# Patient Record
Sex: Female | Born: 1980 | Race: Black or African American | Hispanic: No | Marital: Single | Smoking: Current every day smoker
Health system: Southern US, Community
[De-identification: ages and names within clinical notes are randomized; demographics above are authoritative.]

## PROBLEM LIST (undated history)

## (undated) DIAGNOSIS — D649 Anemia, unspecified: Secondary | ICD-10-CM

## (undated) DIAGNOSIS — N390 Urinary tract infection, site not specified: Secondary | ICD-10-CM

## (undated) DIAGNOSIS — R87619 Unspecified abnormal cytological findings in specimens from cervix uteri: Secondary | ICD-10-CM

## (undated) DIAGNOSIS — B019 Varicella without complication: Secondary | ICD-10-CM

## (undated) HISTORY — DX: Varicella without complication: B01.9

## (undated) HISTORY — DX: Urinary tract infection, site not specified: N39.0

## (undated) HISTORY — PX: WISDOM TOOTH EXTRACTION: SHX21

## (undated) HISTORY — DX: Unspecified abnormal cytological findings in specimens from cervix uteri: R87.619

---
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
## 1999-07-29 ENCOUNTER — Inpatient Hospital Stay (HOSPITAL_COMMUNITY): Admission: AD | Admit: 1999-07-29 | Discharge: 1999-07-29 | Payer: Self-pay | Admitting: Obstetrics

## 2000-03-30 ENCOUNTER — Emergency Department (HOSPITAL_COMMUNITY): Admission: EM | Admit: 2000-03-30 | Discharge: 2000-03-30 | Payer: Self-pay | Admitting: *Deleted

## 2000-03-30 ENCOUNTER — Emergency Department (HOSPITAL_COMMUNITY): Admission: EM | Admit: 2000-03-30 | Discharge: 2000-03-30 | Payer: Self-pay | Admitting: Emergency Medicine

## 2000-05-24 ENCOUNTER — Emergency Department (HOSPITAL_COMMUNITY): Admission: EM | Admit: 2000-05-24 | Discharge: 2000-05-24 | Payer: Self-pay | Admitting: Emergency Medicine

## 2000-05-26 ENCOUNTER — Inpatient Hospital Stay (HOSPITAL_COMMUNITY): Admission: AD | Admit: 2000-05-26 | Discharge: 2000-05-26 | Payer: Self-pay | Admitting: *Deleted

## 2000-06-30 ENCOUNTER — Inpatient Hospital Stay (HOSPITAL_COMMUNITY): Admission: AD | Admit: 2000-06-30 | Discharge: 2000-06-30 | Payer: Self-pay | Admitting: Obstetrics and Gynecology

## 2000-07-02 ENCOUNTER — Encounter: Payer: Self-pay | Admitting: Obstetrics and Gynecology

## 2000-07-02 ENCOUNTER — Ambulatory Visit (HOSPITAL_COMMUNITY): Admission: RE | Admit: 2000-07-02 | Discharge: 2000-07-02 | Payer: Self-pay | Admitting: Obstetrics and Gynecology

## 2000-08-01 ENCOUNTER — Inpatient Hospital Stay (HOSPITAL_COMMUNITY): Admission: AD | Admit: 2000-08-01 | Discharge: 2000-08-01 | Payer: Self-pay | Admitting: Obstetrics and Gynecology

## 2000-08-21 ENCOUNTER — Encounter: Payer: Self-pay | Admitting: Obstetrics and Gynecology

## 2000-08-21 ENCOUNTER — Ambulatory Visit (HOSPITAL_COMMUNITY): Admission: RE | Admit: 2000-08-21 | Discharge: 2000-08-21 | Payer: Self-pay | Admitting: Obstetrics and Gynecology

## 2000-09-15 HISTORY — PX: DILATION AND CURETTAGE OF UTERUS: SHX78

## 2001-05-01 ENCOUNTER — Inpatient Hospital Stay (HOSPITAL_COMMUNITY): Admission: AD | Admit: 2001-05-01 | Discharge: 2001-05-01 | Payer: Self-pay | Admitting: Obstetrics and Gynecology

## 2001-05-10 ENCOUNTER — Inpatient Hospital Stay (HOSPITAL_COMMUNITY): Admission: AD | Admit: 2001-05-10 | Discharge: 2001-05-10 | Payer: Self-pay | Admitting: Obstetrics and Gynecology

## 2001-12-31 ENCOUNTER — Inpatient Hospital Stay (HOSPITAL_COMMUNITY): Admission: AD | Admit: 2001-12-31 | Discharge: 2002-01-02 | Payer: Self-pay | Admitting: Obstetrics and Gynecology

## 2005-08-15 ENCOUNTER — Emergency Department: Payer: Self-pay | Admitting: Unknown Physician Specialty

## 2005-08-16 ENCOUNTER — Emergency Department: Payer: Self-pay | Admitting: Emergency Medicine

## 2006-03-15 ENCOUNTER — Emergency Department: Payer: Self-pay | Admitting: Internal Medicine

## 2006-09-17 ENCOUNTER — Emergency Department (HOSPITAL_COMMUNITY): Admission: EM | Admit: 2006-09-17 | Discharge: 2006-09-17 | Payer: Self-pay | Admitting: Emergency Medicine

## 2006-10-15 ENCOUNTER — Emergency Department: Payer: Self-pay | Admitting: Emergency Medicine

## 2008-02-25 ENCOUNTER — Emergency Department (HOSPITAL_COMMUNITY): Admission: EM | Admit: 2008-02-25 | Discharge: 2008-02-25 | Payer: Self-pay | Admitting: Emergency Medicine

## 2008-05-30 ENCOUNTER — Emergency Department (HOSPITAL_COMMUNITY): Admission: EM | Admit: 2008-05-30 | Discharge: 2008-05-30 | Payer: Self-pay | Admitting: Emergency Medicine

## 2009-01-17 ENCOUNTER — Emergency Department (HOSPITAL_COMMUNITY): Admission: EM | Admit: 2009-01-17 | Discharge: 2009-01-17 | Payer: Self-pay | Admitting: Emergency Medicine

## 2009-04-11 ENCOUNTER — Emergency Department (HOSPITAL_COMMUNITY): Admission: EM | Admit: 2009-04-11 | Discharge: 2009-04-12 | Payer: Self-pay | Admitting: Emergency Medicine

## 2010-05-18 ENCOUNTER — Emergency Department (HOSPITAL_COMMUNITY): Admission: EM | Admit: 2010-05-18 | Discharge: 2010-05-18 | Payer: Self-pay | Admitting: Emergency Medicine

## 2010-11-28 LAB — CBC
MCH: 23.4 pg — ABNORMAL LOW (ref 26.0–34.0)
Platelets: 365 10*3/uL (ref 150–400)
RBC: 4.7 MIL/uL (ref 3.87–5.11)
RDW: 15.4 % (ref 11.5–15.5)

## 2010-11-28 LAB — GC/CHLAMYDIA PROBE AMP, GENITAL
Chlamydia, DNA Probe: NEGATIVE
GC Probe Amp, Genital: NEGATIVE

## 2010-11-28 LAB — URINALYSIS, ROUTINE W REFLEX MICROSCOPIC
Bilirubin Urine: NEGATIVE
Glucose, UA: NEGATIVE mg/dL
Ketones, ur: NEGATIVE mg/dL
Nitrite: NEGATIVE
Protein, ur: NEGATIVE mg/dL
Specific Gravity, Urine: 1.024 (ref 1.005–1.030)
Urobilinogen, UA: 0.2 mg/dL (ref 0.0–1.0)
pH: 5.5 (ref 5.0–8.0)

## 2010-11-28 LAB — URINE MICROSCOPIC-ADD ON

## 2010-11-28 LAB — DIFFERENTIAL
Basophils Absolute: 0.1 10*3/uL (ref 0.0–0.1)
Basophils Relative: 1 % (ref 0–1)
Eosinophils Absolute: 0.6 10*3/uL (ref 0.0–0.7)
Lymphs Abs: 2.8 10*3/uL (ref 0.7–4.0)
Neutrophils Relative %: 62 % (ref 43–77)

## 2010-11-28 LAB — WET PREP, GENITAL
Trich, Wet Prep: NONE SEEN
Yeast Wet Prep HPF POC: NONE SEEN

## 2010-11-28 LAB — URINE CULTURE: Culture  Setup Time: 201109041144

## 2010-12-22 LAB — URINE MICROSCOPIC-ADD ON

## 2010-12-22 LAB — URINALYSIS, ROUTINE W REFLEX MICROSCOPIC
Glucose, UA: NEGATIVE mg/dL
Ketones, ur: NEGATIVE mg/dL
Protein, ur: 30 mg/dL — AB
pH: 5.5 (ref 5.0–8.0)

## 2010-12-22 LAB — DIFFERENTIAL
Basophils Relative: 0 % (ref 0–1)
Eosinophils Absolute: 0.4 10*3/uL (ref 0.0–0.7)
Lymphs Abs: 1.7 10*3/uL (ref 0.7–4.0)
Monocytes Absolute: 0.4 10*3/uL (ref 0.1–1.0)
Monocytes Relative: 6 % (ref 3–12)

## 2010-12-22 LAB — CBC
Hemoglobin: 13.1 g/dL (ref 12.0–15.0)
MCHC: 33.1 g/dL (ref 30.0–36.0)
MCV: 83.6 fL (ref 78.0–100.0)
RBC: 4.74 MIL/uL (ref 3.87–5.11)
WBC: 6.1 10*3/uL (ref 4.0–10.5)

## 2010-12-22 LAB — BASIC METABOLIC PANEL
CO2: 26 mEq/L (ref 19–32)
Chloride: 107 mEq/L (ref 96–112)
GFR calc Af Amer: >60 mL/min (ref >60)
Potassium: 3.6 mEq/L (ref 3.5–5.1)
Sodium: 137 mEq/L (ref 135–145)

## 2010-12-24 LAB — URINALYSIS, ROUTINE W REFLEX MICROSCOPIC
Bilirubin Urine: NEGATIVE
Ketones, ur: 15 mg/dL — AB
Leukocytes, UA: NEGATIVE
Nitrite: NEGATIVE
Protein, ur: 30 mg/dL — AB
Urobilinogen, UA: 0.2 mg/dL (ref 0.0–1.0)

## 2010-12-24 LAB — GC/CHLAMYDIA PROBE AMP, GENITAL: GC Probe Amp, Genital: NEGATIVE

## 2010-12-24 LAB — WET PREP, GENITAL
Trich, Wet Prep: NONE SEEN
Yeast Wet Prep HPF POC: NONE SEEN

## 2010-12-24 LAB — POCT I-STAT, CHEM 8
Chloride: 108 mEq/L (ref 96–112)
Creatinine, Ser: 0.8 mg/dL (ref 0.4–1.2)
HCT: 38 % (ref 36.0–46.0)
Hemoglobin: 12.9 g/dL (ref 12.0–15.0)
Potassium: 3.5 mEq/L (ref 3.5–5.1)
Sodium: 143 mEq/L (ref 135–145)

## 2010-12-24 LAB — PROTIME-INR
INR: 1.1 (ref 0.00–1.49)
Prothrombin Time: 14.2 seconds (ref 11.6–15.2)

## 2010-12-24 LAB — CBC
HCT: 39.4 % (ref 36.0–46.0)
Platelets: 273 10*3/uL (ref 150–400)
RDW: 15.6 % — ABNORMAL HIGH (ref 11.5–15.5)
WBC: 8.7 10*3/uL (ref 4.0–10.5)

## 2010-12-24 LAB — APTT: aPTT: 27 seconds (ref 24–37)

## 2010-12-24 LAB — HCG, QUANTITATIVE, PREGNANCY: hCG, Beta Chain, Quant, S: <2 m[IU]/mL (ref <5)

## 2010-12-24 LAB — POCT PREGNANCY, URINE: Preg Test, Ur: NEGATIVE

## 2010-12-24 LAB — TYPE AND SCREEN: Antibody Screen: NEGATIVE

## 2010-12-24 LAB — DIFFERENTIAL
Basophils Absolute: 0.1 10*3/uL (ref 0.0–0.1)
Lymphocytes Relative: 18 % (ref 12–46)
Lymphs Abs: 1.6 10*3/uL (ref 0.7–4.0)
Neutro Abs: 6.2 10*3/uL (ref 1.7–7.7)

## 2010-12-24 LAB — URINE MICROSCOPIC-ADD ON

## 2011-01-31 NOTE — H&P (Signed)
[facility]  Patient:    Virginia Bird, Virginia Bird Visit Number: [id number] MRN: [id number]          Service Type: OBS Location: [id number] Attending Physician:  Michael Litter Dictated by:   Wynelle Bourgeois, CNM Admit Date:  12/31/2001                           History and Physical  HISTORY OF PRESENT ILLNESS:   This is a 30 year old gravida 2, para 0-0-1-0, who presents at [redacted] weeks gestation with uterine contractions reportedly every six minutes for about two hours.  She denies leaking or bleeding and reports positive fetal movement.  Pregnancy has been followed by the MD service and remarkable for history of a 17 week loss and first trimester bleeding.  OB HISTORY:                   Remarkable for a pregnancy in 2001 at 17 weeks which was a twin gestation where the first twin died in utero in the first trimester and the second twin died at 78 weeks and delivered spontaneously later.  She has no other pregnancy history.  PRENATAL LABORATORY DATA:     Blood type B-positive.  Antibody screen negative.  Hemoglobin 10.7.  Sickle cell negative.  RPR nonreactive.  Rubella immune.  HBsAg negative.  Pap test normal.  Gonorrhea negative.  Chlamydia negative.  Glucose challenge within normal limits.  Group B strep positive.  PAST MEDICAL HISTORY:         Remarkable for history of Chlamydia in 2000, which was treated.  History of childhood Varicella.  FAMILY HISTORY:               Remarkable for maternal grandmother with hypertension.  Father with diabetes.  Mother with thyroid dysfunction and father with leukemia.  GENETIC HISTORY:              Remarkable for loss of a 17 week twin pregnancy.  SOCIAL HISTORY:               The patient is single.  The patient is accompanied by a female tonight.  Father of the baby is not present at this time.  She denies any alcohol, tobacco, or drug use.  PHYSICAL EXAMINATION:  VITAL SIGNS:                  Stable  and afebrile.  HEENT:                        Within normal limits.  NECK:                         Thyroid normal, not enlarged.  BREASTS:                      Soft and nontender.  CHEST:                        Clear to auscultation.  HEART:                        Regular rate and rhythm.  ABDOMEN:                      Gravid and 40 cm, vertex to [place].  EFM showed  an initial fetal heart rate of 80 upon admission x1 minute which later resolved to the 130s with small accelerations to 140s and uterine contractions every two to three minutes.  Some mild and some stronger.  CERVICAL EXAMINATION:         Three to four centimeters, 80% effaced, -1 station, with a vertex presentation.  EXTREMITIES:                  Within normal limits.  ASSESSMENT:                   1. Intrauterine pregnancy at term.                               2. Early active labor.                               3. Group B streptococcus positive.                               4. Variable deceleration.  PLAN:                         1. Admit to birthing suite, Dr. Normand Sloop                                  notified.                               2. Routine MD orders.                               3. Group B strep prophylaxis. Dictated by:   Wynelle Bourgeois, CNM Attending Physician:  Michael Litter DD:  12/31/01 TD:  12/31/01 Job: [id number]

## 2011-01-31 NOTE — H&P (Signed)
[facility]  Patient:    Virginia Bird, Virginia Bird                   MRN: [id number] Adm. Date:  [id number] Disc. Date: [id number] Attending:  Maxie Better                         History and Physical  HISTORY OF PRESENT ILLNESS:   The patient is a 30 year old female, gravida 1, para 0, who presents at 19 weeks and 5 days gestation (EDC is Jan 26, 2001) based on her last menstrual period.  The patient is known to have twins.  She has had two ultrasounds which confirm intrauterine fetal demise of both twins.  PAST MEDICAL HISTORY:         The patient denies hypertension and diabetes.  DRUG ALLERGIES:               None.  SOCIAL HISTORY:               The patient is single, and she works for [employer].  She denies cigarette use, alcohol use, and recreational drug use.  FAMILY HISTORY:               The patients father has diabetes.  The patients mother had thyroid dysfunction.  The patients father had leukemia.  REVIEW OF SYSTEMS:            Noncontributory.  PHYSICAL EXAMINATION:  HEENT:                        Within normal limits.  CHEST:                        Clear.  HEART:                        Regular rate and rhythm.  BREASTS:                      Without masses.  ABDOMEN:                      Nontender.  EXTREMITIES:                  Within normal limits.  NEUROLOGIC:                   Normal.  PELVIC:                       External genitalia is normal.  The vagina is normal.  Cervix is nontender.  Uterus is 16 to 18-week size and nontender. Adnexa: No masses appreciated.  ASSESSMENT:                   Intrauterine pregnancy fetal demise of a twin gestation measuring approximately 15-1/2 weeks with each twin.  PLAN:                         The patient will come in for prostaglandin induction of delivery.  She understands the indications for her procedure, and she accepts the risks. DD:  08/28/00 TD:  08/28/00 Job:  [id number]

## 2011-06-16 LAB — URINALYSIS, ROUTINE W REFLEX MICROSCOPIC
Bilirubin Urine: NEGATIVE
Glucose, UA: NEGATIVE
Ketones, ur: NEGATIVE
pH: 5.5

## 2011-06-16 LAB — URINE MICROSCOPIC-ADD ON

## 2011-06-16 LAB — POCT PREGNANCY, URINE: Preg Test, Ur: NEGATIVE

## 2012-01-27 ENCOUNTER — Encounter: Payer: Self-pay | Admitting: Family Medicine

## 2012-01-27 ENCOUNTER — Other Ambulatory Visit (HOSPITAL_COMMUNITY)
Admission: RE | Admit: 2012-01-27 | Discharge: 2012-01-27 | Disposition: A | Discharge status: Home / Self Care | Payer: Medicaid Other | Source: Ambulatory Visit | Attending: Family Medicine | Admitting: Family Medicine

## 2012-01-27 ENCOUNTER — Ambulatory Visit (INDEPENDENT_AMBULATORY_CARE_PROVIDER_SITE_OTHER): Payer: Medicaid Other | Admitting: Family Medicine

## 2012-01-27 VITALS — BP 114/83 | HR 110 | Ht 61.0 in | Wt 155.0 lb

## 2012-01-27 DIAGNOSIS — R87810 Cervical high risk human papillomavirus (HPV) DNA test positive: Secondary | ICD-10-CM | POA: Insufficient documentation

## 2012-01-27 DIAGNOSIS — Z01419 Encounter for gynecological examination (general) (routine) without abnormal findings: Secondary | ICD-10-CM | POA: Insufficient documentation

## 2012-01-27 DIAGNOSIS — N83209 Unspecified ovarian cyst, unspecified side: Secondary | ICD-10-CM | POA: Insufficient documentation

## 2012-01-27 DIAGNOSIS — N852 Hypertrophy of uterus: Secondary | ICD-10-CM

## 2012-01-27 MED ORDER — NORETHINDRONE-ETH ESTRADIOL 0.4-35 MG-MCG PO TABS: 1.0000 | ORAL_TABLET | Freq: Every day | ORAL | Status: DC

## 2012-01-27 NOTE — Progress Notes (Signed)
  Subjective:     Virginia Bird is a 31 y.o. female and is here for a comprehensive physical exam. The patient reports problems - reports ? ovarian cysts with abd. pain and swelling.  Also in need of contraception.  Reports normal monthly cycles..  History   Social History  . Marital Status: Single    Spouse Name: N/A    Number of Children: N/A  . Years of Education: N/A   Occupational History  . Not on file.   Social History Main Topics  . Smoking status: Current Everyday Smoker -- 0.5 packs/day for 10 years  . Smokeless tobacco: Never Used   Comment: Has not tried any quit aids/tn  . Alcohol Use: Yes     weekends  . Drug Use: No  . Sexually Active: Yes -- Female partner(s)    Birth Control/ Protection: Condom   Other Topics Concern  . Not on file   Social History Narrative  . No narrative on file   No health maintenance topics applied.  The following portions of the patient's history were reviewed and updated as appropriate: allergies, current medications, past family history, past medical history, past social history, past surgical history and problem list.  Review of Systems A comprehensive review of systems was negative.   Objective:    BP 114/83  Pulse 110  Ht 5\' 1"  (1.549 m)  Wt 155 lb (70.308 kg)  BMI 29.29 kg/m2  LMP 01/19/2012 General appearance: alert and cooperative Head: Normocephalic, without obvious abnormality, atraumatic Neck: no adenopathy, supple, symmetrical, trachea midline and thyroid not enlarged, symmetric, no tenderness/mass/nodules Lungs: clear to auscultation bilaterally Breasts: normal appearance, no masses or tenderness Heart: regular rate and rhythm, S1, S2 normal, no murmur, click, rub or gallop Abdomen: soft, non-tender; bowel sounds normal; no masses,  no organomegaly Pelvic: cervix normal in appearance, external genitalia normal, no adnexal masses or tenderness, no cervical motion tenderness, vagina normal without discharge and  uterus enlarged and firm approx. 10-12 wk size Extremities: extremities normal, atraumatic, no cyanosis or edema Pulses: 2+ and symmetric Skin: Skin color, texture, turgor normal. No rashes or lesions Lymph nodes: Cervical, supraclavicular, and axillary nodes normal. Neurologic: Grossly normal    Assessment:    Healthy female exam. Enlarged uterus, needs contraception, h/o ovarian cysts     Plan:  Schedule u/s to look at uterus, given size. Ovcon for contraception and control of cysts. Pap smear today  See After Visit Summary for Counseling Recommendations

## 2012-01-27 NOTE — Progress Notes (Signed)
Patient is here to for yearly exam and would like to talk about ovarian cysts.

## 2012-01-27 NOTE — Patient Instructions (Signed)
Preventive Care for Adults, Female A healthy lifestyle and preventive care can promote health and wellness. Preventive health guidelines for women include the following key practices.  A routine yearly physical is a good way to check with your caregiver about your health and preventive screening. It is a chance to share any concerns and updates on your health, and to receive a thorough exam.   Visit your dentist for a routine exam and preventive care every 6 months. Brush your teeth twice a day and floss once a day. Good oral hygiene prevents tooth decay and gum disease.   The frequency of eye exams is based on your age, health, family medical history, use of contact lenses, and other factors. Follow your caregiver's recommendations for frequency of eye exams.   Eat a healthy diet. Foods like vegetables, fruits, whole grains, low-fat dairy products, and lean protein foods contain the nutrients you need without too many calories. Decrease your intake of foods high in solid fats, added sugars, and salt. Eat the right amount of calories for you.Get information about a proper diet from your caregiver, if necessary.   Regular physical exercise is one of the most important things you can do for your health. Most adults should get at least 150 minutes of moderate-intensity exercise (any activity that increases your heart rate and causes you to sweat) each week. In addition, most adults need muscle-strengthening exercises on 2 or more days a week.   Maintain a healthy weight. The body mass index (BMI) is a screening tool to identify possible weight problems. It provides an estimate of body fat based on height and weight. Your caregiver can help determine your BMI, and can help you achieve or maintain a healthy weight.For adults 20 years and older:   A BMI below 18.5 is considered underweight.   A BMI of 18.5 to 24.9 is normal.   A BMI of 25 to 29.9 is considered overweight.   A BMI of 30 and above is  considered obese.   Maintain normal blood lipids and cholesterol levels by exercising and minimizing your intake of saturated fat. Eat a balanced diet with plenty of fruit and vegetables. Blood tests for lipids and cholesterol should begin at age 20 and be repeated every 5 years. If your lipid or cholesterol levels are high, you are over 50, or you are at high risk for heart disease, you may need your cholesterol levels checked more frequently.Ongoing high lipid and cholesterol levels should be treated with medicines if diet and exercise are not effective.   If you smoke, find out from your caregiver how to quit. If you do not use tobacco, do not start.   If you are pregnant, do not drink alcohol. If you are breastfeeding, be very cautious about drinking alcohol. If you are not pregnant and choose to drink alcohol, do not exceed 1 drink per day. One drink is considered to be 12 ounces (355 mL) of beer, 5 ounces (148 mL) of wine, or 1.5 ounces (44 mL) of liquor.   Avoid use of street drugs. Do not share needles with anyone. Ask for help if you need support or instructions about stopping the use of drugs.   High blood pressure causes heart disease and increases the risk of stroke. Your blood pressure should be checked at least every 1 to 2 years. Ongoing high blood pressure should be treated with medicines if weight loss and exercise are not effective.   If you are 55 to 33   years old, ask your caregiver if you should take aspirin to prevent strokes.   Diabetes screening involves taking a blood sample to check your fasting blood sugar level. This should be done once every 3 years, after age 45, if you are within normal weight and without risk factors for diabetes. Testing should be considered at a younger age or be carried out more frequently if you are overweight and have at least 1 risk factor for diabetes.   Breast cancer screening is essential preventive care for women. You should practice "breast  self-awareness." This means understanding the normal appearance and feel of your breasts and may include breast self-examination. Any changes detected, no matter how small, should be reported to a caregiver. Women in their 20s and 30s should have a clinical breast exam (CBE) by a caregiver as part of a regular health exam every 1 to 3 years. After age 40, women should have a CBE every year. Starting at age 40, women should consider having a mammography (breast X-ray test) every year. Women who have a family history of breast cancer should talk to their caregiver about genetic screening. Women at a high risk of breast cancer should talk to their caregivers about having magnetic resonance imaging (MRI) and a mammography every year.   The Pap test is a screening test for cervical cancer. A Pap test can show cell changes on the cervix that might become cervical cancer if left untreated. A Pap test is a procedure in which cells are obtained and examined from the lower end of the uterus (cervix).   Women should have a Pap test starting at age 21.   Between ages 21 and 29, Pap tests should be repeated every 2 years.   Beginning at age 30, you should have a Pap test every 3 years as long as the past 3 Pap tests have been normal.   Some women have medical problems that increase the chance of getting cervical cancer. Talk to your caregiver about these problems. It is especially important to talk to your caregiver if a new problem develops soon after your last Pap test. In these cases, your caregiver may recommend more frequent screening and Pap tests.   The above recommendations are the same for women who have or have not gotten the vaccine for human papillomavirus (HPV).   If you had a hysterectomy for a problem that was not cancer or a condition that could lead to cancer, then you no longer need Pap tests. Even if you no longer need a Pap test, a regular exam is a good idea to make sure no other problems are  starting.   If you are between ages 65 and 70, and you have had normal Pap tests going back 10 years, you no longer need Pap tests. Even if you no longer need a Pap test, a regular exam is a good idea to make sure no other problems are starting.   If you have had past treatment for cervical cancer or a condition that could lead to cancer, you need Pap tests and screening for cancer for at least 20 years after your treatment.   If Pap tests have been discontinued, risk factors (such as a new sexual partner) need to be reassessed to determine if screening should be resumed.   The HPV test is an additional test that may be used for cervical cancer screening. The HPV test looks for the virus that can cause the cell changes on the cervix.   The cells collected during the Pap test can be tested for HPV. The HPV test could be used to screen women aged 30 years and older, and should be used in women of any age who have unclear Pap test results. After the age of 30, women should have HPV testing at the same frequency as a Pap test.   Colorectal cancer can be detected and often prevented. Most routine colorectal cancer screening begins at the age of 50 and continues through age 75. However, your caregiver may recommend screening at an earlier age if you have risk factors for colon cancer. On a yearly basis, your caregiver may provide home test kits to check for hidden blood in the stool. Use of a small camera at the end of a tube, to directly examine the colon (sigmoidoscopy or colonoscopy), can detect the earliest forms of colorectal cancer. Talk to your caregiver about this at age 50, when routine screening begins. Direct examination of the colon should be repeated every 5 to 10 years through age 75, unless early forms of pre-cancerous polyps or small growths are found.   Hepatitis C blood testing is recommended for all people born from 1945 through 1965 and any individual with known risks for hepatitis C.    Practice safe sex. Use condoms and avoid high-risk sexual practices to reduce the spread of sexually transmitted infections (STIs). STIs include gonorrhea, chlamydia, syphilis, trichomonas, herpes, HPV, and human immunodeficiency virus (HIV). Herpes, HIV, and HPV are viral illnesses that have no cure. They can result in disability, cancer, and death. Sexually active women aged 25 and younger should be checked for chlamydia. Older women with new or multiple partners should also be tested for chlamydia. Testing for other STIs is recommended if you are sexually active and at increased risk.   Osteoporosis is a disease in which the bones lose minerals and strength with aging. This can result in serious bone fractures. The risk of osteoporosis can be identified using a bone density scan. Women ages 65 and over and women at risk for fractures or osteoporosis should discuss screening with their caregivers. Ask your caregiver whether you should take a calcium supplement or vitamin D to reduce the rate of osteoporosis.   Menopause can be associated with physical symptoms and risks. Hormone replacement therapy is available to decrease symptoms and risks. You should talk to your caregiver about whether hormone replacement therapy is right for you.   Use sunscreen with sun protection factor (SPF) of 30 or more. Apply sunscreen liberally and repeatedly throughout the day. You should seek shade when your shadow is shorter than you. Protect yourself by wearing long sleeves, pants, a wide-brimmed hat, and sunglasses year round, whenever you are outdoors.   Once a month, do a whole body skin exam, using a mirror to look at the skin on your back. Notify your caregiver of new moles, moles that have irregular borders, moles that are larger than a pencil eraser, or moles that have changed in shape or color.   Stay current with required immunizations.   Influenza. You need a dose every fall (or winter). The composition of  the flu vaccine changes each year, so being vaccinated once is not enough.   Pneumococcal polysaccharide. You need 1 to 2 doses if you smoke cigarettes or if you have certain chronic medical conditions. You need 1 dose at age 65 (or older) if you have never been vaccinated.   Tetanus, diphtheria, pertussis (Tdap, Td). Get 1 dose of   Tdap vaccine if you are younger than age 65, are over 65 and have contact with an infant, are a healthcare worker, are pregnant, or simply want to be protected from whooping cough. After that, you need a Td booster dose every 10 years. Consult your caregiver if you have not had at least 3 tetanus and diphtheria-containing shots sometime in your life or have a deep or dirty wound.   HPV. You need this vaccine if you are a woman age 26 or younger. The vaccine is given in 3 doses over 6 months.   Measles, mumps, rubella (MMR). You need at least 1 dose of MMR if you were born in 1957 or later. You may also need a second dose.   Meningococcal. If you are age 19 to 21 and a first-year college student living in a residence hall, or have one of several medical conditions, you need to get vaccinated against meningococcal disease. You may also need additional booster doses.   Zoster (shingles). If you are age 60 or older, you should get this vaccine.   Varicella (chickenpox). If you have never had chickenpox or you were vaccinated but received only 1 dose, talk to your caregiver to find out if you need this vaccine.   Hepatitis A. You need this vaccine if you have a specific risk factor for hepatitis A virus infection or you simply wish to be protected from this disease. The vaccine is usually given as 2 doses, 6 to 18 months apart.   Hepatitis B. You need this vaccine if you have a specific risk factor for hepatitis B virus infection or you simply wish to be protected from this disease. The vaccine is given in 3 doses, usually over 6 months.  Preventive Services /  Frequency Ages 19 to 39  Blood pressure check.** / Every 1 to 2 years.   Lipid and cholesterol check.** / Every 5 years beginning at age 20.   Clinical breast exam.** / Every 3 years for women in their 20s and 30s.   Pap test.** / Every 2 years from ages 21 through 29. Every 3 years starting at age 30 through age 65 or 70 with a history of 3 consecutive normal Pap tests.   HPV screening.** / Every 3 years from ages 30 through ages 65 to 70 with a history of 3 consecutive normal Pap tests.   Hepatitis C blood test.** / For any individual with known risks for hepatitis C.   Skin self-exam. / Monthly.   Influenza immunization.** / Every year.   Pneumococcal polysaccharide immunization.** / 1 to 2 doses if you smoke cigarettes or if you have certain chronic medical conditions.   Tetanus, diphtheria, pertussis (Tdap, Td) immunization. / A one-time dose of Tdap vaccine. After that, you need a Td booster dose every 10 years.   HPV immunization. / 3 doses over 6 months, if you are 26 and younger.   Measles, mumps, rubella (MMR) immunization. / You need at least 1 dose of MMR if you were born in 1957 or later. You may also need a second dose.   Meningococcal immunization. / 1 dose if you are age 19 to 21 and a first-year college student living in a residence hall, or have one of several medical conditions, you need to get vaccinated against meningococcal disease. You may also need additional booster doses.   Varicella immunization.** / Consult your caregiver.   Hepatitis A immunization.** / Consult your caregiver. 2 doses, 6 to 18 months   apart.   Hepatitis B immunization.** / Consult your caregiver. 3 doses usually over 6 months.  Ages 40 to 64  Blood pressure check.** / Every 1 to 2 years.   Lipid and cholesterol check.** / Every 5 years beginning at age 20.   Clinical breast exam.** / Every year after age 40.   Mammogram.** / Every year beginning at age 40 and continuing for as  long as you are in good health. Consult with your caregiver.   Pap test.** / Every 3 years starting at age 30 through age 65 or 70 with a history of 3 consecutive normal Pap tests.   HPV screening.** / Every 3 years from ages 30 through ages 65 to 70 with a history of 3 consecutive normal Pap tests.   Fecal occult blood test (FOBT) of stool. / Every year beginning at age 50 and continuing until age 75. You may not need to do this test if you get a colonoscopy every 10 years.   Flexible sigmoidoscopy or colonoscopy.** / Every 5 years for a flexible sigmoidoscopy or every 10 years for a colonoscopy beginning at age 50 and continuing until age 75.   Hepatitis C blood test.** / For all people born from 1945 through 1965 and any individual with known risks for hepatitis C.   Skin self-exam. / Monthly.   Influenza immunization.** / Every year.   Pneumococcal polysaccharide immunization.** / 1 to 2 doses if you smoke cigarettes or if you have certain chronic medical conditions.   Tetanus, diphtheria, pertussis (Tdap, Td) immunization.** / A one-time dose of Tdap vaccine. After that, you need a Td booster dose every 10 years.   Measles, mumps, rubella (MMR) immunization. / You need at least 1 dose of MMR if you were born in 1957 or later. You may also need a second dose.   Varicella immunization.** / Consult your caregiver.   Meningococcal immunization.** / Consult your caregiver.   Hepatitis A immunization.** / Consult your caregiver. 2 doses, 6 to 18 months apart.   Hepatitis B immunization.** / Consult your caregiver. 3 doses, usually over 6 months.  Ages 65 and over  Blood pressure check.** / Every 1 to 2 years.   Lipid and cholesterol check.** / Every 5 years beginning at age 20.   Clinical breast exam.** / Every year after age 40.   Mammogram.** / Every year beginning at age 40 and continuing for as long as you are in good health. Consult with your caregiver.   Pap test.** /  Every 3 years starting at age 30 through age 65 or 70 with a 3 consecutive normal Pap tests. Testing can be stopped between 65 and 70 with 3 consecutive normal Pap tests and no abnormal Pap or HPV tests in the past 10 years.   HPV screening.** / Every 3 years from ages 30 through ages 65 or 70 with a history of 3 consecutive normal Pap tests. Testing can be stopped between 65 and 70 with 3 consecutive normal Pap tests and no abnormal Pap or HPV tests in the past 10 years.   Fecal occult blood test (FOBT) of stool. / Every year beginning at age 50 and continuing until age 75. You may not need to do this test if you get a colonoscopy every 10 years.   Flexible sigmoidoscopy or colonoscopy.** / Every 5 years for a flexible sigmoidoscopy or every 10 years for a colonoscopy beginning at age 50 and continuing until age 75.   Hepatitis   C blood test.** / For all people born from 1945 through 1965 and any individual with known risks for hepatitis C.   Osteoporosis screening.** / A one-time screening for women ages 65 and over and women at risk for fractures or osteoporosis.   Skin self-exam. / Monthly.   Influenza immunization.** / Every year.   Pneumococcal polysaccharide immunization.** / 1 dose at age 65 (or older) if you have never been vaccinated.   Tetanus, diphtheria, pertussis (Tdap, Td) immunization. / A one-time dose of Tdap vaccine if you are over 65 and have contact with an infant, are a healthcare worker, or simply want to be protected from whooping cough. After that, you need a Td booster dose every 10 years.   Varicella immunization.** / Consult your caregiver.   Meningococcal immunization.** / Consult your caregiver.   Hepatitis A immunization.** / Consult your caregiver. 2 doses, 6 to 18 months apart.   Hepatitis B immunization.** / Check with your caregiver. 3 doses, usually over 6 months.  ** Family history and personal history of risk and conditions may change your caregiver's  recommendations. Document Released: 10/28/2001 Document Revised: 08/21/2011 Document Reviewed: 01/27/2011 Exit[employer] Patient Information 2012 [employer]. 

## 2012-01-29 ENCOUNTER — Other Ambulatory Visit: Payer: Self-pay | Admitting: Family Medicine

## 2012-01-29 DIAGNOSIS — N852 Hypertrophy of uterus: Secondary | ICD-10-CM

## 2012-02-05 ENCOUNTER — Ambulatory Visit (HOSPITAL_COMMUNITY)
Admission: RE | Admit: 2012-02-05 | Discharge: 2012-02-05 | Disposition: A | Discharge status: Home / Self Care | Payer: Medicaid Other | Source: Ambulatory Visit | Attending: Family Medicine | Admitting: Family Medicine

## 2012-02-05 DIAGNOSIS — D251 Intramural leiomyoma of uterus: Secondary | ICD-10-CM | POA: Insufficient documentation

## 2012-02-05 DIAGNOSIS — N852 Hypertrophy of uterus: Secondary | ICD-10-CM | POA: Insufficient documentation

## 2012-02-25 ENCOUNTER — Ambulatory Visit (INDEPENDENT_AMBULATORY_CARE_PROVIDER_SITE_OTHER): Payer: Medicaid Other | Admitting: Obstetrics and Gynecology

## 2012-02-25 ENCOUNTER — Encounter: Payer: Self-pay | Admitting: Obstetrics and Gynecology

## 2012-02-25 VITALS — BP 116/78 | HR 86 | Ht 62.0 in | Wt 154.0 lb

## 2012-02-25 DIAGNOSIS — IMO0001 Reserved for inherently not codable concepts without codable children: Secondary | ICD-10-CM | POA: Insufficient documentation

## 2012-02-25 DIAGNOSIS — Z01812 Encounter for preprocedural laboratory examination: Secondary | ICD-10-CM

## 2012-02-25 DIAGNOSIS — Z0000 Encounter for general adult medical examination without abnormal findings: Secondary | ICD-10-CM

## 2012-02-25 DIAGNOSIS — Z01419 Encounter for gynecological examination (general) (routine) without abnormal findings: Secondary | ICD-10-CM

## 2012-02-25 DIAGNOSIS — Z113 Encounter for screening for infections with a predominantly sexual mode of transmission: Secondary | ICD-10-CM

## 2012-02-25 DIAGNOSIS — R87610 Atypical squamous cells of undetermined significance on cytologic smear of cervix (ASC-US): Secondary | ICD-10-CM

## 2012-02-25 NOTE — Patient Instructions (Signed)
Colposcopy Care After Colposcopy is a procedure in which a special tool is used to magnify the surface of the cervix. A tissue sample (biopsy) may also be taken. This sample will be looked at for cervical cancer or other problems. After the test:  You may have some cramping.   Lie down for a few minutes if you feel lightheaded.    You may have some bleeding which should stop in a few days.  HOME CARE  Do not have sex or use tampons for 2 to 3 days or as told.   Only take medicine as told by your doctor.   Continue to take your birth control pills as usual.  Finding out the results of your test Ask when your test results will be ready. Make sure you get your test results. GET HELP RIGHT AWAY IF:  You are bleeding a lot or are passing blood clots.   You develop a fever of 100.4 F (38 C) or higher.   You have abnormal vaginal discharge.   You have cramps that do not go away with medicine.   You feel lightheaded, dizzy, or pass out (faint).  MAKE SURE YOU:   Understand these instructions.   Will watch your condition.   Will get help right away if you are not doing well or get worse.  Document Released: 02/18/2008 Document Revised: 08/21/2011 Document Reviewed: 02/18/2008 Exit[employer] Patient Information 2012 [employer]. 

## 2012-02-25 NOTE — Addendum Note (Signed)
Addended by: Barbara Cower on: 02/25/2012 04:56 PM   Modules accepted: Orders

## 2012-02-25 NOTE — Progress Notes (Signed)
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  Placed in lithotomy position. Cervix viewed with speculum and colposcope after application of acetic acid.   Colposcopy adequate?  yes Acetowhite lesions?yes at 12-1 o'clock position Punctation?no Mosaicism?  no Abnormal vasculature?  no Biopsies?yes at 12 o'clock ECC?yes  COMMENTS: Patient was given post procedure instructions.  She will return in 2 weeks for results.

## 2012-02-26 LAB — COMPREHENSIVE METABOLIC PANEL
ALT: 10 U/L (ref 0–35)
AST: 17 U/L (ref 0–37)
Alkaline Phosphatase: 44 U/L (ref 39–117)
Calcium: 9.4 mg/dL (ref 8.4–10.5)
Chloride: 104 mEq/L (ref 96–112)
Creat: 0.8 mg/dL (ref 0.50–1.10)
Potassium: 3.8 mEq/L (ref 3.5–5.3)

## 2012-02-26 LAB — HEPATITIS B SURFACE ANTIGEN: Hepatitis B Surface Ag: NEGATIVE

## 2012-02-26 LAB — LIPID PANEL
HDL: 51 mg/dL (ref >39)
LDL Cholesterol: 58 mg/dL (ref 0–99)
Total CHOL/HDL Ratio: 2.6 Ratio
VLDL: 24 mg/dL (ref 0–40)

## 2012-02-26 LAB — CBC
MCV: 72.7 fL — ABNORMAL LOW (ref 78.0–100.0)
Platelets: 440 10*3/uL — ABNORMAL HIGH (ref 150–400)
RDW: 16.7 % — ABNORMAL HIGH (ref 11.5–15.5)
WBC: 9 10*3/uL (ref 4.0–10.5)

## 2012-02-26 LAB — HIV ANTIBODY (ROUTINE TESTING W REFLEX): HIV: NONREACTIVE

## 2012-03-10 ENCOUNTER — Telehealth: Payer: Self-pay | Admitting: Gynecology

## 2012-03-10 NOTE — Telephone Encounter (Signed)
Spoke to patient regarding her labs result. Patient is aware of result and will start taking iron supplement and recheck her labs in 3 months.

## 2013-07-18 ENCOUNTER — Ambulatory Visit: Payer: Medicaid Other | Admitting: Family Medicine

## 2014-07-17 ENCOUNTER — Encounter: Payer: Self-pay | Admitting: Obstetrics and Gynecology

## 2014-07-19 ENCOUNTER — Ambulatory Visit (INDEPENDENT_AMBULATORY_CARE_PROVIDER_SITE_OTHER): Payer: Medicaid Other | Admitting: Obstetrics & Gynecology

## 2014-07-19 ENCOUNTER — Other Ambulatory Visit (HOSPITAL_COMMUNITY)
Admission: RE | Admit: 2014-07-19 | Discharge: 2014-07-19 | Disposition: A | Discharge status: Home / Self Care | Payer: Medicaid Other | Source: Ambulatory Visit | Attending: Obstetrics & Gynecology | Admitting: Obstetrics & Gynecology

## 2014-07-19 ENCOUNTER — Encounter: Payer: Self-pay | Admitting: Obstetrics & Gynecology

## 2014-07-19 VITALS — BP 109/77 | HR 76 | Ht 61.0 in | Wt 153.0 lb

## 2014-07-19 DIAGNOSIS — Z01411 Encounter for gynecological examination (general) (routine) with abnormal findings: Secondary | ICD-10-CM | POA: Diagnosis present

## 2014-07-19 DIAGNOSIS — Z1329 Encounter for screening for other suspected endocrine disorder: Secondary | ICD-10-CM

## 2014-07-19 DIAGNOSIS — R87810 Cervical high risk human papillomavirus (HPV) DNA test positive: Secondary | ICD-10-CM | POA: Insufficient documentation

## 2014-07-19 DIAGNOSIS — Z113 Encounter for screening for infections with a predominantly sexual mode of transmission: Secondary | ICD-10-CM

## 2014-07-19 DIAGNOSIS — Z130 Encounter for screening for diseases of the blood and blood-forming organs and certain disorders involving the immune mechanism: Secondary | ICD-10-CM

## 2014-07-19 DIAGNOSIS — Z13228 Encounter for screening for other metabolic disorders: Secondary | ICD-10-CM

## 2014-07-19 DIAGNOSIS — Z01419 Encounter for gynecological examination (general) (routine) without abnormal findings: Secondary | ICD-10-CM

## 2014-07-19 DIAGNOSIS — Z30011 Encounter for initial prescription of contraceptive pills: Secondary | ICD-10-CM

## 2014-07-19 DIAGNOSIS — Z13220 Encounter for screening for lipoid disorders: Secondary | ICD-10-CM

## 2014-07-19 DIAGNOSIS — Z1151 Encounter for screening for human papillomavirus (HPV): Secondary | ICD-10-CM | POA: Diagnosis present

## 2014-07-19 DIAGNOSIS — Z124 Encounter for screening for malignant neoplasm of cervix: Secondary | ICD-10-CM

## 2014-07-19 LAB — CBC WITH DIFFERENTIAL/PLATELET
Basophils Absolute: 0.1 10*3/uL (ref 0.0–0.1)
Basophils Relative: 1 % (ref 0–1)
Eosinophils Absolute: 0.2 10*3/uL (ref 0.0–0.7)
Eosinophils Relative: 3 % (ref 0–5)
HCT: 33 % — ABNORMAL LOW (ref 36.0–46.0)
HEMOGLOBIN: 10.6 g/dL — AB (ref 12.0–15.0)
LYMPHS ABS: 2.6 10*3/uL (ref 0.7–4.0)
LYMPHS PCT: 31 % (ref 12–46)
MCH: 25 pg — ABNORMAL LOW (ref 26.0–34.0)
MCHC: 32.1 g/dL (ref 30.0–36.0)
MCV: 77.8 fL — ABNORMAL LOW (ref 78.0–100.0)
Monocytes Absolute: 0.4 10*3/uL (ref 0.1–1.0)
Monocytes Relative: 5 % (ref 3–12)
NEUTROS ABS: 5 10*3/uL (ref 1.7–7.7)
NEUTROS PCT: 60 % (ref 43–77)
PLATELETS: 390 10*3/uL (ref 150–400)
RBC: 4.24 MIL/uL (ref 3.87–5.11)
RDW: 15.4 % (ref 11.5–15.5)
WBC: 8.3 10*3/uL (ref 4.0–10.5)

## 2014-07-19 MED ORDER — NORGESTREL-ETHINYL ESTRADIOL 0.3-30 MG-MCG PO TABS: 1.0000 | ORAL_TABLET | Freq: Every day | ORAL | Status: DC

## 2014-07-19 NOTE — Progress Notes (Signed)
Subjective:    Virginia Bird is a 33 y.o. S AA P1 (33 yo) female who presents for an annual exam. The patient has no complaints today. She is interested in starting OCPs for contraception. She currently uses withdrawal. The patient is sexually active. GYN screening history: was ASCUS with negative colpo in 2013. The patient wears seatbelts: yes. The patient participates in regular exercise: no. Has the patient ever been transfused or tattooed?: no. The patient reports that there is not domestic violence in her life.   Menstrual History: OB History    Gravida Para Term Preterm AB TAB SAB Ectopic Multiple Living   2 1 1  1     1       Patient's last menstrual period was 07/09/2014.    The following portions of the patient's history were reviewed and updated as appropriate: allergies, current medications, past family history, past medical history, past social history, past surgical history and problem list.  Review of Systems Pertinent items are noted in HPI. She occasionally watches children with her mother. She declines  Flu vaccine.   Objective:    BP 109/77 mmHg  Pulse 76  Ht 5\' 1"  (1.549 m)  Wt 153 lb (69.4 kg)  BMI 28.92 kg/m2  LMP 07/09/2014  General Appearance:    Alert, cooperative, no distress, appears stated age  Head:    Normocephalic, without obvious abnormality, atraumatic  Eyes:    PERRL, conjunctiva/corneas clear, EOM's intact, fundi    benign, both eyes  Ears:    Normal TM's and external ear canals, both ears  Nose:   Nares normal, septum midline, mucosa normal, no drainage    or sinus tenderness  Throat:   Lips, mucosa, and tongue normal; teeth and gums normal  Neck:   Supple, symmetrical, trachea midline, no adenopathy;    thyroid:  no enlargement/tenderness/nodules; no carotid   bruit or JVD  Back:     Symmetric, no curvature, ROM normal, no CVA tenderness  Lungs:     Clear to auscultation bilaterally, respirations unlabored  Chest Wall:    No tenderness or  deformity   Heart:    Regular rate and rhythm, S1 and S2 normal, no murmur, rub   or gallop  Breast Exam:    No tenderness, masses, or nipple abnormality  Abdomen:     Soft, non-tender, bowel sounds active all four quadrants,    no masses, no organomegaly  Genitalia:    Normal female without lesion, discharge or tenderness, ULN size, limited mobility, NT, normal adnexal exam     Extremities:   Extremities normal, atraumatic, no cyanosis or edema  Pulses:   2+ and symmetric all extremities  Skin:   Skin color, texture, turgor normal, no rashes or lesions  Lymph nodes:   Cervical, supraclavicular, and axillary nodes normal  Neurologic:   CNII-XII intact, normal strength, sensation and reflexes    throughout  .    Assessment:    Healthy female exam.   Contraception   Plan:     Breast self exam technique reviewed and patient encouraged to perform self-exam monthly. Chlamydia specimen. GC specimen. Thin prep Pap smear. STI testing and fasting labs per her request   Start Lo ovral generic and RTC 1 month for BP check. Backup for a month.

## 2014-07-20 LAB — COMPREHENSIVE METABOLIC PANEL
ALT: 9 U/L (ref 0–35)
AST: 17 U/L (ref 0–37)
Albumin: 4 g/dL (ref 3.5–5.2)
Alkaline Phosphatase: 43 U/L (ref 39–117)
BILIRUBIN TOTAL: 0.2 mg/dL (ref 0.2–1.2)
BUN: 12 mg/dL (ref 6–23)
CO2: 25 meq/L (ref 19–32)
CREATININE: 0.81 mg/dL (ref 0.50–1.10)
Calcium: 9 mg/dL (ref 8.4–10.5)
Chloride: 105 mEq/L (ref 96–112)
GLUCOSE: 89 mg/dL (ref 70–99)
Potassium: 3.9 mEq/L (ref 3.5–5.3)
SODIUM: 140 meq/L (ref 135–145)
TOTAL PROTEIN: 7 g/dL (ref 6.0–8.3)

## 2014-07-20 LAB — LIPID PANEL
CHOLESTEROL: 118 mg/dL (ref 0–200)
HDL: 47 mg/dL (ref >39)
LDL Cholesterol: 41 mg/dL (ref 0–99)
TRIGLYCERIDES: 151 mg/dL — AB (ref <150)
Total CHOL/HDL Ratio: 2.5 Ratio
VLDL: 30 mg/dL (ref 0–40)

## 2014-07-20 LAB — CYTOLOGY - PAP

## 2014-07-20 LAB — RPR

## 2014-07-20 LAB — HEPATITIS C ANTIBODY: HCV Ab: NEGATIVE

## 2014-07-20 LAB — HEPATITIS B SURFACE ANTIGEN: HEP B S AG: NEGATIVE

## 2014-07-20 LAB — HIV ANTIBODY (ROUTINE TESTING W REFLEX): HIV: NONREACTIVE

## 2014-07-20 LAB — TSH: TSH: 1.779 u[IU]/mL (ref 0.350–4.500)

## 2014-08-15 ENCOUNTER — Emergency Department: Payer: Self-pay | Admitting: Emergency Medicine

## 2014-08-15 ENCOUNTER — Telehealth: Payer: Self-pay | Admitting: *Deleted

## 2014-08-15 LAB — URINALYSIS, COMPLETE
BACTERIA: NONE SEEN
Bilirubin,UR: NEGATIVE
GLUCOSE, UR: NEGATIVE mg/dL (ref 0–75)
Ketone: NEGATIVE
Nitrite: NEGATIVE
PH: 6 (ref 4.5–8.0)
Protein: NEGATIVE
RBC,UR: 5 /HPF (ref 0–5)
SPECIFIC GRAVITY: 1.03 (ref 1.003–1.030)

## 2014-08-15 NOTE — Telephone Encounter (Signed)
Left message for patient to call the office regarding test results.

## 2014-08-15 NOTE — Telephone Encounter (Signed)
Patient is calling back for pap results.  Colpo is scheduled for December 11 at 8:15 with Dr. Hulan Fray.

## 2014-08-15 NOTE — Telephone Encounter (Signed)
-----   Message from Emily Filbert, MD sent at 08/02/2014 10:15 AM EST ----- She will need another colpo. Thanks

## 2014-08-25 ENCOUNTER — Encounter: Payer: Self-pay | Admitting: Obstetrics & Gynecology

## 2014-08-25 ENCOUNTER — Ambulatory Visit (INDEPENDENT_AMBULATORY_CARE_PROVIDER_SITE_OTHER): Payer: Medicaid Other | Admitting: Obstetrics & Gynecology

## 2014-08-25 VITALS — BP 116/80 | HR 84 | Ht 61.0 in | Wt 155.0 lb

## 2014-08-25 DIAGNOSIS — Z01812 Encounter for preprocedural laboratory examination: Secondary | ICD-10-CM

## 2014-08-25 DIAGNOSIS — R87610 Atypical squamous cells of undetermined significance on cytologic smear of cervix (ASC-US): Secondary | ICD-10-CM

## 2014-08-25 DIAGNOSIS — R87810 Cervical high risk human papillomavirus (HPV) DNA test positive: Secondary | ICD-10-CM

## 2014-08-25 LAB — POCT URINE PREGNANCY: Preg Test, Ur: NEGATIVE

## 2014-08-25 NOTE — Progress Notes (Signed)
   Subjective:    Patient ID: Virginia Bird, female    DOB: Aug 24, 1981, 33 y.o.   MRN: [id number]  HPI  Virginia Bird is here for a colpo due to a pap last month that showed ASCUS +HR HPV. She never picked up the OCPs which is a good thing as she told me today that she is a smoker. She says that she thinks that she doesn't want more kids but she is not entirely sure so she does not want permanent sterility  Review of Systems     Objective:   Physical Exam UPT negative, consent signed, time out done Cervix prepped with acetic acid. Transformation zone seen in its entirety. Colpo adequate. Lots of clear mucous c/w ovulation. 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 This area was biopsied. ECC obtained. She tolerated the procedure well.         Assessment & Plan:  Contraception- information about Mirena and Nexplanon given ASCUS pap- await biopsy and ecc results.

## 2014-08-28 DIAGNOSIS — N870 Mild cervical dysplasia: Secondary | ICD-10-CM | POA: Insufficient documentation

## 2014-09-01 DIAGNOSIS — R87610 Atypical squamous cells of undetermined significance on cytologic smear of cervix (ASC-US): Secondary | ICD-10-CM | POA: Insufficient documentation

## 2014-09-01 DIAGNOSIS — R87810 Cervical high risk human papillomavirus (HPV) DNA test positive: Principal | ICD-10-CM

## 2016-09-04 ENCOUNTER — Emergency Department
Admission: EM | Admit: 2016-09-04 | Discharge: 2016-09-04 | Disposition: A | Discharge status: Home / Self Care | Payer: Medicaid Other | Attending: Emergency Medicine | Admitting: Emergency Medicine

## 2016-09-04 ENCOUNTER — Encounter: Payer: Self-pay | Admitting: Emergency Medicine

## 2016-09-04 DIAGNOSIS — J020 Streptococcal pharyngitis: Secondary | ICD-10-CM | POA: Diagnosis not present

## 2016-09-04 DIAGNOSIS — R51 Headache: Secondary | ICD-10-CM | POA: Diagnosis present

## 2016-09-04 DIAGNOSIS — F17200 Nicotine dependence, unspecified, uncomplicated: Secondary | ICD-10-CM | POA: Insufficient documentation

## 2016-09-04 LAB — POCT RAPID STREP A: STREPTOCOCCUS, GROUP A SCREEN (DIRECT): POSITIVE — AB

## 2016-09-04 MED ORDER — AMOXICILLIN 500 MG PO CAPS: 500.0000 mg | ORAL_CAPSULE | Freq: Three times a day (TID) | ORAL | 0 refills | Status: DC

## 2016-09-04 NOTE — ED Triage Notes (Signed)
Pt c/o HA and sore throat. Pt states HA started intermittent since Monday, denies light/sound sensitivity. Pt states some relief with mucinex, states hx of headaches due to sinus problems. Pt is alert and oriented at this time. Pt states sore throat started last night and has progressively gotten worse.

## 2016-09-04 NOTE — Discharge Instructions (Signed)
Begin taking amoxicillin 3 times a day for the next 10 days. May take Tylenol or ibuprofen as needed for throat pain or fever. You may continue taking over-the-counter decongestants for nasal congestion and also use saline nose spray as needed. Follow-up with [facility] clinic or your primary care doctor if any continued problems.

## 2016-09-04 NOTE — ED Provider Notes (Signed)
[facility] Emergency Department Provider Note   ____________________________________________   First MD Initiated Contact with Patient 09/04/16 1821     (approximate)  I have reviewed the triage vital signs and the nursing notes.   HISTORY  Chief Complaint Headache and Sore Throat   HPI Virginia Bird is a 35 y.o. female is here with complaint of headache and sore throat. Patient states that she began having a headache at the beginning of the week along with sinus congestion. She has been taking over-the-counter Mucinex and other sinus preparations with some minimal relief. Patient states that yesterday she began with a sore throat that progressively got worse during the day. She also complains of left-sided ear pain. Patient continues to be able to drink fluids without any difficulty. She is unaware of any fever and denies chills. She rates her pain as 9/10.   History reviewed. No pertinent past medical history.  Patient Active Problem List   Diagnosis Date Noted  . ASCUS with positive high risk HPV cervical 08/25/2014  . Abnormal Pap smear, atypical squamous cells of undetermined sign (ASC-US) 02/25/2012  . Ovarian cyst 01/27/2012    Past Surgical History:  Procedure Laterality Date  . 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 Authorizing Provider  amoxicillin (AMOXIL) 500 MG capsule Take 1 capsule (500 mg total) by mouth 3 (three) times daily. 09/04/16   Johnn Hai, PA-C    Allergies Patient has no known allergies.  Family History  Problem Relation Age of Onset  . Thyroid disease Mother   . Hypertension Mother   . Cancer Father     leukemia  . Stroke Father   . Cancer Maternal Aunt     Breast Cancer    Social History Social History  Substance Use Topics  . Smoking status: Current Every  Day Smoker    Packs/day: 0.50    Years: 10.00  . Smokeless tobacco: Never Used     Comment: Has not tried any quit aids/tn  . Alcohol use 0.0 oz/week     Comment: weekends    Review of Systems Constitutional: No fever/chills Eyes: No visual changes. ENT: Positive sore throat. Cardiovascular: Denies chest pain. Respiratory: Denies shortness of breath. Gastrointestinal: No abdominal pain.  No nausea, no vomiting.   Musculoskeletal: Positive general body aches. Skin: Negative for rash. Neurological: Positive for headaches, no focal weakness or numbness.  10-point ROS otherwise negative.  ____________________________________________   PHYSICAL EXAM:  VITAL SIGNS: ED Triage Vitals [09/04/16 1741]  Enc Vitals Group     BP 117/78     Pulse Rate 91     Resp 18     Temp 98.4 F (36.9 C)     Temp Source Oral     SpO2 100 %     Weight 150 lb (68 kg)     Height 5\' 1"  (1.549 m)     Head Circumference      Peak Flow      Pain Score 9     Pain Loc      Pain Edu?      Excl. in [place]?     Constitutional: Alert and oriented. Well appearing and in no acute distress. Eyes: Conjunctivae are normal. PERRL. EOMI. Head: Atraumatic. Nose: No congestion/rhinnorhea.  EACs and TMs are clear bilaterally. Mouth/Throat: Mucous  membranes are moist.  Oropharynx erythematous without exudate present. Neck: No stridor.   Hematological/Lymphatic/Immunilogical: No cervical lymphadenopathy. Cardiovascular: Normal rate, regular rhythm. Grossly normal heart sounds.  Good peripheral circulation. Respiratory: Normal respiratory effort.  No retractions. Lungs CTAB. Neurologic:  Normal speech and language. No gross focal neurologic deficits are appreciated. No gait instability. Skin:  Skin is warm, dry and intact. No rash noted. Psychiatric: Mood and affect are normal. Speech and behavior are normal.  ____________________________________________   LABS (all labs ordered are listed, but only abnormal  results are displayed)  Labs Reviewed  POCT RAPID STREP A - Abnormal; Notable for the following:       Result Value   Streptococcus, Group A Screen (Direct) POSITIVE (*)    All other components within normal limits    PROCEDURES  Procedure(s) performed: None  Procedures  Critical Care performed: No  ____________________________________________   INITIAL IMPRESSION / ASSESSMENT AND PLAN / ED COURSE  Pertinent labs & imaging results that were available during my care of the patient were reviewed by me and considered in my medical decision making (see chart for details).    Clinical Course    Patient was made aware that her strep test was positive and this most likely his cause of her ear pain as well. Patient will continue taking over-the-counter medications for her nasal congestion and use saline nose spray as needed. She was started on amoxicillin 500 mg 3 times a day for 10 days. She is encouraged to take Tylenol or ibuprofen as needed for throat pain.  ____________________________________________   FINAL CLINICAL IMPRESSION(S) / ED DIAGNOSES  Final diagnoses:  Strep pharyngitis      NEW MEDICATIONS STARTED DURING THIS VISIT:  Discharge Medication List as of 09/04/2016  6:56 PM    START taking these medications   Details  amoxicillin (AMOXIL) 500 MG capsule Take 1 capsule (500 mg total) by mouth 3 (three) times daily., Starting Thu 09/04/2016, Print         Note:  This document was prepared using Dragon voice recognition software and may include unintentional dictation errors.    Johnn Hai, PA-C 09/04/16 1927    Hinda Kehr, MD 09/04/16 2009

## 2016-09-06 ENCOUNTER — Encounter (HOSPITAL_COMMUNITY): Payer: Self-pay

## 2016-09-06 ENCOUNTER — Emergency Department (HOSPITAL_COMMUNITY)
Admission: EM | Admit: 2016-09-06 | Discharge: 2016-09-06 | Disposition: A | Discharge status: Home / Self Care | Payer: Medicaid Other | Attending: Emergency Medicine | Admitting: Emergency Medicine

## 2016-09-06 DIAGNOSIS — F17200 Nicotine dependence, unspecified, uncomplicated: Secondary | ICD-10-CM | POA: Diagnosis not present

## 2016-09-06 DIAGNOSIS — J020 Streptococcal pharyngitis: Secondary | ICD-10-CM | POA: Diagnosis not present

## 2016-09-06 DIAGNOSIS — J029 Acute pharyngitis, unspecified: Secondary | ICD-10-CM | POA: Diagnosis present

## 2016-09-06 MED ORDER — KETOROLAC TROMETHAMINE 30 MG/ML IJ SOLN
30.0000 mg | Freq: Once | INTRAMUSCULAR | Status: AC
  Administered 2016-09-06: 30 mg via INTRAVENOUS
  Filled 2016-09-06: qty 1

## 2016-09-06 MED ORDER — DEXAMETHASONE SODIUM PHOSPHATE 10 MG/ML IJ SOLN
10.0000 mg | Freq: Once | INTRAMUSCULAR | Status: AC
  Administered 2016-09-06: 10 mg via INTRAVENOUS
  Filled 2016-09-06: qty 1

## 2016-09-06 MED ORDER — SODIUM CHLORIDE 0.9 % IV BOLUS (SEPSIS)
1000.0000 mL | Freq: Once | INTRAVENOUS | Status: AC
Start: 2016-09-06 — End: 2016-09-06
  Administered 2016-09-06: 1000 mL via INTRAVENOUS

## 2016-09-06 MED ORDER — AMOXICILLIN 500 MG PO CAPS
500.0000 mg | ORAL_CAPSULE | Freq: Once | ORAL | Status: AC
  Administered 2016-09-06: 500 mg via ORAL
  Filled 2016-09-06: qty 1

## 2016-09-06 NOTE — ED Triage Notes (Signed)
Pt seen at [facility] [id number] for sore throat, headache (worse with bending over and standing up).  Pt started on antibiotic 09-04-16.  Pt reports no improvement in sore throat.  Still having headache when bending over and standing up.  Decreased PO fluid/food intake.  No difficulty swallowing.

## 2016-09-06 NOTE — Discharge Instructions (Signed)
1. Medications: continue Amoxicillin, usual home medications 2. Treatment: rest, drink plenty of fluids, soft diet, ibuprofen 800mg  every 8 hours for pain or fever 3. Follow Up: Please followup with your primary doctor in 2-3 days for discussion of your diagnoses and further evaluation after today's visit; if you do not have a primary care doctor use the resource guide provided to find one; Please return to the ER for difficulty breathing, difficulty swallowing, worsening symptoms or other concerns

## 2016-09-06 NOTE — ED Notes (Signed)
Pt provided with water and apple juice

## 2016-09-06 NOTE — ED Provider Notes (Signed)
[facility] DEPT Provider Note   CSN: [id number] Arrival date & time: 09/06/16  1856     History   Chief Complaint Chief Complaint  Patient presents with  . Sore Throat    HPI Virginia Bird is a 35 y.o. female with a hx of Abnormal Pap smear presents to the Emergency Department complaining of gradual, persistent, progressively worsening sore throat onset Approximately 1 week ago. Patient was seen at [facility] on 09/04/2016 and diagnosed with strep pharyngitis. She was started on amoxicillin at that time. He reports that she continues to have a sore throat and is concerned that she is not getting better.  She reports significant difficulty with tolerating by mouth due to the pain with swallowing. She reports she has been able to swallow her amoxicillin but has not been taking any ibuprofen due to pain with swallowing.  Patient denies changes in voice, drooling, inability to swallow, fever, chills.   The history is provided by the patient and medical records. No language interpreter was used.    History reviewed. No pertinent past medical history.  Patient Active Problem List   Diagnosis Date Noted  . ASCUS with positive high risk HPV cervical 08/25/2014  . Abnormal Pap smear, atypical squamous cells of undetermined sign (ASC-US) 02/25/2012  . Ovarian cyst 01/27/2012    Past Surgical History:  Procedure Laterality Date  . 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 Authorizing Provider  amoxicillin (AMOXIL) 500 MG capsule Take 1 capsule (500 mg total) by mouth 3 (three) times daily. 09/04/16  Yes Johnn Hai, PA-C    Family History Family History  Problem Relation  Age of Onset  . Thyroid disease Mother   . Hypertension Mother   . Cancer Father     leukemia  . Stroke Father   . Cancer Maternal Aunt     Breast Cancer    Social History Social History  Substance Use Topics  . Smoking status: Current Every Day Smoker    Packs/day: 0.50    Years: 10.00  . Smokeless tobacco: Never Used     Comment: Has not tried any quit aids/tn  . Alcohol use 0.0 oz/week     Comment: weekends     Allergies   Patient has no known allergies.   Review of Systems Review of Systems  Constitutional: Positive for fatigue.  HENT: Positive for congestion, postnasal drip, rhinorrhea and sore throat.   All other systems reviewed and are negative.    Physical Exam Updated Vital Signs BP 121/79 (BP Location: Right Arm)   Pulse 90   Temp 98.2 F (36.8 C) (Oral)   Resp 16   LMP 08/25/2016 (Within Days)   SpO2 100%   Physical Exam  Constitutional: She appears well-developed and well-nourished. No distress.  HENT:  Head: Normocephalic and atraumatic.  Right Ear: Tympanic membrane, external ear and ear canal normal.  Left Ear: Tympanic membrane, external ear and ear canal normal.  Nose: Nose normal. No mucosal edema or rhinorrhea.  Mouth/Throat: Uvula is midline and  mucous membranes are normal. Mucous membranes are not dry. No trismus in the jaw. No uvula swelling. Oropharyngeal exudate, posterior oropharyngeal edema and posterior oropharyngeal erythema present. No tonsillar abscesses.  Posterior oropharynx with erythema, edema and exudate on the tonsils Uvula midline and no asymmetric swelling of the pharynx  Eyes: Conjunctivae are normal.  Neck: Normal range of motion, full passive range of motion without pain and phonation normal. No tracheal tenderness, no spinous process tenderness and no muscular tenderness present. No neck rigidity. No erythema and normal range of motion present. No Brudzinski's sign and no Kernig's sign noted.  Range of motion without  pain  No midline or paraspinal tenderness Normal phonation No stridor Handling secretions without difficulty No nuchal rigidity or meningeal signs  Cardiovascular: Normal rate, regular rhythm and normal heart sounds.   Pulses:      Radial pulses are 2+ on the right side, and 2+ on the left side.  Pulmonary/Chest: Effort normal and breath sounds normal. No stridor. No respiratory distress. She has no decreased breath sounds. She has no wheezes.  Equal chest expansion, clear and equal breath sounds without focal wheezes, rhonchi or rales  Musculoskeletal: Normal range of motion.  Lymphadenopathy:       Head (right side): Submandibular and tonsillar adenopathy present. No submental, no preauricular, no posterior auricular and no occipital adenopathy present.       Head (left side): Submandibular and tonsillar adenopathy present. No submental, no preauricular, no posterior auricular and no occipital adenopathy present.    She has cervical adenopathy.       Right cervical: Superficial cervical adenopathy present. No deep cervical and no posterior cervical adenopathy present.      Left cervical: Superficial cervical adenopathy present. No deep cervical and no posterior cervical adenopathy present.  Neurological: She is alert.  Alert and oriented Moves all extremities without ataxia  Skin: Skin is warm and dry. She is not diaphoretic.  Psychiatric: She has a normal mood and affect.  Nursing note and vitals reviewed.    ED Treatments / Results   Procedures Procedures (including critical care time)  Medications Ordered in ED Medications  sodium chloride 0.9 % bolus 1,000 mL (0 mLs Intravenous Stopped 09/06/16 2253)  ketorolac (TORADOL) 30 MG/ML injection 30 mg (30 mg Intravenous Given 09/06/16 2151)  dexamethasone (DECADRON) injection 10 mg (10 mg Intravenous Given 09/06/16 2151)  amoxicillin (AMOXIL) capsule 500 mg (500 mg Oral Given 09/06/16 2147)     Initial Impression / Assessment  and Plan / ED Course  I have reviewed the triage vital signs and the nursing notes.  Pertinent labs & imaging results that were available during my care of the patient were reviewed by me and considered in my medical decision making (see chart for details).  Clinical Course as of Sep 06 2336  Sat Sep 06, 2016  2317 Patient reports she is feeling much better. Tachycardia has resolved. She is tolerating by mouth without difficulty.  [HM]    Clinical Course User Index [HM] Jarrett Soho Klever [place], PA-C   Pt with persistent sore throat 2 days after diagnosis of strep pharyngitis via rapid strep test at [facility].  Patient has been taking amoxicillin but continues to have pain. She expresses concern about her oral intake due to pain. Exam is reassuring. No evidence of peritonsillar abscess. Patient with normal phonation and handling secretions. Given fluids, Decadron and Toradol with significant improvement. Patient able to eat and drink here in the emergency department. Discussed importance of  continued antibiotic usage and ibuprofen for pain control. Also discussed reasons to return to the emergency department including inability to swallow, drooling, development of fevers or other concerns.  Final Clinical Impressions(s) / ED Diagnoses   Final diagnoses:  Strep throat    New Prescriptions Discharge Medication List as of 09/06/2016 11:28 PM       Jarrett Soho Saanya Zieske, PA-C 09/06/16 [id number]    Sherwood Gambler, MD 09/08/16 [phone]

## 2017-03-08 ENCOUNTER — Emergency Department (HOSPITAL_COMMUNITY)
Admission: EM | Admit: 2017-03-08 | Discharge: 2017-03-08 | Disposition: A | Discharge status: Home / Self Care | Payer: Medicaid Other | Attending: Emergency Medicine | Admitting: Emergency Medicine

## 2017-03-08 ENCOUNTER — Encounter (HOSPITAL_COMMUNITY): Payer: Self-pay

## 2017-03-08 DIAGNOSIS — K047 Periapical abscess without sinus: Secondary | ICD-10-CM | POA: Insufficient documentation

## 2017-03-08 DIAGNOSIS — R600 Localized edema: Secondary | ICD-10-CM | POA: Diagnosis present

## 2017-03-08 DIAGNOSIS — F17200 Nicotine dependence, unspecified, uncomplicated: Secondary | ICD-10-CM | POA: Diagnosis not present

## 2017-03-08 MED ORDER — AMOXICILLIN-POT CLAVULANATE 875-125 MG PO TABS: 1.0000 | ORAL_TABLET | Freq: Two times a day (BID) | ORAL | 0 refills | Status: DC

## 2017-03-08 MED ORDER — AMOXICILLIN-POT CLAVULANATE 875-125 MG PO TABS
1.0000 | ORAL_TABLET | Freq: Once | ORAL | Status: AC
  Administered 2017-03-08: 1 via ORAL
  Filled 2017-03-08: qty 1

## 2017-03-08 MED ORDER — IBUPROFEN 400 MG PO TABS: 400.0000 mg | ORAL_TABLET | Freq: Three times a day (TID) | ORAL | 0 refills | Status: AC

## 2017-03-08 NOTE — Discharge Instructions (Signed)
As discussed, your evaluation today has been largely reassuring.  But, it is important that you monitor your condition carefully, and do not hesitate to return to the ED if you develop new, or concerning changes in your condition. ? ?Otherwise, please follow-up with your physician for appropriate ongoing care. ? ?

## 2017-03-08 NOTE — ED Provider Notes (Signed)
[place] DEPT Provider Note   CSN: [id number] Arrival date & time: 03/08/17  1436  By signing my name below, I, Mayer Masker, attest that this documentation has been prepared under the direction and in the presence of Carmin Muskrat, MD. Electronically Signed: Mayer Masker, Scribe. 03/08/17. 4:35 PM.  History   Chief Complaint No chief complaint on file.  The history is provided by the patient. No language interpreter was used.    HPI Comments: Virginia Bird is a 36 y.o. female who presents to the Emergency Department complaining of constant, mild right-sided upper dental pain that began last night. She has associated swelling to her face and mild numbness to the area. She denies fever, vomiting, nausea, and visual disturbances. Pt has no dental problems to her knowledge and is otherwise healthy with no pertinent medical history or medical problems. Pt has a Pharmacist, community.  History reviewed. No pertinent past medical history.  Patient Active Problem List   Diagnosis Date Noted  . ASCUS with positive high risk HPV cervical 08/25/2014  . Abnormal Pap smear, atypical squamous cells of undetermined sign (ASC-US) 02/25/2012  . Ovarian cyst 01/27/2012    Past Surgical History:  Procedure Laterality Date  . 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 Authorizing Provider  amoxicillin (AMOXIL) 500 MG capsule Take 1 capsule (500 mg total) by mouth 3 (three) times daily. 09/04/16   Johnn Hai, PA-C    Family History Family History  Problem Relation Age of Onset  . Thyroid disease Mother   . Hypertension Mother   . Cancer Father        leukemia  . Stroke Father   . Cancer Maternal Aunt       Breast Cancer    Social History Social History  Substance Use Topics  . Smoking status: Current Every Day Smoker    Packs/day: 0.50    Years: 10.00  . Smokeless tobacco: Never Used     Comment: Has not tried any quit aids/tn  . Alcohol use 0.0 oz/week     Comment: weekends     Allergies   Patient has no known allergies.   Review of Systems Review of Systems  Constitutional: Negative for fever.  HENT: Positive for dental problem and facial swelling.   Eyes: Negative for visual disturbance.  Respiratory: Negative for shortness of breath.   Cardiovascular: Negative for chest pain.  Gastrointestinal: Negative for nausea and vomiting.  Musculoskeletal:       Negative aside from HPI  Skin:       Negative aside from HPI  Allergic/Immunologic: Negative for immunocompromised state.  Neurological: Positive for numbness. Negative for weakness.     Physical Exam Updated Vital Signs BP 119/85 (BP Location: Right Arm)   Pulse (!) 104   Temp 98.2 F (36.8 C) (Oral)   Resp 16   Ht 5\' 2"  (1.575 m)   Wt 150 lb (68 kg)   SpO2 100%   BMI 27.44 kg/m   Physical Exam  Constitutional: She is oriented to person, place, and  time. She appears well-developed and well-nourished. No distress.  HENT:  Head: Normocephalic and atraumatic.  Right inferior mandibular adenopathy Right inferior posterior 2 molars are both removed, gumline is okay  Eyes: Conjunctivae and EOM are normal.  Cardiovascular: Normal rate and regular rhythm.   Pulmonary/Chest: Effort normal and breath sounds normal. No stridor. No respiratory distress.  Abdominal: She exhibits no distension.  Musculoskeletal: She exhibits no edema.  Neurological: She is alert and oriented to person, place, and time. No cranial nerve deficit.  Skin: Skin is warm and dry.  Psychiatric: She has a normal mood and affect.  Nursing note and vitals reviewed.    ED Treatments / Results  DIAGNOSTIC STUDIES: Oxygen Saturation is  100% on RA, normal by my interpretation.    COORDINATION OF CARE: 4:35 PM Discussed treatment plan with pt at bedside and pt agreed to plan.   Procedures Procedures (including critical care time)  Medications Ordered in ED Medications  amoxicillin-clavulanate (AUGMENTIN) 875-125 MG per tablet 1 tablet (not administered)     Initial Impression / Assessment and Plan / ED Course  I have reviewed the triage vital signs and the nursing notes.  Pertinent labs & imaging results that were available during my care of the patient were reviewed by me and considered in my medical decision making (see chart for details).  Well-appearing young female presents with one day of right facial swelling, minor discomfort. She is awake, alert, with no evidence for oropharyngeal compromise, no bacteremia, sepsis. Patient started on antibiotics, discharged with close outpatient dental follow-up.   Final Clinical Impressions(s) / ED Diagnoses   Final diagnoses:  Dental infection    New Prescriptions New Prescriptions   AMOXICILLIN-CLAVULANATE (AUGMENTIN) 875-125 MG TABLET    Take 1 tablet by mouth every 12 (twelve) hours.   IBUPROFEN (ADVIL,MOTRIN) 400 MG TABLET    Take 1 tablet (400 mg total) by mouth 3 (three) times daily. Take one tablet three times daily for three days   I personally performed the services described in this documentation, which was scribed in my presence. The recorded information has been reviewed and is accurate.     Carmin Muskrat, MD 03/08/17 [phone]

## 2017-03-08 NOTE — ED Triage Notes (Signed)
Patient complains of right sided facial pain that she describes as tender x 1 day, thinks related to dental abscess. NAD

## 2017-03-25 DIAGNOSIS — R21 Rash and other nonspecific skin eruption: Secondary | ICD-10-CM | POA: Insufficient documentation

## 2017-03-25 DIAGNOSIS — F17210 Nicotine dependence, cigarettes, uncomplicated: Secondary | ICD-10-CM | POA: Insufficient documentation

## 2017-03-25 DIAGNOSIS — R202 Paresthesia of skin: Secondary | ICD-10-CM | POA: Diagnosis present

## 2017-03-25 DIAGNOSIS — T7840XA Allergy, unspecified, initial encounter: Secondary | ICD-10-CM | POA: Diagnosis not present

## 2017-03-26 ENCOUNTER — Encounter (HOSPITAL_COMMUNITY): Payer: Self-pay | Admitting: Emergency Medicine

## 2017-03-26 ENCOUNTER — Emergency Department (HOSPITAL_COMMUNITY)
Admission: EM | Admit: 2017-03-26 | Discharge: 2017-03-26 | Disposition: A | Discharge status: Home / Self Care | Payer: Medicaid Other | Attending: Emergency Medicine | Admitting: Emergency Medicine

## 2017-03-26 DIAGNOSIS — T7840XA Allergy, unspecified, initial encounter: Secondary | ICD-10-CM

## 2017-03-26 MED ORDER — DIPHENHYDRAMINE HCL 25 MG PO CAPS
25.0000 mg | ORAL_CAPSULE | Freq: Once | ORAL | Status: AC
  Administered 2017-03-26: 25 mg via ORAL
  Filled 2017-03-26: qty 1

## 2017-03-26 MED ORDER — DIPHENHYDRAMINE HCL 25 MG PO CAPS: 25.0000 mg | ORAL_CAPSULE | Freq: Four times a day (QID) | ORAL | 0 refills | Status: DC | PRN

## 2017-03-26 MED ORDER — FAMOTIDINE 20 MG PO TABS
20.0000 mg | ORAL_TABLET | Freq: Once | ORAL | Status: AC
  Administered 2017-03-26: 20 mg via ORAL
  Filled 2017-03-26: qty 1

## 2017-03-26 MED ORDER — PREDNISONE 20 MG PO TABS: 40.0000 mg | ORAL_TABLET | Freq: Every day | ORAL | 0 refills | Status: DC

## 2017-03-26 MED ORDER — FAMOTIDINE 20 MG PO TABS: 20.0000 mg | ORAL_TABLET | Freq: Every day | ORAL | 0 refills | Status: DC

## 2017-03-26 MED ORDER — PREDNISONE 20 MG PO TABS
60.0000 mg | ORAL_TABLET | Freq: Once | ORAL | Status: AC
  Administered 2017-03-26: 60 mg via ORAL
  Filled 2017-03-26: qty 3

## 2017-03-26 MED ORDER — EPINEPHRINE 0.3 MG/0.3ML IJ SOAJ: 0.3000 mg | Freq: Once | INTRAMUSCULAR | 0 refills | Status: DC | PRN

## 2017-03-26 NOTE — ED Provider Notes (Signed)
[place] DEPT Provider Note   CSN: [id number] Arrival date & time: 03/25/17  2359   By signing my name below, I, Eunice Blase, attest that this documentation has been prepared under the direction and in the presence of Horton, Barbette Hair, MD. Electronically signed, Eunice Blase, ED Scribe. 03/26/17. 1:12 AM.  History   Chief Complaint Chief Complaint  Patient presents with  . Allergic Reaction   The history is provided by the patient and medical records. No language interpreter was used.    Virginia Bird is a 36 y.o. female presenting to the Emergency Department concerning an episode of throat swelling onset last night 20 minutes after eating chinese food. She states her face broke out and she had palpitations, prompting her to report to [facility] ED. Symptoms resolved currently, but pt also notes associated "throat closing" sensation, itchy eyes and face during her episode of symptoms. Triage notes no difficulty breathing or speaking. No PTA medications. No known chronic health disorders noted. Tobacco use noted; no ETOH use. No SOB or vomiting.  History reviewed. No pertinent past medical history.  Patient Active Problem List   Diagnosis Date Noted  . ASCUS with positive high risk HPV cervical 08/25/2014  . Abnormal Pap smear, atypical squamous cells of undetermined sign (ASC-US) 02/25/2012  . Ovarian cyst 01/27/2012    Past Surgical History:  Procedure Laterality Date  . 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 Authorizing Provider  amoxicillin (AMOXIL) 500 MG capsule Take 1 capsule (500 mg total) by mouth 3 (three) times daily. 09/04/16   Johnn Hai, PA-C  amoxicillin-clavulanate  (AUGMENTIN) 875-125 MG tablet Take 1 tablet by mouth every 12 (twelve) hours. 03/08/17   Carmin Muskrat, MD  diphenhydrAMINE (BENADRYL) 25 mg capsule Take 1 capsule (25 mg total) by mouth every 6 (six) hours as needed. 03/26/17   Horton, Barbette Hair, MD  EPINEPHrine 0.3 mg/0.3 mL IJ SOAJ injection Inject 0.3 mLs (0.3 mg total) into the muscle once as needed (anaphylaxis). 03/26/17   Horton, Barbette Hair, MD  famotidine (PEPCID) 20 MG tablet Take 1 tablet (20 mg total) by mouth daily. 03/26/17   Horton, Barbette Hair, MD  predniSONE (DELTASONE) 20 MG tablet Take 2 tablets (40 mg total) by mouth daily. 03/26/17   Horton, Barbette Hair, MD    Family History Family History  Problem Relation Age of Onset  . Thyroid disease Mother   . Hypertension Mother   . Cancer Father        leukemia  . Stroke Father   . Cancer Maternal Aunt        Breast Cancer    Social History Social History  Substance Use Topics  . Smoking status: Current Every Day Smoker    Packs/day: 0.50    Years: 10.00  . Smokeless tobacco: Never Used     Comment: Has not tried any quit aids/tn  . Alcohol use 0.0 oz/week     Comment: weekends     Allergies   Patient has no known allergies.   Review of  Systems Review of Systems  Constitutional:       Itching  HENT: Negative for facial swelling, trouble swallowing (resolved) and voice change.   Eyes: Negative for itching (resolved).  Respiratory: Negative for shortness of breath.   Gastrointestinal: Negative for nausea and vomiting.  Skin: Positive for rash.  Neurological: Negative for speech difficulty.  All other systems reviewed and are negative.    Physical Exam Updated Vital Signs BP 118/75 (BP Location: Right Arm)   Pulse 94   Temp 98.5 F (36.9 C) (Oral)   Resp 20   LMP 03/05/2017 (Exact Date)   SpO2 99%   Physical Exam  Constitutional: She is oriented to person, place, and time. She appears well-developed and well-nourished.  HENT:  Head: Normocephalic  and atraumatic.  Mouth/Throat: Oropharynx is clear and moist.  No oropharyngeal swelling noted  Cardiovascular: Normal rate, regular rhythm and normal heart sounds.   No murmur heard. Pulmonary/Chest: Effort normal and breath sounds normal. No respiratory distress. She has no wheezes.  Abdominal: Soft. There is no tenderness.  Neurological: She is alert and oriented to person, place, and time.  Skin: Skin is warm and dry. No rash noted.  Psychiatric: She has a normal mood and affect.  Nursing note and vitals reviewed.    ED Treatments / Results  DIAGNOSTIC STUDIES: Oxygen Saturation is 99% on RA, NL by my interpretation.    COORDINATION OF CARE: 1:04 AM-Discussed next steps with pt. Pt verbalized understanding and is agreeable with the plan. Will order benadryl and prednisone, then prepare for d/c.   Labs (all labs ordered are listed, but only abnormal results are displayed) Labs Reviewed - No data to display  EKG  EKG Interpretation None       Radiology No results found.  Procedures Procedures (including critical care time)  Medications Ordered in ED Medications  famotidine (PEPCID) tablet 20 mg (not administered)  diphenhydrAMINE (BENADRYL) capsule 25 mg (not administered)  predniSONE (DELTASONE) tablet 60 mg (not administered)     Initial Impression / Assessment and Plan / ED Course  I have reviewed the triage vital signs and the nursing notes.  Pertinent labs & imaging results that were available during my care of the patient were reviewed by me and considered in my medical decision making (see chart for details).     Patient presents with possible allergic reaction. Self resolved. Does report several food allergies. She is nontoxic. No signs or symptoms of anaphylaxis at this time. Patient was given Pepcid, Benadryl, and redness own. Will discharge with the same and EpiPen. Patient was instructed on EpiPen use if she were to have recurrence of symptoms or  symptoms of anaphylaxis.  After history, exam, and medical workup I feel the patient has been appropriately medically screened and is safe for discharge home. Pertinent diagnoses were discussed with the patient. Patient was given return precautions.   Final Clinical Impressions(s) / ED Diagnoses   Final diagnoses:  Allergic reaction, initial encounter    New Prescriptions New Prescriptions   DIPHENHYDRAMINE (BENADRYL) 25 MG CAPSULE    Take 1 capsule (25 mg total) by mouth every 6 (six) hours as needed.   EPINEPHRINE 0.3 MG/0.3 ML IJ SOAJ INJECTION    Inject 0.3 mLs (0.3 mg total) into the muscle once as needed (anaphylaxis).   FAMOTIDINE (PEPCID) 20 MG TABLET    Take 1 tablet (20 mg total) by mouth daily.   PREDNISONE (DELTASONE) 20 MG TABLET    Take 2 tablets (40  mg total) by mouth daily.   I personally performed the services described in this documentation, which was scribed in my presence. The recorded information has been reviewed and is accurate.    Merryl Hacker, MD 03/26/17 [phone]

## 2017-03-26 NOTE — ED Notes (Signed)
ED Provider at bedside. 

## 2017-03-26 NOTE — Discharge Instructions (Signed)
If you have recurrence of reaction involving shortness of breath, vomiting, started swelling, you should use your EpiPen.

## 2017-03-26 NOTE — ED Triage Notes (Signed)
Pt reports that after she ate chinese food this evening she began to have "alergic reaction symptoms" including "itchy eyes, throat swelling, itchy face".  She reports that she no longer has symptoms but needs to be checked.  Pt is able to breath/speak normally, does not appear to be in distress.

## 2017-08-28 ENCOUNTER — Ambulatory Visit (INDEPENDENT_AMBULATORY_CARE_PROVIDER_SITE_OTHER): Payer: Medicaid Other | Admitting: Obstetrics & Gynecology

## 2017-08-28 ENCOUNTER — Other Ambulatory Visit (HOSPITAL_COMMUNITY)
Admission: RE | Admit: 2017-08-28 | Discharge: 2017-08-28 | Disposition: A | Discharge status: Home / Self Care | Payer: Medicaid Other | Source: Ambulatory Visit | Attending: Obstetrics & Gynecology | Admitting: Obstetrics & Gynecology

## 2017-08-28 ENCOUNTER — Encounter: Payer: Self-pay | Admitting: Obstetrics & Gynecology

## 2017-08-28 VITALS — BP 128/80 | HR 72 | Wt 156.0 lb

## 2017-08-28 DIAGNOSIS — Z01419 Encounter for gynecological examination (general) (routine) without abnormal findings: Secondary | ICD-10-CM | POA: Insufficient documentation

## 2017-08-28 DIAGNOSIS — R87610 Atypical squamous cells of undetermined significance on cytologic smear of cervix (ASC-US): Secondary | ICD-10-CM | POA: Insufficient documentation

## 2017-08-28 DIAGNOSIS — Z309 Encounter for contraceptive management, unspecified: Secondary | ICD-10-CM | POA: Diagnosis not present

## 2017-08-28 NOTE — Progress Notes (Signed)
GYNECOLOGY ANNUAL PREVENTATIVE CARE ENCOUNTER NOTE  Subjective:   Virginia Bird is a 36 y.o. G35P1011 female here for a routine annual gynecologic exam.  Current complaints: none.  Desires STI screen. Denies abnormal vaginal bleeding, discharge, pelvic pain, problems with intercourse or other gynecologic concerns.    Gynecologic History Patient's last menstrual period was 08/23/2017. Contraception: condoms Last Pap: 07/2014 ASCUS +HPV, colposcopy showed CIN I.  Obstetric History OB History  Gravida Para Term Preterm AB Living  2 1 1   1 1   SAB TAB Ectopic Multiple Live Births          1    # Outcome Date GA Lbr Len/2nd Weight Sex Delivery Anes PTL Lv  2 Term 2003 [redacted]w[redacted]d   F Vag-Spont  N LIV  1 AB               History reviewed. No pertinent past medical history.  Past Surgical History:  Procedure Laterality Date  . 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   No current facility-administered medications on file prior to visit.     No Known Allergies  Social History   Socioeconomic History  . Marital status: Single    Spouse name: Not on file  . Number of children: Not on file  . Years of education: Not on file  . Highest education level: Not on file  Social Needs  . Financial resource strain: Not on file  . Food insecurity - worry: Not on file  . Food insecurity - inability: Not on file  . Transportation needs - medical: Not on file  . Transportation needs - non-medical: Not on file  Occupational History  . Not on file  Tobacco Use  . Smoking status: Current Every Day Smoker    Packs/day: 0.50    Years: 10.00    Pack years: 5.00  . Smokeless tobacco: Never Used  . Tobacco comment: Has not tried any quit aids/tn  Substance and Sexual Activity  . Alcohol use: Yes    Alcohol/week: 0.0 oz    Comment: weekends  . Drug use: No  .  Sexual activity: Yes    Partners: Male  Other Topics Concern  . Not on file  Social History Narrative  . Not on file    Family History  Problem Relation Age of Onset  . Thyroid disease Mother   . Hypertension Mother   . Cancer Father        leukemia  . Stroke Father   . Cancer Maternal Aunt        Breast Cancer    The following portions of the patient's history were reviewed and updated as appropriate: allergies, current medications, past family history, past medical history, past social history, past surgical history and problem list.  Review of Systems Pertinent items noted in HPI and remainder of comprehensive ROS otherwise negative.   Objective:  BP 128/80   Pulse 72   Wt 156 lb (70.8 kg)   LMP 08/23/2017   BMI 28.53 kg/m  CONSTITUTIONAL: Well-developed, well-nourished female in no acute distress.  HENT:  Normocephalic, atraumatic, External right and left ear normal. Oropharynx is clear and moist EYES: Conjunctivae and EOM are normal. Pupils are equal, round, and reactive to light. No scleral icterus.  NECK: Normal range of motion, supple, no masses.  Normal thyroid.  SKIN: Skin is warm and dry. No rash noted. Not diaphoretic. No erythema. No pallor. NEUROLOGIC: Alert and oriented to person, place, and time. Normal reflexes, muscle tone coordination. No cranial nerve deficit noted. PSYCHIATRIC: Normal mood and affect. Normal behavior. Normal judgment and thought content. CARDIOVASCULAR: Normal heart rate noted, regular rhythm RESPIRATORY: Clear to auscultation bilaterally. Effort and breath sounds normal, no problems with respiration noted. BREASTS: Symmetric in size. No masses, skin changes, nipple drainage, or lymphadenopathy. ABDOMEN: Soft, normal bowel sounds, no distention noted.  No tenderness, rebound or guarding.  PELVIC: Normal appearing external genitalia; normal appearing vaginal mucosa and cervix.  No abnormal discharge noted.  Pap smear obtained.  Normal  uterine size, no other palpable masses, no uterine or adnexal tenderness. MUSCULOSKELETAL: Normal range of motion. No tenderness.  No cyanosis, clubbing, or edema.  2+ distal pulses.   Assessment and Plan:  1. Well woman exam with routine gynecological exam - Cytology - PAP - HIV antibody - RPR - Hepatitis C antibody - Hepatitis B surface antigen Will follow up results of pap smear and manage accordingly. Routine preventative health maintenance measures emphasized. Please refer to After Visit Summary for other counseling recommendations.    Verita Schneiders, MD, [place] Attending Obstetrician & Gynecologist, [place] for [facility]

## 2017-08-28 NOTE — Progress Notes (Signed)
Last pap 2015 Std testing

## 2017-08-28 NOTE — Patient Instructions (Signed)
Preventive Care 18-39 Years, Female Preventive care refers to lifestyle choices and visits with your health care provider that can promote health and wellness. What does preventive care include?  A yearly physical exam. This is also called an annual well check.  Dental exams once or twice a year.  Routine eye exams. Ask your health care provider how often you should have your eyes checked.  Personal lifestyle choices, including: ? Daily care of your teeth and gums. ? Regular physical activity. ? Eating a healthy diet. ? Avoiding tobacco and drug use. ? Limiting alcohol use. ? Practicing safe sex. ? Taking vitamin and mineral supplements as recommended by your health care provider. What happens during an annual well check? The services and screenings done by your health care provider during your annual well check will depend on your age, overall health, lifestyle risk factors, and family history of disease. Counseling Your health care provider may ask you questions about your:  Alcohol use.  Tobacco use.  Drug use.  Emotional well-being.  Home and relationship well-being.  Sexual activity.  Eating habits.  Work and work Statistician.  Method of birth control.  Menstrual cycle.  Pregnancy history.  Screening You may have the following tests or measurements:  Height, weight, and BMI.  Diabetes screening. This is done by checking your blood sugar (glucose) after you have not eaten for a while (fasting).  Blood pressure.  Lipid and cholesterol levels. These may be checked every 5 years starting at age 66.  Skin check.  Hepatitis C blood test.  Hepatitis B blood test.  Sexually transmitted disease (STD) testing.  BRCA-related cancer screening. This may be done if you have a family history of breast, ovarian, tubal, or peritoneal cancers.  Pelvic exam and Pap test. This may be done every 3 years starting at age 40. Starting at age 59, this may be done every 5  years if you have a Pap test in combination with an HPV test.  Discuss your test results, treatment options, and if necessary, the need for more tests with your health care provider. Vaccines Your health care provider may recommend certain vaccines, such as:  Influenza vaccine. This is recommended every year.  Tetanus, diphtheria, and acellular pertussis (Tdap, Td) vaccine. You may need a Td booster every 10 years.  Varicella vaccine. You may need this if you have not been vaccinated.  HPV vaccine. If you are 69 or younger, you may need three doses over 6 months.  Measles, mumps, and rubella (MMR) vaccine. You may need at least one dose of MMR. You may also need a second dose.  Pneumococcal 13-valent conjugate (PCV13) vaccine. You may need this if you have certain conditions and were not previously vaccinated.  Pneumococcal polysaccharide (PPSV23) vaccine. You may need one or two doses if you smoke cigarettes or if you have certain conditions.  Meningococcal vaccine. One dose is recommended if you are age 27-21 years and a first-year college student living in a residence hall, or if you have one of several medical conditions. You may also need additional booster doses.  Hepatitis A vaccine. You may need this if you have certain conditions or if you travel or work in places where you may be exposed to hepatitis A.  Hepatitis B vaccine. You may need this if you have certain conditions or if you travel or work in places where you may be exposed to hepatitis B.  Haemophilus influenzae type b (Hib) vaccine. You may need this if  you have certain risk factors.  Talk to your health care provider about which screenings and vaccines you need and how often you need them. This information is not intended to replace advice given to you by your health care provider. Make sure you discuss any questions you have with your health care provider. Document Released: 10/28/2001 Document Revised: 05/21/2016  Document Reviewed: 07/03/2015 Elsevier Interactive Patient Education  2017 [employer].

## 2017-08-29 LAB — HIV ANTIBODY (ROUTINE TESTING W REFLEX): HIV SCREEN 4TH GENERATION: NONREACTIVE

## 2017-08-29 LAB — HEPATITIS C ANTIBODY: Hep C Virus Ab: <0.1 s/co ratio (ref 0.0–0.9)

## 2017-08-29 LAB — RPR: RPR: NONREACTIVE

## 2017-08-29 LAB — HEPATITIS B SURFACE ANTIGEN: Hepatitis B Surface Ag: NEGATIVE

## 2017-09-02 LAB — CYTOLOGY - PAP
Chlamydia: NEGATIVE
Diagnosis: UNDETERMINED — AB
HPV 16/18/45 GENOTYPING: NEGATIVE
HPV: DETECTED — AB
Neisseria Gonorrhea: NEGATIVE
TRICH (WINDOWPATH): NEGATIVE

## 2017-10-02 ENCOUNTER — Ambulatory Visit (INDEPENDENT_AMBULATORY_CARE_PROVIDER_SITE_OTHER): Payer: Medicaid Other | Admitting: Obstetrics & Gynecology

## 2017-10-02 ENCOUNTER — Encounter: Payer: Self-pay | Admitting: Obstetrics & Gynecology

## 2017-10-02 VITALS — BP 124/83 | HR 103 | Ht 61.0 in | Wt 155.0 lb

## 2017-10-02 DIAGNOSIS — R87610 Atypical squamous cells of undetermined significance on cytologic smear of cervix (ASC-US): Secondary | ICD-10-CM

## 2017-10-02 DIAGNOSIS — R87810 Cervical high risk human papillomavirus (HPV) DNA test positive: Secondary | ICD-10-CM

## 2017-10-02 NOTE — Progress Notes (Signed)
    GYNECOLOGY CLINIC COLPOSCOPY PROCEDURE NOTE  37 y.o. G2P1011 here for colposcopy for ASCUS with POSITIVE high risk HPV pap smear on 08/28/2017.  Previous history of CIN I in 2015, no follow up paps. Discussed role for HPV in cervical dysplasia, need for surveillance.  Patient given informed consent, signed copy in the chart, time out was performed.  Placed in lithotomy position. Cervix viewed with speculum and colposcope after application of Lugol's solution (patient declined usage of acetic acid due to irritation in previous colposcopy).   Colposcopy adequate? Yes Areas without Lugol uptake noted at 12 and 6 o'clock; corresponding biopsies obtained.  ECC specimen obtained. All specimens were labeled and sent to pathology.   Patient was given post procedure instructions.  Will follow up pathology and manage accordingly; patient will be contacted with results and recommendations.  Routine preventative health maintenance measures emphasized.    Verita Schneiders, MD, [place] for [employer], [place]

## 2017-10-02 NOTE — Patient Instructions (Signed)

## 2017-10-09 ENCOUNTER — Telehealth: Payer: Self-pay

## 2017-10-09 NOTE — Telephone Encounter (Signed)
Called patient no answer or voice mail to leave a message regarding need for leep vs cryotherapy.

## 2017-10-09 NOTE — Telephone Encounter (Signed)
-----   Message from Osborne Oman, MD sent at 10/09/2017  8:23 AM EST ----- Diagnosis 1. Cervix, biopsy, 12:00 o'clock -BENIGN SQUAMOUS MUCOSA. -NO CERVICAL TRANSFORMATION ZONE MUCOSA PRESENT. -NO DYSPLASIA OR MALIGNANCY IDENTIFIED. 2. 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 -NO INVASIVE NEOPLASM IDENTIFIED. -SEE COMMENT. 3. Endocervix, curettage -MUCOID MATERIAL AND BLOOD WITH RARE BENIGN  Patient needs cryotherapy vs LEEP. Needs to come in for counseling about these modalities.  Please call to inform patient of results and recommendations and make appointment.

## 2018-04-08 DIAGNOSIS — J0100 Acute maxillary sinusitis, unspecified: Secondary | ICD-10-CM | POA: Diagnosis not present

## 2018-05-28 ENCOUNTER — Encounter: Payer: Self-pay | Admitting: Primary Care

## 2018-05-28 ENCOUNTER — Ambulatory Visit: Payer: BLUE CROSS/BLUE SHIELD | Admitting: Primary Care

## 2018-05-28 DIAGNOSIS — Z91018 Allergy to other foods: Secondary | ICD-10-CM | POA: Diagnosis not present

## 2018-05-28 DIAGNOSIS — N83209 Unspecified ovarian cyst, unspecified side: Secondary | ICD-10-CM

## 2018-05-28 LAB — LIPID PANEL
CHOLESTEROL: 96 mg/dL (ref 0–200)
HDL: 50.1 mg/dL (ref >39.00)
LDL Cholesterol: 31 mg/dL (ref 0–99)
NonHDL: 46.15
TRIGLYCERIDES: 78 mg/dL (ref 0.0–149.0)
Total CHOL/HDL Ratio: 2
VLDL: 15.6 mg/dL (ref 0.0–40.0)

## 2018-05-28 LAB — COMPREHENSIVE METABOLIC PANEL
ALBUMIN: 3.8 g/dL (ref 3.5–5.2)
ALK PHOS: 48 U/L (ref 39–117)
ALT: 11 U/L (ref 0–35)
AST: 13 U/L (ref 0–37)
BUN: 8 mg/dL (ref 6–23)
CALCIUM: 9.1 mg/dL (ref 8.4–10.5)
CO2: 25 mEq/L (ref 19–32)
Chloride: 106 mEq/L (ref 96–112)
Creatinine, Ser: 0.7 mg/dL (ref 0.40–1.20)
GFR: 119.74 mL/min (ref >60.00)
Glucose, Bld: 94 mg/dL (ref 70–99)
POTASSIUM: 3.5 meq/L (ref 3.5–5.1)
Sodium: 139 mEq/L (ref 135–145)
TOTAL PROTEIN: 7.7 g/dL (ref 6.0–8.3)
Total Bilirubin: 0.3 mg/dL (ref 0.2–1.2)

## 2018-05-28 LAB — HEMOGLOBIN A1C: HEMOGLOBIN A1C: 5.9 % (ref 4.6–6.5)

## 2018-05-28 MED ORDER — EPINEPHRINE 0.3 MG/0.3ML IJ SOAJ: 0.3000 mg | Freq: Once | INTRAMUSCULAR | 0 refills | Status: AC

## 2018-05-28 NOTE — Progress Notes (Signed)
Subjective:    Patient ID: Virginia Bird, female    DOB: 05/21/1981, 37 y.o.   MRN: [id number]  HPI  Virginia Bird is a 37 year old female who presents today to establish care and discuss the problems mentioned below. Will obtain old records.  1) Ovarian Cyst: History of ovarian cysts bilaterally since 2010. Her last imaging was an ultrasound in 2013 which was normal. She experiences pain to left suprapubic region with radiation of pain down to her left lower extremity. This will occur 2-3 days after her menstrual cycle and will last for 2-3 days. She'll take Aleve with improvement.   2) Food Allergies: Has noticed reactions after eating cherries, almonds, apples. She will experience itching to her oral cavity. She denies throat tightness, shortness of breath. She avoids these foods. She did have an incident of throat tightness, palpitations, lip swelling in July 2018 that occurred 20 minutes after eating chicken and broccoli at a local [employer]. She was treated in the emergency department for this. She has since eaten chicken and broccoli at another [employer] without those symptoms. She does not have an epi pen.  BP Readings from Last 3 Encounters:  05/28/18 118/78  10/02/17 124/83  08/28/17 128/80     Review of Systems  HENT: Negative for trouble swallowing.   Respiratory: Negative for shortness of breath and wheezing.   Cardiovascular: Negative for chest pain.  Genitourinary:       History of ovarian cysts. Left suprapubic pain monthly after menstrual cycles.   Skin: Negative for color change.  Allergic/Immunologic: Positive for food allergies.       Past Medical History:  Diagnosis Date  . Abnormal Pap smear of cervix   . Chickenpox   . UTI (urinary tract infection)      Social History   Socioeconomic History  . Marital status: Single    Spouse name: Not on file  . Number of children: Not on file  . Years of education: Not on file  . Highest  education level: Not on file  Occupational History  . Not on file  Social Needs  . Financial resource strain: Not on file  . Food insecurity:    Worry: Not on file    Inability: Not on file  . Transportation needs:    Medical: Not on file    Non-medical: Not on file  Tobacco Use  . Smoking status: Current Every Day Smoker    Packs/day: 0.50    Years: 10.00    Pack years: 5.00  . Smokeless tobacco: Never Used  . Tobacco comment: Has not tried any quit aids/tn  Substance and Sexual Activity  . Alcohol use: Yes    Alcohol/week: 0.0 standard drinks    Comment: weekends  . Drug use: No  . Sexual activity: Yes    Partners: Male  Lifestyle  . Physical activity:    Days per week: Not on file    Minutes per session: Not on file  . Stress: Not on file  Relationships  . Social connections:    Talks on phone: Not on file    Gets together: Not on file    Attends religious service: Not on file    Active member of club or organization: Not on file    Attends meetings of clubs or organizations: Not on file    Relationship status: Not on file  . Intimate partner violence:    Fear of current or ex partner: Not on  file    Emotionally abused: Not on file    Physically abused: Not on file    Forced sexual activity: Not on file  Other Topics Concern  . Not on file  Social History Narrative   Single.    1 daughter.    Works at [employer].   Enjoys going to [employer].     Past Surgical History:  Procedure Laterality Date  . 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 Thyroid disease Mother   . Hypertension Mother   . Cancer Father        leukemia  . Stroke Father   . Cancer Maternal Aunt        Breast Cancer  . Asthma Sister   . Miscarriages / Stillbirths Sister     No Known Allergies  No current outpatient medications on file prior to visit.   No current  facility-administered medications on file prior to visit.     BP 118/78   Pulse 83   Temp 98.3 F (36.8 C) (Oral)   Ht 5' 1.5" (1.562 m)   Wt 157 lb 4 oz (71.3 kg)   LMP 05/05/2018   SpO2 99%   BMI 29.23 kg/m    Objective:   Physical Exam  Constitutional: She appears well-nourished.  Neck: Neck supple.  Cardiovascular: Normal rate and regular rhythm.  Respiratory: Effort normal and breath sounds normal. She has no wheezes.  GI: Soft. Bowel sounds are normal. There is no tenderness.  Skin: Skin is warm and dry.  Psychiatric: She has a normal mood and affect.           Assessment & Plan:

## 2018-05-28 NOTE — Assessment & Plan Note (Signed)
Noted on imaging from 2010. Suspect cyclical monthly pain to be secondary. Will complete work up for PCOS. Discussed options for treating including weight loss, healthy diet, OCP's. She will discuss with her GYN during her next appointment.

## 2018-05-28 NOTE — Assessment & Plan Note (Signed)
Reactions to cherries, apples, almonds in the past. Also with anaphylaxis after eating chinese food.   Referral placed for allergy testing.  Rx for Epi Pen provided, discussed instructions for use.  Continue to avoid known trigger foods.

## 2018-05-28 NOTE — Patient Instructions (Signed)
Stop by the lab prior to leaving today. I will notify you of your results once received.   You will be contacted regarding your referral to the allergy doctor for formal testing.  Please let us know if you have not been contacted within one week.   Use the Epi Pen if you notice throat closure, shortness of breath, wheezing, etc.  It was a pleasure to meet you today! Please don't hesitate to call or message me with any questions. Welcome to [facility]!

## 2018-06-02 ENCOUNTER — Other Ambulatory Visit: Payer: Self-pay | Admitting: Primary Care

## 2018-06-02 DIAGNOSIS — N83209 Unspecified ovarian cyst, unspecified side: Secondary | ICD-10-CM

## 2018-06-11 ENCOUNTER — Ambulatory Visit
Admission: RE | Admit: 2018-06-11 | Discharge: 2018-06-11 | Disposition: A | Discharge status: Home / Self Care | Payer: BLUE CROSS/BLUE SHIELD | Source: Ambulatory Visit | Attending: Primary Care | Admitting: Primary Care

## 2018-06-11 DIAGNOSIS — N83209 Unspecified ovarian cyst, unspecified side: Secondary | ICD-10-CM

## 2018-06-11 DIAGNOSIS — D252 Subserosal leiomyoma of uterus: Secondary | ICD-10-CM | POA: Diagnosis not present

## 2018-06-11 DIAGNOSIS — D259 Leiomyoma of uterus, unspecified: Secondary | ICD-10-CM | POA: Insufficient documentation

## 2018-06-11 DIAGNOSIS — D250 Submucous leiomyoma of uterus: Secondary | ICD-10-CM | POA: Diagnosis not present

## 2018-06-11 DIAGNOSIS — N83201 Unspecified ovarian cyst, right side: Secondary | ICD-10-CM | POA: Insufficient documentation

## 2018-06-28 ENCOUNTER — Encounter: Payer: Self-pay | Admitting: *Deleted

## 2018-07-08 DIAGNOSIS — J3089 Other allergic rhinitis: Secondary | ICD-10-CM | POA: Diagnosis not present

## 2018-07-08 DIAGNOSIS — J3081 Allergic rhinitis due to animal (cat) (dog) hair and dander: Secondary | ICD-10-CM | POA: Diagnosis not present

## 2018-07-08 DIAGNOSIS — J301 Allergic rhinitis due to pollen: Secondary | ICD-10-CM | POA: Diagnosis not present

## 2018-07-27 ENCOUNTER — Encounter: Payer: Self-pay | Admitting: Radiology

## 2018-09-09 ENCOUNTER — Other Ambulatory Visit (HOSPITAL_COMMUNITY)
Admission: RE | Admit: 2018-09-09 | Discharge: 2018-09-09 | Disposition: A | Discharge status: Home / Self Care | Payer: BLUE CROSS/BLUE SHIELD | Source: Ambulatory Visit | Attending: Obstetrics & Gynecology | Admitting: Obstetrics & Gynecology

## 2018-09-09 ENCOUNTER — Encounter: Payer: Self-pay | Admitting: Obstetrics & Gynecology

## 2018-09-09 ENCOUNTER — Ambulatory Visit (INDEPENDENT_AMBULATORY_CARE_PROVIDER_SITE_OTHER): Payer: BLUE CROSS/BLUE SHIELD | Admitting: Obstetrics & Gynecology

## 2018-09-09 VITALS — BP 105/71 | HR 96 | Ht 61.0 in | Wt 155.0 lb

## 2018-09-09 DIAGNOSIS — Z01419 Encounter for gynecological examination (general) (routine) without abnormal findings: Secondary | ICD-10-CM | POA: Diagnosis not present

## 2018-09-09 DIAGNOSIS — D219 Benign neoplasm of connective and other soft tissue, unspecified: Secondary | ICD-10-CM | POA: Insufficient documentation

## 2018-09-09 DIAGNOSIS — N870 Mild cervical dysplasia: Secondary | ICD-10-CM

## 2018-09-09 NOTE — Progress Notes (Signed)
GYNECOLOGY ANNUAL PREVENTATIVE CARE ENCOUNTER NOTE  Subjective:   Virginia Bird is a 37 y.o. G58P1011 female here for a routine annual gynecologic exam.  Current complaints: occasional pain with fibroids, recently diagnosed with small cyst but has no issues.   Denies abnormal vaginal bleeding, discharge, pelvic pain, problems with intercourse or other gynecologic concerns.    Gynecologic History Patient's last menstrual period was 08/18/2018. 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  Obstetric History OB History  Gravida Para Term Preterm AB Living  2 1 1  0 1 1  SAB TAB Ectopic Multiple Live Births  0 0 0 0 1    # Outcome Date GA Lbr Len/2nd Weight Sex Delivery Anes PTL Lv  2 Term 2003 [redacted]w[redacted]d   F Vag-Spont  N LIV  1 AB             Past Medical History:  Diagnosis Date  . Abnormal Pap smear of cervix   . Chickenpox   . UTI (urinary tract infection)     Past Surgical History:  Procedure Laterality Date  . 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   No current facility-administered medications on file prior to visit.     No Known Allergies  Social History:  reports that she has been smoking. She has a 5.00 pack-year smoking history. She has never used smokeless tobacco. She reports current alcohol use. She reports that she does not use drugs.  Family History  Problem Relation Age of Onset  . Thyroid disease Mother   . Hypertension Mother   . Cancer Father        leukemia  . Stroke Father   . Cancer Maternal Aunt        Breast Cancer  . Asthma Sister   . Miscarriages / [place] Sister     The following portions of the patient's history were reviewed and updated as appropriate: allergies, current medications, past family history, past medical history, past social history, past surgical history and  problem list.  Review of Systems Pertinent items noted in HPI and remainder of comprehensive ROS otherwise negative.   Objective:  BP 105/71   Pulse 96   Ht 5\' 1"  (1.549 m)   Wt 155 lb (70.3 kg)   LMP 08/18/2018   BMI 29.29 kg/m  CONSTITUTIONAL: Well-developed, well-nourished female in no acute distress.  HENT:  Normocephalic, atraumatic, External right and left ear normal. Oropharynx is clear and moist EYES: Conjunctivae and EOM are normal. Pupils are equal, round, and reactive to light. No scleral icterus.  NECK: Normal range of motion, supple, no masses.  Normal thyroid.  SKIN: Skin is warm and dry. No rash noted. Not diaphoretic. No erythema. No pallor. MUSCULOSKELETAL: Normal range of motion. No tenderness.  No cyanosis, clubbing, or edema.  2+ distal pulses. NEUROLOGIC: Alert and oriented to person, place, and time. Normal reflexes, muscle tone coordination. No cranial nerve deficit noted. PSYCHIATRIC: Normal mood and affect. Normal behavior. Normal judgment and thought content. CARDIOVASCULAR: Normal heart rate noted, regular rhythm RESPIRATORY: Clear to auscultation bilaterally. Effort and breath sounds normal, no problems with respiration noted. BREASTS: Symmetric in size. No masses, skin changes, nipple drainage, or lymphadenopathy. ABDOMEN: Soft, normal bowel sounds, no distention noted.  No tenderness, rebound or guarding.  PELVIC: Normal appearing external genitalia; normal appearing  vaginal mucosa and cervix.  No abnormal discharge noted.  Pap smear obtained.  Enlarged fibroid uterus, no other palpable masses, no uterine or adnexal tenderness.   Imaging: Result Date: 06/11/2018 CLINICAL DATA:  Left suprapubic pain.  History of ovarian cyst. EXAM: TRANSABDOMINAL AND TRANSVAGINAL ULTRASOUND OF PELVIS TECHNIQUE: Both transabdominal and transvaginal ultrasound examinations of the pelvis were performed. Transabdominal technique was performed for global imaging of the pelvis  including uterus, ovaries, adnexal regions, and pelvic cul-de-sac. It was necessary to proceed with endovaginal exam following the transabdominal exam to visualize the adnexa. COMPARISON:  02/05/2012 FINDINGS: Uterus Measurements: 10.7 x 6.5 x 6.7 cm. Heterogeneous echotexture. Multiple uterine fibroids. The largest is a right posterior subserosal fibroid measuring up to 6.7 cm. Central submucosal fibroid measures up to 2.4 cm. Endometrium Thickness: 18 mm in thickness where visualized. No focal abnormality visualized. Right ovary Measurements: 4.3 x 3.0 x 2.2 cm. Hypoechoic mass with internal low level echoes measures up to 3 cm. Left ovary Measurements: Not definitively visualized.  No adnexal mass seen. Other findings No abnormal free fluid. IMPRESSION: Multiple uterine fibroids, the largest 6.7 cm off the posterior right fundus. There is a central fibroid measuring up to 2.4 cm. 3 cm cystic area in the right ovary with low level internal echoes most compatible with hemorrhagic cyst or endometrioma. Electronically Signed   By: Rolm Baptise M.D.   On: 06/11/2018 10:52    Assessment and Plan:  1. Fibroids No acute symptoms, will continue to monitor.   2. Mild dysplasia of cervix (CIN I) 3. Well woman exam with routine gynecological exam - Cytology - PAP done. Will follow up results of pap smear and manage accordingly. Routine preventative health maintenance measures emphasized. Please refer to After Visit Summary for other counseling recommendations.    Verita Schneiders, MD, [place] for [employer] [facility] [employer], [place]

## 2018-09-09 NOTE — Patient Instructions (Signed)
Td Vaccine (Tetanus and Diphtheria): What You Need to Know 1. Why get vaccinated? Tetanus  and diphtheria are very serious diseases. They are rare in the [place] today, but people who do become infected often have severe complications. Td vaccine is used to protect adolescents and adults from both of these diseases. Both tetanus and diphtheria are infections caused by bacteria. Diphtheria spreads from person to person through coughing or sneezing. Tetanus-causing bacteria enter the body through cuts, scratches, or wounds. TETANUS (Lockjaw) causes painful muscle tightening and stiffness, usually all over the body.  It can lead to tightening of muscles in the head and neck so you can't open your mouth, swallow, or sometimes even breathe. Tetanus kills about 1 out of every 10 people who are infected even after receiving the best medical care. DIPHTHERIA can cause a thick coating to form in the back of the throat.  It can lead to breathing problems, paralysis, heart failure, and death. Before vaccines, as many as 200,000 cases of diphtheria and hundreds of cases of tetanus were reported in the [place] each year. Since vaccination began, reports of cases for both diseases have dropped by about 99%. 2. Td vaccine Td vaccine can protect adolescents and adults from tetanus and diphtheria. Td is usually given as a booster dose every 10 years but it can also be given earlier after a severe and dirty wound or burn. Another vaccine, called Tdap, which protects against pertussis in addition to tetanus and diphtheria, is sometimes recommended instead of Td vaccine. Your doctor or the person giving you the vaccine can give you more information. Td may safely be given at the same time as other vaccines. 3. Some people should not get this vaccine  A person who has ever had a life-threatening allergic reaction after a previous dose of any tetanus or diphtheria containing vaccine, OR has a severe allergy  to any part of this vaccine, should not get Td vaccine. Tell the person giving the vaccine about any severe allergies.  Talk to your doctor if you: ? had severe pain or swelling after any vaccine containing diphtheria or tetanus, ? ever had a condition called Guillain Barr Syndrome (GBS), ? aren't feeling well on the day the shot is scheduled. 4. Risks of a vaccine reaction With any medicine, including vaccines, there is a chance of side effects. These are usually mild and go away on their own. Serious reactions are also possible but are rare. Most people who get Td vaccine do not have any problems with it. Mild Problems following Td vaccine: (Did not interfere with activities)  Pain where the shot was given (about 8 people in 10)  Redness or swelling where the shot was given (about 1 person in 4)  Mild fever (rare)  Headache (about 1 person in 4)  Tiredness (about 1 person in 4) Moderate Problems following Td vaccine: (Interfered with activities, but did not require medical attention)  Fever over 102F (rare) Severe Problems following Td vaccine: (Unable to perform usual activities; required medical attention)  Swelling, severe pain, bleeding and/or redness in the arm where the shot was given (rare). Problems that could happen after any vaccine:  People sometimes faint after a medical procedure, including vaccination. Sitting or lying down for about 15 minutes can help prevent fainting, and injuries caused by a fall. Tell your doctor if you feel dizzy, or have vision changes or ringing in the ears.  Some people get severe pain in the shoulder and have   difficulty moving the arm where a shot was given. This happens very rarely.  Any medication can cause a severe allergic reaction. Such reactions from a vaccine are very rare, estimated at fewer than 1 in a million doses, and would happen within a few minutes to a few hours after the vaccination. As with any medicine, there is a  very remote chance of a vaccine causing a serious injury or death. The safety of vaccines is always being monitored. For more information, visit: http://www.aguilar.org/ 5. What if there is a serious reaction? What should I look for?  Look for anything that concerns you, such as signs of a severe allergic reaction, very high fever, or unusual behavior. Signs of a severe allergic reaction can include hives, swelling of the face and throat, difficulty breathing, a fast heartbeat, dizziness, and weakness. These would usually start a few minutes to a few hours after the vaccination. What should I do?  If you think it is a severe allergic reaction or other emergency that can't wait, call 9-1-1 or get the person to the nearest hospital. Otherwise, call your doctor.  Afterward, the reaction should be reported to the Vaccine Adverse Event Reporting System (VAERS). Your doctor might file this report, or you can do it yourself through the VAERS web site at www.vaers.SamedayNews.es, or by calling 763-726-3583. VAERS does not give medical advice. 6. The National Vaccine Injury Compensation Program The [employer] Vaccine Injury Compensation Program (VICP) is a federal program that was created to compensate people who may have been injured by certain vaccines. Persons who believe they may have been injured by a vaccine can learn about the program and about filing a claim by calling 517-202-2453 or visiting the [place] website at GoldCloset.com.ee. There is a time limit to file a claim for compensation. 7. How can I learn more?  Ask your doctor. He or she can give you the vaccine package insert or suggest other sources of information.  Call your local or state health department.  Contact the Centers for Disease Control and Prevention (CDC): ? Call 517-550-7152 (1-800-CDC-INFO) ? Visit CDC's website at http://hunter.com/ Vaccine Information Statement Td Vaccine (12/25/15) This information is  not intended to replace advice given to you by your health care provider. Make sure you discuss any questions you have with your health care provider. Document Released: 06/29/2006 Document Revised: 04/19/2018 Document Reviewed: 04/19/2018 Elsevier Interactive Patient Education  2019 [employer].   Tdap Vaccine (Tetanus, Diphtheria and Pertussis): What You Need to Know 1. Why get vaccinated? Tetanus, diphtheria and pertussis are very serious diseases. Tdap vaccine can protect [place] from these diseases. And, Tdap vaccine given to pregnant women can protect newborn babies against pertussis.Marland Kitchen TETANUS (Lockjaw) is rare in the [place] today. It causes painful muscle tightening and stiffness, usually all over the body.  It can lead to tightening of muscles in the head and neck so you can't open your mouth, swallow, or sometimes even breathe. Tetanus kills about 1 out of 10 people who are infected even after receiving the best medical care. DIPHTHERIA is also rare in the [place] today. It can cause a thick coating to form in the back of the throat.  It can lead to breathing problems, heart failure, paralysis, and death. PERTUSSIS (Whooping Cough) causes severe coughing spells, which can cause difficulty breathing, vomiting and disturbed sleep.  It can also lead to weight loss, incontinence, and rib fractures. Up to 2 in 100 adolescents and 5 in 100 adults with  pertussis are hospitalized or have complications, which could include pneumonia or death. These diseases are caused by bacteria. Diphtheria and pertussis are spread from person to person through secretions from coughing or sneezing. Tetanus enters the body through cuts, scratches, or wounds. Before vaccines, as many as 200,000 cases of diphtheria, 200,000 cases of pertussis, and hundreds of cases of tetanus, were reported in the [place] each year. Since vaccination began, reports of cases for tetanus and diphtheria have dropped by  about 99% and for pertussis by about 80%. 2. Tdap vaccine Tdap vaccine can protect adolescents and adults from tetanus, diphtheria, and pertussis. One dose of Tdap is routinely given at age 38 or 21. People who did not get Tdap at that age should get it as soon as possible. Tdap is especially important for healthcare professionals and anyone having close contact with a baby younger than 12 months. Pregnant women should get a dose of Tdap during every pregnancy, to protect the newborn from pertussis. Infants are most at risk for severe, life-threatening complications from pertussis. Another vaccine, called Td, protects against tetanus and diphtheria, but not pertussis. A Td booster should be given every 10 years. Tdap may be given as one of these boosters if you have never gotten Tdap before. Tdap may also be given after a severe cut or burn to prevent tetanus infection. Your doctor or the person giving you the vaccine can give you more information. Tdap may safely be given at the same time as other vaccines. 3. Some people should not get this vaccine  A person who has ever had a life-threatening allergic reaction after a previous dose of any diphtheria, tetanus or pertussis containing vaccine, OR has a severe allergy to any part of this vaccine, should not get Tdap vaccine. Tell the person giving the vaccine about any severe allergies.  Anyone who had coma or long repeated seizures within 7 days after a childhood dose of DTP or DTaP, or a previous dose of Tdap, should not get Tdap, unless a cause other than the vaccine was found. They can still get Td.  Talk to your doctor if you: ? have seizures or another nervous system problem, ? had severe pain or swelling after any vaccine containing diphtheria, tetanus or pertussis, ? ever had a condition called Guillain-Barr Syndrome (GBS), ? aren't feeling well on the day the shot is scheduled. 4. Risks With any medicine, including vaccines, there is a  chance of side effects. These are usually mild and go away on their own. Serious reactions are also possible but are rare. Most people who get Tdap vaccine do not have any problems with it. Mild problems following Tdap (Did not interfere with activities)  Pain where the shot was given (about 3 in 4 adolescents or 2 in 3 adults)  Redness or swelling where the shot was given (about 1 person in 5)  Mild fever of at least 100.48F (up to about 1 in 25 adolescents or 1 in 100 adults)  Headache (about 3 or 4 people in 10)  Tiredness (about 1 person in 3 or 4)  Nausea, vomiting, diarrhea, stomach ache (up to 1 in 4 adolescents or 1 in 10 adults)  Chills, sore joints (about 1 person in 10)  Body aches (about 1 person in 3 or 4)  Rash, swollen glands (uncommon) Moderate problems following Tdap (Interfered with activities, but did not require medical attention)  Pain where the shot was given (up to 1 in 5 or 6)  Redness or swelling where the shot was given (up to about 1 in 16 adolescents or 1 in 12 adults)  Fever over 102F (about 1 in 100 adolescents or 1 in 250 adults)  Headache (about 1 in 7 adolescents or 1 in 10 adults)  Nausea, vomiting, diarrhea, stomach ache (up to 1 or 3 people in 100)  Swelling of the entire arm where the shot was given (up to about 1 in 500). Severe problems following Tdap (Unable to perform usual activities; required medical attention)  Swelling, severe pain, bleeding and redness in the arm where the shot was given (rare). Problems that could happen after any vaccine:  People sometimes faint after a medical procedure, including vaccination. Sitting or lying down for about 15 minutes can help prevent fainting, and injuries caused by a fall. Tell your doctor if you feel dizzy, or have vision changes or ringing in the ears.  Some people get severe pain in the shoulder and have difficulty moving the arm where a shot was given. This happens very  rarely.  Any medication can cause a severe allergic reaction. Such reactions from a vaccine are very rare, estimated at fewer than 1 in a million doses, and would happen within a few minutes to a few hours after the vaccination. As with any medicine, there is a very remote chance of a vaccine causing a serious injury or death. The safety of vaccines is always being monitored. For more information, visit: http://www.aguilar.org/ 5. What if there is a serious problem? What should I look for?  Look for anything that concerns you, such as signs of a severe allergic reaction, very high fever, or unusual behavior. Signs of a severe allergic reaction can include hives, swelling of the face and throat, difficulty breathing, a fast heartbeat, dizziness, and weakness. These would usually start a few minutes to a few hours after the vaccination. What should I do?  If you think it is a severe allergic reaction or other emergency that can't wait, call 9-1-1 or get the person to the nearest hospital. Otherwise, call your doctor.  Afterward, the reaction should be reported to the Vaccine Adverse Event Reporting System (VAERS). Your doctor might file this report, or you can do it yourself through the VAERS web site at www.vaers.SamedayNews.es, or by calling 614-027-7374. VAERS does not give medical advice. 6. The National Vaccine Injury Compensation Program The [employer] Vaccine Injury Compensation Program (VICP) is a federal program that was created to compensate people who may have been injured by certain vaccines. Persons who believe they may have been injured by a vaccine can learn about the program and about filing a claim by calling 6015138582 or visiting the [place] website at GoldCloset.com.ee. There is a time limit to file a claim for compensation. 7. How can I learn more?  Ask your doctor. He or she can give you the vaccine package insert or suggest other sources of information.  Call  your local or state health department.  Contact the Centers for Disease Control and Prevention (CDC): ? Call 636-457-1143 (1-800-CDC-INFO) or ? Visit CDC's website at http://hunter.com/ Vaccine Information Statement Tdap Vaccine (11/08/2013) This information is not intended to replace advice given to you by your health care provider. Make sure you discuss any questions you have with your health care provider. Document Released: 03/02/2012 Document Revised: 04/19/2018 Document Reviewed: 04/19/2018 Elsevier Interactive Patient Education  2019 [employer].

## 2018-09-14 LAB — CYTOLOGY - PAP
DIAGNOSIS: NEGATIVE
HPV: NOT DETECTED

## 2019-07-05 ENCOUNTER — Encounter: Payer: Self-pay | Admitting: Radiology

## 2019-10-14 DIAGNOSIS — H04123 Dry eye syndrome of bilateral lacrimal glands: Secondary | ICD-10-CM | POA: Diagnosis not present

## 2019-10-14 DIAGNOSIS — H16143 Punctate keratitis, bilateral: Secondary | ICD-10-CM | POA: Diagnosis not present

## 2019-11-08 ENCOUNTER — Other Ambulatory Visit: Payer: Self-pay

## 2019-11-08 ENCOUNTER — Encounter: Payer: Self-pay | Admitting: Primary Care

## 2019-11-08 ENCOUNTER — Ambulatory Visit (INDEPENDENT_AMBULATORY_CARE_PROVIDER_SITE_OTHER): Payer: Medicaid Other | Admitting: Primary Care

## 2019-11-08 VITALS — BP 110/68 | HR 104 | Temp 96.8°F | Ht 61.0 in | Wt 151.8 lb

## 2019-11-08 DIAGNOSIS — Z91018 Allergy to other foods: Secondary | ICD-10-CM

## 2019-11-08 DIAGNOSIS — R87810 Cervical high risk human papillomavirus (HPV) DNA test positive: Secondary | ICD-10-CM | POA: Diagnosis not present

## 2019-11-08 DIAGNOSIS — Z1231 Encounter for screening mammogram for malignant neoplasm of breast: Secondary | ICD-10-CM | POA: Diagnosis not present

## 2019-11-08 DIAGNOSIS — Z0000 Encounter for general adult medical examination without abnormal findings: Secondary | ICD-10-CM | POA: Diagnosis not present

## 2019-11-08 DIAGNOSIS — D219 Benign neoplasm of connective and other soft tissue, unspecified: Secondary | ICD-10-CM

## 2019-11-08 DIAGNOSIS — R87610 Atypical squamous cells of undetermined significance on cytologic smear of cervix (ASC-US): Secondary | ICD-10-CM | POA: Diagnosis not present

## 2019-11-08 LAB — COMPREHENSIVE METABOLIC PANEL
ALT: 8 U/L (ref 0–35)
AST: 13 U/L (ref 0–37)
Albumin: 3.6 g/dL (ref 3.5–5.2)
Alkaline Phosphatase: 56 U/L (ref 39–117)
BUN: 7 mg/dL (ref 6–23)
CO2: 29 mEq/L (ref 19–32)
Calcium: 8.9 mg/dL (ref 8.4–10.5)
Chloride: 102 mEq/L (ref 96–112)
Creatinine, Ser: 0.72 mg/dL (ref 0.40–1.20)
GFR: 108.25 mL/min (ref >60.00)
Glucose, Bld: 92 mg/dL (ref 70–99)
Potassium: 3.5 mEq/L (ref 3.5–5.1)
Sodium: 138 mEq/L (ref 135–145)
Total Bilirubin: 0.4 mg/dL (ref 0.2–1.2)
Total Protein: 7 g/dL (ref 6.0–8.3)

## 2019-11-08 LAB — CBC
HCT: 28.2 % — ABNORMAL LOW (ref 36.0–46.0)
Hemoglobin: 8.4 g/dL — ABNORMAL LOW (ref 12.0–15.0)
MCHC: 29.7 g/dL — ABNORMAL LOW (ref 30.0–36.0)
MCV: 60.3 fl — ABNORMAL LOW (ref 78.0–100.0)
Platelets: 499 10*3/uL — ABNORMAL HIGH (ref 150.0–400.0)
RBC: 4.67 Mil/uL (ref 3.87–5.11)
RDW: 18.1 % — ABNORMAL HIGH (ref 11.5–15.5)
WBC: 7.5 10*3/uL (ref 4.0–10.5)

## 2019-11-08 LAB — LIPID PANEL
Cholesterol: 117 mg/dL (ref 0–200)
HDL: 48.6 mg/dL (ref >39.00)
LDL Cholesterol: 44 mg/dL (ref 0–99)
NonHDL: 68.03
Total CHOL/HDL Ratio: 2
Triglycerides: 119 mg/dL (ref 0.0–149.0)
VLDL: 23.8 mg/dL (ref 0.0–40.0)

## 2019-11-08 MED ORDER — EPINEPHRINE 0.3 MG/0.3ML IJ SOAJ
0.3000 mg | INTRAMUSCULAR | 0 refills | Status: DC | PRN
Start: 2019-11-08 — End: 2021-03-05

## 2019-11-08 NOTE — Assessment & Plan Note (Signed)
Tetanus and influenza vaccinations due, she declines. Pap smear UTD, due again this year, follows with GYN.  Mammogram due, orders placed.  Encouraged a healthy diet, regular exercise. Exam today unremarkable. Labs pending.

## 2019-11-08 NOTE — Assessment & Plan Note (Signed)
Following with GYN. Pap smear from 2019 negative. She has follow up scheduled with GYN for March 2021.

## 2019-11-08 NOTE — Assessment & Plan Note (Signed)
No recent problems. Rx for Epi Pen provided.

## 2019-11-08 NOTE — Progress Notes (Signed)
Subjective:    Patient ID: Virginia Bird, female    DOB: 1980-11-03, 39 y.o.   MRN: [id number]  HPI  This visit occurred during the SARS-CoV-2 public health emergency.  Safety protocols were in place, including screening questions prior to the visit, additional usage of staff PPE, and extensive cleaning of exam room while observing appropriate contact time as indicated for disinfecting solutions.   Virginia Bird is a 39 year old female who presents today for complete physical.  Immunizations: -Tetanus: Unsure, declines  -Influenza: Declines   Diet: She endorses a fair diet.  Exercise: She is not exercising.   Eye exam: Completed in 2020, due again in March 2021 Dental exam: No recent exam.   Pap Smear: Completed in 2019 Mammogram: Never completed  BP Readings from Last 3 Encounters:  11/08/19 110/68  09/09/18 105/71  05/28/18 118/78     Review of Systems  Constitutional: Negative for unexpected weight change.  HENT: Negative for rhinorrhea.   Respiratory: Negative for cough and shortness of breath.   Cardiovascular: Negative for chest pain.  Gastrointestinal: Negative for constipation and diarrhea.  Genitourinary: Negative for menstrual problem.  Musculoskeletal: Negative for arthralgias and myalgias.  Skin: Negative for rash.  Allergic/Immunologic: Positive for food allergies.  Neurological: Negative for dizziness, numbness and headaches.  Psychiatric/Behavioral: The patient is not nervous/anxious.        Past Medical History:  Diagnosis Date  . Abnormal Pap smear of cervix   . Chickenpox   . UTI (urinary tract infection)      Social History   Socioeconomic History  . Marital status: Single    Spouse name: Not on file  . Number of children: Not on file  . Years of education: Not on file  . Highest education level: Not on file  Occupational History  . Not on file  Tobacco Use  . Smoking status: Current Every Day Smoker    Packs/day: 0.50    Years:  10.00    Pack years: 5.00  . Smokeless tobacco: Never Used  . Tobacco comment: Has not tried any quit aids/tn  Substance and Sexual Activity  . Alcohol use: Yes    Alcohol/week: 0.0 standard drinks    Comment: weekends  . Drug use: No  . Sexual activity: Yes    Partners: Male  Other Topics Concern  . Not on file  Social History Narrative   Single.    1 daughter.    Works at [employer].   Enjoys going to [employer].    Social Determinants of Health   Financial Resource Strain:   . Difficulty of Paying Living Expenses: Not on file  Food Insecurity:   . Worried About Charity fundraiser in the Last Year: Not on file  . Ran Out of Food in the Last Year: Not on file  Transportation Needs:   . Lack of Transportation (Medical): Not on file  . Lack of Transportation (Non-Medical): Not on file  Physical Activity:   . Days of Exercise per Week: Not on file  . Minutes of Exercise per Session: Not on file  Stress:   . Feeling of Stress : Not on file  Social Connections:   . Frequency of Communication with Friends and Family: Not on file  . Frequency of Social Gatherings with Friends and Family: Not on file  . Attends Religious Services: Not on file  . Active Member of Clubs or Organizations: Not on file  . Attends Archivist  Meetings: Not on file  . Marital Status: Not on file  Intimate Partner Violence:   . Fear of Current or Ex-Partner: Not on file  . Emotionally Abused: Not on file  . Physically Abused: Not on file  . Sexually Abused: Not on file    Past Surgical History:  Procedure Laterality Date  . 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 Thyroid disease Mother   . Hypertension Mother   . Cancer Father        leukemia  . Stroke Father   . Cancer Maternal Aunt        Breast Cancer  . Asthma Sister   . Miscarriages / Stillbirths Sister      No Known Allergies  No current outpatient medications on file prior to visit.   No current facility-administered medications on file prior to visit.    BP 110/68   Pulse (!) 104   Temp (!) 96.8 F (36 C) (Temporal)   Ht 5\' 1"  (1.549 m)   Wt 151 lb 12 oz (68.8 kg)   LMP 10/25/2019   SpO2 98%   BMI 28.67 kg/m    Objective:   Physical Exam  Constitutional: She is oriented to person, place, and time. She appears well-nourished.  HENT:  Right Ear: Tympanic membrane and ear canal normal.  Left Ear: Tympanic membrane and ear canal normal.  Mouth/Throat: Oropharynx is clear and moist.  Eyes: Pupils are equal, round, and reactive to light. EOM are normal.  Cardiovascular: Normal rate and regular rhythm.  Respiratory: Effort normal and breath sounds normal.  GI: Soft. Bowel sounds are normal. There is no abdominal tenderness.  Musculoskeletal:        General: Normal range of motion.     Cervical back: Neck supple.  Neurological: She is alert and oriented to person, place, and time. No cranial nerve deficit.  Reflex Scores:      Patellar reflexes are 2+ on the right side and 2+ on the left side. Skin: Skin is warm and dry.  Psychiatric: She has a normal mood and affect.           Assessment & Plan:

## 2019-11-08 NOTE — Patient Instructions (Signed)
Stop by the lab prior to leaving today. I will notify you of your results once received.   Call the breast center to schedule your mammogram.  Start exercising. You should be getting 150 minutes of moderate intensity exercise weekly.  It's important to improve your diet by reducing consumption of fast food, fried food, processed snack foods, sugary drinks. Increase consumption of fresh vegetables and fruits, whole grains, water.  Ensure you are drinking 64 ounces of water daily.  It was a pleasure to see you today!   Preventive Care 40-64 Years Old, Female Preventive care refers to visits with your health care provider and lifestyle choices that can promote health and wellness. This includes:  A yearly physical exam. This may also be called an annual well check.  Regular dental visits and eye exams.  Immunizations.  Screening for certain conditions.  Healthy lifestyle choices, such as eating a healthy diet, getting regular exercise, not using drugs or products that contain nicotine and tobacco, and limiting alcohol use. What can I expect for my preventive care visit? Physical exam Your health care provider will check your:  Height and weight. This may be used to calculate body mass index (BMI), which tells if you are at a healthy weight.  Heart rate and blood pressure.  Skin for abnormal spots. Counseling Your health care provider may ask you questions about your:  Alcohol, tobacco, and drug use.  Emotional well-being.  Home and relationship well-being.  Sexual activity.  Eating habits.  Work and work environment.  Method of birth control.  Menstrual cycle.  Pregnancy history. What immunizations do I need?  Influenza (flu) vaccine  This is recommended every year. Tetanus, diphtheria, and pertussis (Tdap) vaccine  You may need a Td booster every 10 years. Varicella (chickenpox) vaccine  You may need this if you have not been vaccinated. Zoster  (shingles) vaccine  You may need this after age 60. Measles, mumps, and rubella (MMR) vaccine  You may need at least one dose of MMR if you were born in 1957 or later. You may also need a second dose. Pneumococcal conjugate (PCV13) vaccine  You may need this if you have certain conditions and were not previously vaccinated. Pneumococcal polysaccharide (PPSV23) vaccine  You may need one or two doses if you smoke cigarettes or if you have certain conditions. Meningococcal conjugate (MenACWY) vaccine  You may need this if you have certain conditions. Hepatitis A vaccine  You may need this if you have certain conditions or if you travel or work in places where you may be exposed to hepatitis A. Hepatitis B vaccine  You may need this if you have certain conditions or if you travel or work in places where you may be exposed to hepatitis B. Haemophilus influenzae type b (Hib) vaccine  You may need this if you have certain conditions. Human papillomavirus (HPV) vaccine  If recommended by your health care provider, you may need three doses over 6 months. You may receive vaccines as individual doses or as more than one vaccine together in one shot (combination vaccines). Talk with your health care provider about the risks and benefits of combination vaccines. What tests do I need? Blood tests  Lipid and cholesterol levels. These may be checked every 5 years, or more frequently if you are over 50 years old.  Hepatitis C test.  Hepatitis B test. Screening  Lung cancer screening. You may have this screening every year starting at age 55 if you have a   30-pack-year history of smoking and currently smoke or have quit within the past 15 years.  Colorectal cancer screening. All adults should have this screening starting at age 50 and continuing until age 75. Your health care provider may recommend screening at age 45 if you are at increased risk. You will have tests every 1-10 years, depending  on your results and the type of screening test.  Diabetes screening. This is done by checking your blood sugar (glucose) after you have not eaten for a while (fasting). You may have this done every 1-3 years.  Mammogram. This may be done every 1-2 years. Talk with your health care provider about when you should start having regular mammograms. This may depend on whether you have a family history of breast cancer.  BRCA-related cancer screening. This may be done if you have a family history of breast, ovarian, tubal, or peritoneal cancers.  Pelvic exam and Pap test. This may be done every 3 years starting at age 21. Starting at age 30, this may be done every 5 years if you have a Pap test in combination with an HPV test. Other tests  Sexually transmitted disease (STD) testing.  Bone density scan. This is done to screen for osteoporosis. You may have this scan if you are at high risk for osteoporosis. Follow these instructions at home: Eating and drinking  Eat a diet that includes fresh fruits and vegetables, whole grains, lean protein, and low-fat dairy.  Take vitamin and mineral supplements as recommended by your health care provider.  Do not drink alcohol if: ? Your health care provider tells you not to drink. ? You are pregnant, may be pregnant, or are planning to become pregnant.  If you drink alcohol: ? Limit how much you have to 0-1 drink a day. ? Be aware of how much alcohol is in your drink. In the [place] one drink equals one 12 oz bottle of beer (355 mL), one 5 oz glass of wine (148 mL), or one 1 oz glass of hard liquor (44 mL). Lifestyle  Take daily care of your teeth and gums.  Stay active. Exercise for at least 30 minutes on 5 or more days each week.  Do not use any products that contain nicotine or tobacco, such as cigarettes, e-cigarettes, and chewing tobacco. If you need help quitting, ask your health care provider.  If you are sexually active, practice safe sex. Use  a condom or other form of birth control (contraception) in order to prevent pregnancy and STIs (sexually transmitted infections).  If told by your health care provider, take low-dose aspirin daily starting at age 50. What's next?  Visit your health care provider once a year for a well check visit.  Ask your health care provider how often you should have your eyes and teeth checked.  Stay up to date on all vaccines. This information is not intended to replace advice given to you by your health care provider. Make sure you discuss any questions you have with your health care provider. Document Revised: 05/13/2018 Document Reviewed: 05/13/2018 Elsevier Patient Education  2020 Elsevier Inc.  

## 2019-11-08 NOTE — Assessment & Plan Note (Signed)
Overall stable, no recent breakthrough vaginal bleeding. Continue to monitor.

## 2019-11-10 ENCOUNTER — Other Ambulatory Visit: Payer: Self-pay | Admitting: Primary Care

## 2019-11-10 ENCOUNTER — Other Ambulatory Visit (INDEPENDENT_AMBULATORY_CARE_PROVIDER_SITE_OTHER): Payer: BC Managed Care – PPO

## 2019-11-10 DIAGNOSIS — R7989 Other specified abnormal findings of blood chemistry: Secondary | ICD-10-CM

## 2019-11-10 DIAGNOSIS — D509 Iron deficiency anemia, unspecified: Secondary | ICD-10-CM

## 2019-11-10 LAB — IBC PANEL
Iron: 16 ug/dL — ABNORMAL LOW (ref 42–145)
Saturation Ratios: 3.9 % — ABNORMAL LOW (ref 20.0–50.0)
Transferrin: 294 mg/dL (ref 212.0–360.0)

## 2019-11-10 LAB — FERRITIN: Ferritin: 5.4 ng/mL — ABNORMAL LOW (ref 10.0–291.0)

## 2019-11-12 DIAGNOSIS — D509 Iron deficiency anemia, unspecified: Secondary | ICD-10-CM | POA: Insufficient documentation

## 2019-11-12 NOTE — Progress Notes (Signed)
[facility]  [phone] [phone]  ID: [name] OB: [birth year]  MR#: [id number]  XQJ#:[id number]  Patient Care Team: Pleas Koch, NP as PCP - General (Internal Medicine)  CHIEF COMPLAINT: Iron deficiency anemia.  INTERVAL HISTORY: Patient is a 39 year old female who was noted to have a significantly decreased hemoglobin and iron stores routine blood work.  She has a history of fibroids and heavy menstrual bleeding.  She has chronic weakness and fatigue, but otherwise feels well.  She has no neurologic complaints.  She denies any recent fevers or illnesses.  She has a good appetite and denies weight loss.  She has no chest pain, shortness of breath, cough, or hemoptysis.  She denies any nausea, vomiting, constipation, or diarrhea.  She has no melena or hematochezia.  She has no urinary complaints.  Patient offers no further specific complaints today.  REVIEW OF SYSTEMS:   Review of Systems  Constitutional: Positive for malaise/fatigue. Negative for fever and weight loss.  Respiratory: Negative.  Negative for cough and shortness of breath.   Cardiovascular: Negative.  Negative for chest pain and leg swelling.  Gastrointestinal: Negative.  Negative for abdominal pain, blood in stool and melena.  Genitourinary: Negative.  Negative for hematuria.  Musculoskeletal: Negative.  Negative for back pain.  Skin: Negative.  Negative for rash.  Neurological: Negative.  Negative for dizziness, focal weakness, weakness and headaches.  Psychiatric/Behavioral: Negative.  The patient is not nervous/anxious.     As per HPI. Otherwise, a complete review of systems is negative.  PAST MEDICAL HISTORY: Past Medical History:  Diagnosis Date  . Abnormal Pap smear of cervix   . Chickenpox   . UTI (urinary tract infection)     PAST SURGICAL HISTORY: Past Surgical History:  Procedure Laterality Date  . 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 Thyroid disease Mother   . Hypertension Mother   . Cancer Father        leukemia  . Stroke Father   . Cancer Maternal Aunt        Breast Cancer  . Asthma Sister   . Miscarriages / [place] Sister     ADVANCED DIRECTIVES (Y/N):  N  HEALTH MAINTENANCE: Social History   Tobacco Use  . Smoking status: Current Every Day Smoker    Packs/day: 0.50    Years: 10.00    Pack years: 5.00  . Smokeless tobacco: Never Used  . Tobacco comment: Has not tried any quit aids/tn  Substance Use Topics  . Alcohol use: Yes    Alcohol/week: 0.0 standard drinks    Comment: weekends  . Drug use: No     Colonoscopy:  PAP:  Bone density:  Lipid panel:  No Known Allergies  Current Outpatient Medications  Medication Sig Dispense Refill  . EPINEPHrine 0.3 mg/0.3 mL IJ SOAJ injection Inject 0.3 mLs (0.3 mg total) into the muscle as needed for anaphylaxis. 1 each 0   No current facility-administered medications for this visit.    OBJECTIVE: Vitals:   11/15/19 1127  BP: 117/76  Pulse: 78  Resp: 16  Temp: (!) 97.5 F (36.4 C)  SpO2: 100%     Body mass index is 29.17 kg/m.    ECOG FS:0 - Asymptomatic  General: Well-developed, well-nourished, no acute distress. Eyes: Pink conjunctiva, anicteric sclera. HEENT: Normocephalic, moist  mucous membranes. Lungs: No audible wheezing or coughing. Heart: Regular rate and rhythm. Abdomen: Soft, nontender, no obvious distention. Musculoskeletal: No edema, cyanosis, or clubbing. Neuro: Alert, answering all questions appropriately. Cranial nerves grossly intact. Skin: No rashes or petechiae noted. Psych: Normal affect. Lymphatics: No cervical, calvicular, axillary or inguinal LAD.   LAB RESULTS:  Lab Results  Component Value Date   NA 138 11/08/2019   K 3.5 11/08/2019   CL 102 11/08/2019   CO2 29 11/08/2019   GLUCOSE  92 11/08/2019   BUN 7 11/08/2019   CREATININE 0.72 11/08/2019   CALCIUM 8.9 11/08/2019   PROT 7.0 11/08/2019   ALBUMIN 3.6 11/08/2019   AST 13 11/08/2019   ALT 8 11/08/2019   ALKPHOS 56 11/08/2019   BILITOT 0.4 11/08/2019   GFRNONAA >60 04/11/2009   GFRAA  04/11/2009    >60        The eGFR has been calculated using the MDRD equation. This calculation has not been validated in all clinical situations. eGFR's persistently <60 mL/min signify possible Chronic Kidney Disease.    Lab Results  Component Value Date   WBC 7.5 11/08/2019   NEUTROABS 5.0 07/19/2014   HGB 8.4 Repeated and verified X2. (L) 11/08/2019   HCT 28.2 (L) 11/08/2019   MCV 60.3 Repeated and verified X2. (L) 11/08/2019   PLT 499.0 (H) 11/08/2019   Lab Results  Component Value Date   IRON 16 (L) 11/10/2019   IRONPCTSAT 3.9 (L) 11/10/2019   Lab Results  Component Value Date   FERRITIN 5.4 (L) 11/10/2019     STUDIES: No results found.  ASSESSMENT: Iron deficiency anemia.  PLAN:    1. Iron deficiency anemia: Likely secondary to fibroids and heavy menstrual bleeding.  Her hemoglobin iron stores are significantly reduced and she is mildly symptomatic.  Patient will benefit from 510 mg IV Feraheme.  She has been instructed that she does not require oral iron supplementation at this time, but would likely recommend treatment once her iron stores have been repleted.  Return to clinic in 1 and 2 weeks for Feraheme only.  Patient will then return to clinic in 3 months for repeat laboratory work, further evaluation, and continuation of treatment if necessary. 2.  Fibroids: Patient reports she has follow-up with OB/GYN later this month. 3.  Thrombocytosis: Likely secondary to iron deficiency.  IV iron as above.  I spent a total of 45 minutes reviewing chart data, face-to-face evaluation with the patient, counseling and coordination of care as detailed above.   Patient expressed understanding and was in  agreement with this plan. She also understands that She can call clinic at any time with any questions, concerns, or complaints.    Lloyd Huger, MD   11/15/2019 11:58 AM

## 2019-11-14 ENCOUNTER — Encounter: Payer: Self-pay | Admitting: Oncology

## 2019-11-14 ENCOUNTER — Other Ambulatory Visit: Payer: Self-pay

## 2019-11-14 NOTE — Progress Notes (Signed)
Patient pre screened for office appointment, no questions or concerns today. Patient reminded of upcoming appointment time and date. 

## 2019-11-15 ENCOUNTER — Other Ambulatory Visit: Payer: Self-pay

## 2019-11-15 ENCOUNTER — Inpatient Hospital Stay: Payer: BC Managed Care – PPO | Attending: Oncology | Admitting: Oncology

## 2019-11-15 DIAGNOSIS — D473 Essential (hemorrhagic) thrombocythemia: Secondary | ICD-10-CM | POA: Insufficient documentation

## 2019-11-15 DIAGNOSIS — D259 Leiomyoma of uterus, unspecified: Secondary | ICD-10-CM

## 2019-11-15 DIAGNOSIS — Z806 Family history of leukemia: Secondary | ICD-10-CM | POA: Diagnosis not present

## 2019-11-15 DIAGNOSIS — N920 Excessive and frequent menstruation with regular cycle: Secondary | ICD-10-CM | POA: Diagnosis not present

## 2019-11-15 DIAGNOSIS — R531 Weakness: Secondary | ICD-10-CM | POA: Insufficient documentation

## 2019-11-15 DIAGNOSIS — D509 Iron deficiency anemia, unspecified: Secondary | ICD-10-CM | POA: Insufficient documentation

## 2019-11-15 DIAGNOSIS — Z803 Family history of malignant neoplasm of breast: Secondary | ICD-10-CM | POA: Insufficient documentation

## 2019-11-15 DIAGNOSIS — R5383 Other fatigue: Secondary | ICD-10-CM | POA: Diagnosis not present

## 2019-11-15 DIAGNOSIS — F17210 Nicotine dependence, cigarettes, uncomplicated: Secondary | ICD-10-CM

## 2019-11-23 ENCOUNTER — Other Ambulatory Visit: Payer: Self-pay

## 2019-11-23 ENCOUNTER — Inpatient Hospital Stay: Payer: BC Managed Care – PPO

## 2019-11-23 VITALS — BP 120/78 | HR 77 | Temp 97.8°F | Resp 18

## 2019-11-23 DIAGNOSIS — F17210 Nicotine dependence, cigarettes, uncomplicated: Secondary | ICD-10-CM | POA: Diagnosis not present

## 2019-11-23 DIAGNOSIS — Z803 Family history of malignant neoplasm of breast: Secondary | ICD-10-CM | POA: Diagnosis not present

## 2019-11-23 DIAGNOSIS — D509 Iron deficiency anemia, unspecified: Secondary | ICD-10-CM | POA: Diagnosis not present

## 2019-11-23 DIAGNOSIS — D259 Leiomyoma of uterus, unspecified: Secondary | ICD-10-CM | POA: Diagnosis not present

## 2019-11-23 DIAGNOSIS — D473 Essential (hemorrhagic) thrombocythemia: Secondary | ICD-10-CM | POA: Diagnosis not present

## 2019-11-23 DIAGNOSIS — Z806 Family history of leukemia: Secondary | ICD-10-CM | POA: Diagnosis not present

## 2019-11-23 DIAGNOSIS — R5383 Other fatigue: Secondary | ICD-10-CM | POA: Diagnosis not present

## 2019-11-23 DIAGNOSIS — R531 Weakness: Secondary | ICD-10-CM | POA: Diagnosis not present

## 2019-11-23 DIAGNOSIS — N920 Excessive and frequent menstruation with regular cycle: Secondary | ICD-10-CM | POA: Diagnosis not present

## 2019-11-23 MED ORDER — SODIUM CHLORIDE 0.9 % IV SOLN
Freq: Once | INTRAVENOUS | Status: AC
  Filled 2019-11-23: qty 250

## 2019-11-23 MED ORDER — SODIUM CHLORIDE 0.9 % IV SOLN
510.0000 mg | Freq: Once | INTRAVENOUS | Status: AC
  Administered 2019-11-23: 510 mg via INTRAVENOUS
  Filled 2019-11-23: qty 17

## 2019-11-30 ENCOUNTER — Inpatient Hospital Stay: Payer: BC Managed Care – PPO

## 2019-11-30 ENCOUNTER — Other Ambulatory Visit: Payer: Self-pay

## 2019-11-30 VITALS — BP 107/70 | HR 97 | Temp 97.1°F | Resp 20

## 2019-11-30 DIAGNOSIS — Z806 Family history of leukemia: Secondary | ICD-10-CM | POA: Diagnosis not present

## 2019-11-30 DIAGNOSIS — R5383 Other fatigue: Secondary | ICD-10-CM | POA: Diagnosis not present

## 2019-11-30 DIAGNOSIS — D473 Essential (hemorrhagic) thrombocythemia: Secondary | ICD-10-CM | POA: Diagnosis not present

## 2019-11-30 DIAGNOSIS — D509 Iron deficiency anemia, unspecified: Secondary | ICD-10-CM | POA: Diagnosis not present

## 2019-11-30 DIAGNOSIS — Z803 Family history of malignant neoplasm of breast: Secondary | ICD-10-CM | POA: Diagnosis not present

## 2019-11-30 DIAGNOSIS — R531 Weakness: Secondary | ICD-10-CM | POA: Diagnosis not present

## 2019-11-30 DIAGNOSIS — F17210 Nicotine dependence, cigarettes, uncomplicated: Secondary | ICD-10-CM | POA: Diagnosis not present

## 2019-11-30 DIAGNOSIS — D259 Leiomyoma of uterus, unspecified: Secondary | ICD-10-CM | POA: Diagnosis not present

## 2019-11-30 DIAGNOSIS — N920 Excessive and frequent menstruation with regular cycle: Secondary | ICD-10-CM | POA: Diagnosis not present

## 2019-11-30 MED ORDER — SODIUM CHLORIDE 0.9 % IV SOLN
Freq: Once | INTRAVENOUS | Status: AC
  Filled 2019-11-30: qty 250

## 2019-11-30 MED ORDER — SODIUM CHLORIDE 0.9 % IV SOLN
510.0000 mg | Freq: Once | INTRAVENOUS | Status: AC
  Administered 2019-11-30: 510 mg via INTRAVENOUS
  Filled 2019-11-30: qty 510

## 2019-12-06 ENCOUNTER — Encounter: Payer: Self-pay | Admitting: Obstetrics & Gynecology

## 2019-12-06 ENCOUNTER — Other Ambulatory Visit (HOSPITAL_COMMUNITY)
Admission: RE | Admit: 2019-12-06 | Discharge: 2019-12-06 | Disposition: A | Discharge status: Home / Self Care | Payer: BC Managed Care – PPO | Source: Ambulatory Visit | Attending: Obstetrics & Gynecology | Admitting: Obstetrics & Gynecology

## 2019-12-06 ENCOUNTER — Ambulatory Visit (INDEPENDENT_AMBULATORY_CARE_PROVIDER_SITE_OTHER): Payer: BC Managed Care – PPO | Admitting: Obstetrics & Gynecology

## 2019-12-06 ENCOUNTER — Other Ambulatory Visit: Payer: Self-pay

## 2019-12-06 VITALS — BP 109/74 | HR 74 | Wt 154.4 lb

## 2019-12-06 DIAGNOSIS — N760 Acute vaginitis: Secondary | ICD-10-CM

## 2019-12-06 DIAGNOSIS — Z01419 Encounter for gynecological examination (general) (routine) without abnormal findings: Secondary | ICD-10-CM | POA: Diagnosis not present

## 2019-12-06 DIAGNOSIS — Z1231 Encounter for screening mammogram for malignant neoplasm of breast: Secondary | ICD-10-CM | POA: Diagnosis not present

## 2019-12-06 DIAGNOSIS — B9689 Other specified bacterial agents as the cause of diseases classified elsewhere: Secondary | ICD-10-CM

## 2019-12-06 NOTE — Patient Instructions (Signed)

## 2019-12-06 NOTE — Progress Notes (Signed)
GYNECOLOGY ANNUAL PREVENTATIVE CARE ENCOUNTER NOTE  History:     Virginia Bird is a 39 y.o. G12P1011 female here for a routine annual gynecologic exam.  Current complaints: none.  Recently diagnosed with anemia, has history of fibroids but adamantly denies heavy menstrual bleeding.  Periods were usually 6-8 days long, and have been 5 days in length in the last few months and lighter.  No pelvic pain.    Denies any abnormal vaginal bleeding, discharge, pelvic pain, problems with intercourse or other gynecologic concerns.    Gynecologic History No LMP recorded. Contraception: condoms Last Pap: 09/09/2018. Results were: normal with negative HPV  Obstetric History OB History  Gravida Para Term Preterm AB Living  2 1 1  0 1 1  SAB TAB Ectopic Multiple Live Births  0 0 0 0 1    # Outcome Date GA Lbr Len/2nd Weight Sex Delivery Anes PTL Lv  2 Term 2003 [redacted]w[redacted]d   F Vag-Spont  N LIV  1 AB             Past Medical History:  Diagnosis Date  . Abnormal Pap smear of cervix   . Chickenpox   . UTI (urinary tract infection)     Past Surgical History:  Procedure Laterality Date  . 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 EPINEPHrine 0.3 mg/0.3 mL IJ SOAJ injection Inject 0.3 mLs (0.3 mg total) into the muscle as needed for anaphylaxis. 1 each 0   No current facility-administered medications on file prior to visit.    No Known Allergies  Social History:  reports that she has been smoking. She has a 5.00 pack-year smoking history. She has never used smokeless tobacco. She reports current alcohol use. She reports that she does not use drugs.  Family History  Problem Relation Age of Onset  . Thyroid disease Mother   . Hypertension Mother   . Cancer Father        leukemia  . Stroke Father   . Cancer Maternal Aunt        Breast  Cancer  . Asthma Sister   . Miscarriages / [place] Sister     The following portions of the patient's history were reviewed and updated as appropriate: allergies, current medications, past family history, past medical history, past social history, past surgical history and problem list.  Review of Systems Pertinent items noted in HPI and remainder of comprehensive ROS otherwise negative.  Physical Exam:  BP 109/74   Pulse 74   Wt 154 lb 6.4 oz (70 kg)   BMI 29.17 kg/m  CONSTITUTIONAL: Well-developed, well-nourished female in no acute distress.  HENT:  Normocephalic, atraumatic, External right and left ear normal. Oropharynx is clear and moist EYES: Conjunctivae and EOM are normal. Pupils are equal, round, and reactive to light. No scleral icterus.  NECK: Normal range of motion, supple, no masses.  Normal thyroid.  SKIN: Skin is warm and dry. No rash noted. Not diaphoretic. No erythema. No pallor. MUSCULOSKELETAL: Normal range of motion. No tenderness.  No cyanosis, clubbing, or edema.  2+ distal pulses. NEUROLOGIC: Alert and oriented to person, place, and time. Normal reflexes, muscle tone coordination.  PSYCHIATRIC: Normal mood and affect. Normal behavior. Normal judgment and thought content. CARDIOVASCULAR: Normal heart rate noted, regular rhythm RESPIRATORY: Clear to auscultation bilaterally. Effort  and breath sounds normal, no problems with respiration noted. BREASTS: Symmetric in size. No masses, tenderness, skin changes, nipple drainage, or lymphadenopathy bilaterally. Performed in the presence of a chaperone. ABDOMEN: Soft, no distention noted.  No tenderness, rebound or guarding.  PELVIC: Normal appearing external genitalia and urethral meatus; normal appearing vaginal mucosa and cervix.  No abnormal discharge noted.  Pap smear obtained.  Enlarged fibroid uterus, no other palpable masses, no uterine or adnexal tenderness.  Performed in the presence of a chaperone.    Assessment and Plan:      1. Breast cancer screening by mammogram Mammogram scheduled - MM 3D SCREEN BREAST BILATERAL; Future  2. Well woman exam with routine gynecological exam - HIV Antibody (routine testing w rflx) - RPR - Hepatitis C Antibody - Cytology - PAP( [place]) - Cervicovaginal ancillary only( [place]) Will follow up results of pap smear and results and manage accordingly. Routine preventative health maintenance measures emphasized. No surgery indicated currently for her fibroids; told to call if bleeding is heavy or for pain.  She declined surveillance ultrasound scheduling. Please refer to After Visit Summary for other counseling recommendations.      Verita Schneiders, MD, [place], [place]

## 2019-12-07 LAB — CERVICOVAGINAL ANCILLARY ONLY
Bacterial Vaginitis (gardnerella): POSITIVE — AB
Candida Glabrata: NEGATIVE
Candida Vaginitis: NEGATIVE
Chlamydia: NEGATIVE
Comment: NEGATIVE
Comment: NEGATIVE
Comment: NEGATIVE
Comment: NEGATIVE
Comment: NEGATIVE
Comment: NORMAL
Neisseria Gonorrhea: NEGATIVE
Trichomonas: NEGATIVE

## 2019-12-07 LAB — CYTOLOGY - PAP
Comment: NEGATIVE
Diagnosis: NEGATIVE
High risk HPV: NEGATIVE

## 2019-12-07 MED ORDER — METRONIDAZOLE 500 MG PO TABS: 500.0000 mg | ORAL_TABLET | Freq: Two times a day (BID) | ORAL | 0 refills | Status: DC

## 2019-12-07 NOTE — Addendum Note (Signed)
Addended by: Verita Schneiders A on: 12/07/2019 05:38 PM   Modules accepted: Orders

## 2019-12-09 LAB — HIV ANTIBODY (ROUTINE TESTING W REFLEX): HIV Screen 4th Generation wRfx: NONREACTIVE

## 2019-12-09 LAB — HEPATITIS C ANTIBODY: Hep C Virus Ab: <0.1 s/co ratio (ref 0.0–0.9)

## 2019-12-09 LAB — RPR: RPR Ser Ql: NONREACTIVE

## 2019-12-15 ENCOUNTER — Ambulatory Visit
Admission: RE | Admit: 2019-12-15 | Discharge: 2019-12-15 | Disposition: A | Discharge status: Home / Self Care | Payer: BC Managed Care – PPO | Source: Ambulatory Visit | Attending: Obstetrics & Gynecology | Admitting: Obstetrics & Gynecology

## 2019-12-15 DIAGNOSIS — Z1231 Encounter for screening mammogram for malignant neoplasm of breast: Secondary | ICD-10-CM | POA: Diagnosis not present

## 2020-01-13 ENCOUNTER — Telehealth: Payer: BC Managed Care – PPO | Admitting: Nurse Practitioner

## 2020-01-13 DIAGNOSIS — J0100 Acute maxillary sinusitis, unspecified: Secondary | ICD-10-CM

## 2020-01-13 MED ORDER — AMOXICILLIN-POT CLAVULANATE 875-125 MG PO TABS: 1.0000 | ORAL_TABLET | Freq: Two times a day (BID) | ORAL | 0 refills | Status: DC

## 2020-01-13 NOTE — Progress Notes (Signed)

## 2020-02-10 NOTE — Progress Notes (Signed)
Virginia Bird  [phone] [phone]) [phone]  ID: Virginia Bird OB: [birth year]  MR#: [id number]  AOZ#:[id number]  Patient Care Team: Pleas Koch, NP as PCP - General (Internal Medicine)  CHIEF COMPLAINT: Iron deficiency anemia.  INTERVAL HISTORY: Patient returns to clinic today for repeat laboratory work, further evaluation, and consideration of additional IV Feraheme.  She continues to have weakness and fatigue, but states this has improved since receiving treatment 3 months ago.  She has a history of fibroids and heavy menstrual bleeding. She has no neurologic complaints.  She denies any recent fevers or illnesses.  She has a good appetite and denies weight loss.  She has no chest pain, shortness of breath, cough, or hemoptysis.  She denies any nausea, vomiting, constipation, or diarrhea.  She has no melena or hematochezia.  She has no urinary complaints.  Patient offers no further specific complaints today.  REVIEW OF SYSTEMS:   Review of Systems  Constitutional: Positive for malaise/fatigue. Negative for fever and weight loss.  Respiratory: Negative.  Negative for cough and shortness of breath.   Cardiovascular: Negative.  Negative for chest pain and leg swelling.  Gastrointestinal: Negative.  Negative for abdominal pain, blood in stool and melena.  Genitourinary: Negative.  Negative for hematuria.  Musculoskeletal: Negative.  Negative for back pain.  Skin: Negative.  Negative for rash.  Neurological: Positive for weakness. Negative for dizziness, focal weakness and headaches.  Psychiatric/Behavioral: Negative.  The patient is not nervous/anxious.     As per HPI. Otherwise, a complete review of systems is negative.  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    FAMILY HISTORY: Family History  Problem Relation Age of Onset   Thyroid disease Mother    Hypertension Mother    Cancer Father        leukemia   Stroke Father    Cancer Maternal Aunt        Breast Cancer   Breast cancer Maternal Aunt    Asthma Sister    Miscarriages / Stillbirths Sister     ADVANCED DIRECTIVES (Y/N):  N  HEALTH MAINTENANCE: Social History   Tobacco Use   Smoking status: Current Every Day Smoker    Packs/day: 0.50    Years: 10.00    Pack years: 5.00   Smokeless tobacco: Never Used   Tobacco comment: Has not tried any quit aids/tn  Substance Use Topics   Alcohol use: Yes    Alcohol/week: 0.0 standard drinks    Comment: weekends   Drug use: No     Colonoscopy:  PAP:  Bone density:  Lipid panel:  No Known Allergies  Current Outpatient Medications  Medication Sig Dispense Refill   amoxicillin-clavulanate (AUGMENTIN) 875-125 MG tablet Take 1 tablet by mouth 2 (two) times daily. (Patient not taking: Reported on 02/15/2020) 14 tablet 0   EPINEPHrine 0.3 mg/0.3 mL IJ SOAJ injection Inject 0.3 mLs (0.3 mg total) into the muscle as needed for anaphylaxis. (Patient not taking: Reported on 02/15/2020) 1 each 0   metroNIDAZOLE (FLAGYL) 500 MG tablet Take 1 tablet (500 mg total) by mouth 2 (two) times daily. (Patient not taking: Reported on 02/15/2020) 14 tablet  0   No current facility-administered medications for this visit.    OBJECTIVE: Vitals:   02/16/20 1409  BP: 127/86  Pulse: 94  Temp: 98.4 F (36.9 C)  SpO2: 100%     Body mass index is 29.38 kg/m.    ECOG FS:0 - Asymptomatic  General: Well-developed, well-nourished, no acute distress. Eyes: Pink conjunctiva, anicteric sclera. HEENT: Normocephalic, moist mucous membranes. Lungs: No audible wheezing or coughing. Heart: Regular rate and rhythm. Abdomen: Soft, nontender, no obvious  distention. Musculoskeletal: No edema, cyanosis, or clubbing. Neuro: Alert, answering all questions appropriately. Cranial nerves grossly intact. Skin: No rashes or petechiae noted. Psych: Normal affect.  LAB RESULTS:  Lab Results  Component Value Date   NA 138 11/08/2019   K 3.5 11/08/2019   CL 102 11/08/2019   CO2 29 11/08/2019   GLUCOSE 92 11/08/2019   BUN 7 11/08/2019   CREATININE 0.72 11/08/2019   CALCIUM 8.9 11/08/2019   PROT 7.0 11/08/2019   ALBUMIN 3.6 11/08/2019   AST 13 11/08/2019   ALT 8 11/08/2019   ALKPHOS 56 11/08/2019   BILITOT 0.4 11/08/2019   GFRNONAA >60 04/11/2009   GFRAA  04/11/2009    >60        The eGFR has been calculated using the MDRD equation. This calculation has not been validated in all clinical situations. eGFR's persistently <60 mL/min signify possible Chronic Kidney Disease.    Lab Results  Component Value Date   WBC 7.9 02/15/2020   NEUTROABS 5.2 02/15/2020   HGB 11.3 (L) 02/15/2020   HCT 34.7 (L) 02/15/2020   MCV 85.0 02/15/2020   PLT 349 02/15/2020   Lab Results  Component Value Date   IRON 29 02/15/2020   TIBC 284 02/15/2020   IRONPCTSAT 10 (L) 02/15/2020   Lab Results  Component Value Date   FERRITIN 29 02/15/2020     STUDIES: No results found.  ASSESSMENT: Iron deficiency anemia.  PLAN:    1. Iron deficiency anemia: Likely secondary to fibroids and heavy menstrual bleeding.  Patient's hemoglobin and iron stores have significantly improved, but still mildly decreased.  She also remains mildly symptomatic.  Will proceed with 1 additional infusion of 510 mg IV Feraheme today.  Return to clinic in 3 months with repeat laboratory work and video assisted telemedicine visit.   2.  Fibroids: Continue follow-up with OB/GYN as scheduled. 3.  Thrombocytosis: Resolved.  I spent a total of 30 minutes reviewing chart data, face-to-face evaluation with the patient, counseling and coordination of care as detailed  above.   Patient expressed understanding and was in agreement with this plan. She also understands that She can call clinic at any time with any questions, concerns, or complaints.    Lloyd Huger, MD   02/17/2020 6:40 AM

## 2020-02-15 ENCOUNTER — Inpatient Hospital Stay: Payer: BC Managed Care – PPO | Attending: Oncology

## 2020-02-15 ENCOUNTER — Other Ambulatory Visit: Payer: Self-pay

## 2020-02-15 DIAGNOSIS — D509 Iron deficiency anemia, unspecified: Secondary | ICD-10-CM | POA: Insufficient documentation

## 2020-02-15 LAB — CBC WITH DIFFERENTIAL/PLATELET
Abs Immature Granulocytes: 0.03 10*3/uL (ref 0.00–0.07)
Basophils Absolute: 0 10*3/uL (ref 0.0–0.1)
Basophils Relative: 0 %
Eosinophils Absolute: 0.4 10*3/uL (ref 0.0–0.5)
Eosinophils Relative: 5 %
HCT: 34.7 % — ABNORMAL LOW (ref 36.0–46.0)
Hemoglobin: 11.3 g/dL — ABNORMAL LOW (ref 12.0–15.0)
Immature Granulocytes: 0 %
Lymphocytes Relative: 24 %
Lymphs Abs: 1.9 10*3/uL (ref 0.7–4.0)
MCH: 27.7 pg (ref 26.0–34.0)
MCHC: 32.6 g/dL (ref 30.0–36.0)
MCV: 85 fL (ref 80.0–100.0)
Monocytes Absolute: 0.4 10*3/uL (ref 0.1–1.0)
Monocytes Relative: 5 %
Neutro Abs: 5.2 10*3/uL (ref 1.7–7.7)
Neutrophils Relative %: 66 %
Platelets: 349 10*3/uL (ref 150–400)
RBC: 4.08 MIL/uL (ref 3.87–5.11)
RDW: 17 % — ABNORMAL HIGH (ref 11.5–15.5)
WBC: 7.9 10*3/uL (ref 4.0–10.5)
nRBC: 0 % (ref 0.0–0.2)

## 2020-02-15 LAB — IRON AND TIBC
Iron: 29 ug/dL (ref 28–170)
Saturation Ratios: 10 % — ABNORMAL LOW (ref 10.4–31.8)
TIBC: 284 ug/dL (ref 250–450)
UIBC: 255 ug/dL

## 2020-02-15 LAB — FERRITIN: Ferritin: 29 ng/mL (ref 11–307)

## 2020-02-15 NOTE — Progress Notes (Signed)
CALLED PATIENT TO GO OVER PRE SCREENING QUESTIONS. PATIENT DENIED ANY PAIN.

## 2020-02-16 ENCOUNTER — Inpatient Hospital Stay: Payer: BC Managed Care – PPO

## 2020-02-16 ENCOUNTER — Inpatient Hospital Stay (HOSPITAL_BASED_OUTPATIENT_CLINIC_OR_DEPARTMENT_OTHER): Payer: BC Managed Care – PPO | Admitting: Oncology

## 2020-02-16 VITALS — BP 127/86 | HR 94 | Temp 98.4°F | Wt 155.5 lb

## 2020-02-16 DIAGNOSIS — D509 Iron deficiency anemia, unspecified: Secondary | ICD-10-CM | POA: Diagnosis not present

## 2020-02-16 MED ORDER — SODIUM CHLORIDE 0.9 % IV SOLN
510.0000 mg | Freq: Once | INTRAVENOUS | Status: AC
  Administered 2020-02-16: 510 mg via INTRAVENOUS
  Filled 2020-02-16: qty 510

## 2020-02-16 MED ORDER — SODIUM CHLORIDE 0.9 % IV SOLN
Freq: Once | INTRAVENOUS | Status: AC
  Filled 2020-02-16: qty 250

## 2020-03-16 ENCOUNTER — Ambulatory Visit: Payer: BC Managed Care – PPO | Admitting: Internal Medicine

## 2020-03-16 ENCOUNTER — Encounter: Payer: Self-pay | Admitting: Internal Medicine

## 2020-03-16 ENCOUNTER — Other Ambulatory Visit: Payer: Self-pay

## 2020-03-16 VITALS — BP 122/84 | HR 96 | Temp 97.7°F | Wt 154.0 lb

## 2020-03-16 DIAGNOSIS — R350 Frequency of micturition: Secondary | ICD-10-CM

## 2020-03-16 DIAGNOSIS — R102 Pelvic and perineal pain: Secondary | ICD-10-CM

## 2020-03-16 DIAGNOSIS — N898 Other specified noninflammatory disorders of vagina: Secondary | ICD-10-CM

## 2020-03-16 LAB — POC URINALSYSI DIPSTICK (AUTOMATED)
Bilirubin, UA: NEGATIVE
Glucose, UA: NEGATIVE
Nitrite, UA: NEGATIVE
Protein, UA: POSITIVE — AB
Spec Grav, UA: 1.015 (ref 1.010–1.025)
Urobilinogen, UA: 0.2 E.U./dL
pH, UA: 8 (ref 5.0–8.0)

## 2020-03-16 MED ORDER — FLUCONAZOLE 150 MG PO TABS: 150.0000 mg | ORAL_TABLET | Freq: Once | ORAL | 0 refills | Status: AC

## 2020-03-16 MED ORDER — NITROFURANTOIN MONOHYD MACRO 100 MG PO CAPS
100.0000 mg | ORAL_CAPSULE | Freq: Two times a day (BID) | ORAL | 0 refills | Status: DC
Start: 2020-03-16 — End: 2020-05-01

## 2020-03-16 NOTE — Progress Notes (Signed)
Subjective:    Patient ID: Virginia Bird, female    DOB: 01-Dec-1980, 39 y.o.   MRN: [id number]  HPI  Pt presents to the clinic today with c/o pelvic pain, urinary frequency and vaginal itching. This started 4-5 days ago. She describes the pain as sore and achy. The pain radiates into her groins. She denies urgency, dysuria, blood in her urine, vaginal discharge, odor or abnormal bleeding. She denies fever, chills, nausea or low back pain. She has tried Vagisil OTC with some relief. She is sexually active, no particular concerns about STD but will get tested today.  Review of Systems  Past Medical History:  Diagnosis Date  . Abnormal Pap smear of cervix   . Chickenpox   . UTI (urinary tract infection)     Current Outpatient Medications  Medication Sig Dispense Refill  . amoxicillin-clavulanate (AUGMENTIN) 875-125 MG tablet Take 1 tablet by mouth 2 (two) times daily. (Patient not taking: Reported on 02/15/2020) 14 tablet 0  . EPINEPHrine 0.3 mg/0.3 mL IJ SOAJ injection Inject 0.3 mLs (0.3 mg total) into the muscle as needed for anaphylaxis. (Patient not taking: Reported on 02/15/2020) 1 each 0  . metroNIDAZOLE (FLAGYL) 500 MG tablet Take 1 tablet (500 mg total) by mouth 2 (two) times daily. (Patient not taking: Reported on 02/15/2020) 14 tablet 0   No current facility-administered medications for this visit.    No Known Allergies  Family History  Problem Relation Age of Onset  . Thyroid disease Mother   . Hypertension Mother   . Cancer Father        leukemia  . Stroke Father   . Cancer Maternal Aunt        Breast Cancer  . Breast cancer Maternal Aunt   . Asthma Sister   . Miscarriages / [place] Sister     Social History   Socioeconomic History  . Marital status: Single    Spouse name: Not on file  . Number of children: Not on file  . Years of education: Not on file  . Highest education level: Not on file  Occupational History  . Not on file  Tobacco Use  .  Smoking status: Current Every Day Smoker    Packs/day: 0.50    Years: 10.00    Pack years: 5.00  . Smokeless tobacco: Never Used  . Tobacco comment: Has not tried any quit aids/tn  Substance and Sexual Activity  . Alcohol use: Yes    Alcohol/week: 0.0 standard drinks    Comment: weekends  . Drug use: No  . Sexual activity: Yes    Partners: Male  Other Topics Concern  . Not on file  Social History Narrative   Single.    1 daughter.    Works at [employer].   Enjoys going to [employer].    Social Determinants of Health   Financial Resource Strain:   . Difficulty of Paying Living Expenses:   Food Insecurity:   . Worried About Charity fundraiser in the Last Year:   . Arboriculturist in the Last Year:   Transportation Needs:   . Film/video editor (Medical):   Marland Kitchen Lack of Transportation (Non-Medical):   Physical Activity:   . Days of Exercise per Week:   . Minutes of Exercise per Session:   Stress:   . Feeling of Stress :   Social Connections:   . Frequency of Communication with Friends and Family:   . Frequency of Social Gatherings  with Friends and Family:   . Attends Religious Services:   . Active Member of Clubs or Organizations:   . Attends Archivist Meetings:   Marland Kitchen Marital Status:   Intimate Partner Violence:   . Fear of Current or Ex-Partner:   . Emotionally Abused:   Marland Kitchen Physically Abused:   . Sexually Abused:      Constitutional: Denies fever, malaise, fatigue, headache or abrupt weight changes.  Respiratory: Denies difficulty breathing, shortness of breath, cough or sputum production.   Cardiovascular: Denies chest pain, chest tightness, palpitations or swelling in the hands or feet.  Gastrointestinal: Pt reports pelvic pain. Denies abdominal pain, bloating, constipation, diarrhea or blood in the stool.  GU: Pt reports urinary frequency, vaginal itching. Denies urgency, pain with urination, burning sensation, blood in urine, odor or  discharge.  No other specific complaints in a complete review of systems (except as listed in HPI above).     Objective:   Physical Exam  BP 122/84   Pulse 96   Temp 97.7 F (36.5 C) (Temporal)   Wt 154 lb (69.9 kg)   LMP 03/04/2020   SpO2 98%   BMI 29.10 kg/m   Wt Readings from Last 3 Encounters:  02/16/20 155 lb 8 oz (70.5 kg)  12/06/19 154 lb 6.4 oz (70 kg)  11/15/19 154 lb 6.4 oz (70 kg)    General: Appears her stated age, well developed, well nourished in NAD. Cardiovascular: Normal rate and rhythm. S1,S2 noted.  No murmur, rubs or gallops noted.  Pulmonary/Chest: Normal effort and positive vesicular breath sounds. No respiratory distress. No wheezes, rales or ronchi noted.  Abdomen: Soft and mildly tender in the suprapubic area. Normal bowel sounds. No distention or masses noted. No CVA tenderness. Pelvic: Normal female anatomy. Vaginal vault erythematous with thick white discharge noted. Cervix without lesion or mass.  Neurological: Alert and oriented.    BMET    Component Value Date/Time   NA 138 11/08/2019 0946   K 3.5 11/08/2019 0946   CL 102 11/08/2019 0946   CO2 29 11/08/2019 0946   GLUCOSE 92 11/08/2019 0946   BUN 7 11/08/2019 0946   CREATININE 0.72 11/08/2019 0946   CREATININE 0.81 07/19/2014 0923   CALCIUM 8.9 11/08/2019 0946   GFRNONAA >60 04/11/2009 2235   GFRAA  04/11/2009 2235    >60        The eGFR has been calculated using the MDRD equation. This calculation has not been validated in all clinical situations. eGFR's persistently <60 mL/min signify possible Chronic Kidney Disease.    Lipid Panel     Component Value Date/Time   CHOL 117 11/08/2019 0946   TRIG 119.0 11/08/2019 0946   HDL 48.60 11/08/2019 0946   CHOLHDL 2 11/08/2019 0946   VLDL 23.8 11/08/2019 0946   LDLCALC 44 11/08/2019 0946    CBC    Component Value Date/Time   WBC 7.9 02/15/2020 1407   RBC 4.08 02/15/2020 1407   HGB 11.3 (L) 02/15/2020 1407   HCT 34.7 (L)  02/15/2020 1407   PLT 349 02/15/2020 1407   MCV 85.0 02/15/2020 1407   MCH 27.7 02/15/2020 1407   MCHC 32.6 02/15/2020 1407   RDW 17.0 (H) 02/15/2020 1407   LYMPHSABS 1.9 02/15/2020 1407   MONOABS 0.4 02/15/2020 1407   EOSABS 0.4 02/15/2020 1407   BASOSABS 0.0 02/15/2020 1407    Hgb A1C Lab Results  Component Value Date   HGBA1C 5.9 05/28/2018  Assessment & Plan:   Pelvic Pain, Urinary Frequency, Vaginal Itching:  Urinalysis: 1+ blood, 1+ leuks Will send urine culture RX for Macrobid 100 mg BID x 5 days RX for Diflucan 150 mg PO x 1 for possible yeast Will send off wet prep Will check gonorrhea, chlamydia  Will follow up after labs, return precautions discussed  Webb Silversmith, NP This visit occurred during the SARS-CoV-2 public health emergency.  Safety protocols were in place, including screening questions prior to the visit, additional usage of staff PPE, and extensive cleaning of exam room while observing appropriate contact time as indicated for disinfecting solutions.

## 2020-03-16 NOTE — Patient Instructions (Signed)

## 2020-03-16 NOTE — Addendum Note (Signed)
Addended by: Ellamae Sia on: 03/16/2020 04:29 PM   Modules accepted: Orders

## 2020-03-17 LAB — WET PREP BY MOLECULAR PROBE
Candida species: DETECTED — AB
Gardnerella vaginalis: NOT DETECTED
MICRO NUMBER:: 10663295
SPECIMEN QUALITY:: ADEQUATE
Trichomonas vaginosis: NOT DETECTED

## 2020-03-17 LAB — URINE CULTURE
MICRO NUMBER:: 10663296
SPECIMEN QUALITY:: ADEQUATE

## 2020-03-17 LAB — C. TRACHOMATIS/N. GONORRHOEAE RNA
C. trachomatis RNA, TMA: NOT DETECTED
N. gonorrhoeae RNA, TMA: NOT DETECTED

## 2020-04-17 DIAGNOSIS — Z03818 Encounter for observation for suspected exposure to other biological agents ruled out: Secondary | ICD-10-CM | POA: Diagnosis not present

## 2020-04-17 DIAGNOSIS — Z20822 Contact with and (suspected) exposure to covid-19: Secondary | ICD-10-CM | POA: Diagnosis not present

## 2020-04-26 ENCOUNTER — Telehealth: Payer: BC Managed Care – PPO | Admitting: Emergency Medicine

## 2020-04-26 DIAGNOSIS — J0191 Acute recurrent sinusitis, unspecified: Secondary | ICD-10-CM | POA: Diagnosis not present

## 2020-04-26 MED ORDER — AMOXICILLIN-POT CLAVULANATE 875-125 MG PO TABS: 1.0000 | ORAL_TABLET | Freq: Two times a day (BID) | ORAL | 0 refills | Status: DC

## 2020-04-26 NOTE — Progress Notes (Signed)
We are sorry that you are not feeling well.  Here is how we plan to help!  Based on what you have shared with me it looks like you have sinusitis.  Sinusitis is inflammation and infection in the sinus cavities of the head.  Based on your presentation I believe you most likely have Acute Bacterial Sinusitis.  This is an infection caused by bacteria and is treated with antibiotics. I have prescribed Augmentin 875mg/125mg one tablet twice daily with food, for 7 days. You may use an oral decongestant such as Mucinex D or if you have glaucoma or high blood pressure use plain Mucinex. Saline nasal spray help and can safely be used as often as needed for congestion.  If you develop worsening sinus pain, fever or notice severe headache and vision changes, or if symptoms are not better after completion of antibiotic, please schedule an appointment with a health care provider.    Sinus infections are not as easily transmitted as other respiratory infection, however we still recommend that you avoid close contact with loved ones, especially the very young and elderly.  Remember to wash your hands thoroughly throughout the day as this is the number one way to prevent the spread of infection!  Home Care:  Only take medications as instructed by your medical team.  Complete the entire course of an antibiotic.  Do not take these medications with alcohol.  A steam or ultrasonic humidifier can help congestion.  You can place a towel over your head and breathe in the steam from hot water coming from a faucet.  Avoid close contacts especially the very young and the elderly.  Cover your mouth when you cough or sneeze.  Always remember to wash your hands.  Get Help Right Away If:  You develop worsening fever or sinus pain.  You develop a severe head ache or visual changes.  Your symptoms persist after you have completed your treatment plan.  Make sure you  Understand these instructions.  Will watch your  condition.  Will get help right away if you are not doing well or get worse.  Your e-visit answers were reviewed by a board certified advanced clinical practitioner to complete your personal care plan.  Depending on the condition, your plan could have included both over the counter or prescription medications.  If there is a problem please reply  once you have received a response from your provider.  Your safety is important to us.  If you have drug allergies check your prescription carefully.    You can use MyChart to ask questions about today's visit, request a non-urgent call back, or ask for a work or school excuse for 24 hours related to this e-Visit. If it has been greater than 24 hours you will need to follow up with your provider, or enter a new e-Visit to address those concerns.  You will get an e-mail in the next two days asking about your experience.  I hope that your e-visit has been valuable and will speed your recovery. Thank you for using e-visits.   **Please do not respond to this message unless you have follow up questions.** Greater than 5 but less than 10 minutes spent researching, coordinating, and implementing care for this patient today  

## 2020-04-30 ENCOUNTER — Telehealth: Payer: Self-pay | Admitting: Primary Care

## 2020-04-30 DIAGNOSIS — Z03818 Encounter for observation for suspected exposure to other biological agents ruled out: Secondary | ICD-10-CM | POA: Diagnosis not present

## 2020-04-30 DIAGNOSIS — Z20822 Contact with and (suspected) exposure to covid-19: Secondary | ICD-10-CM | POA: Diagnosis not present

## 2020-04-30 NOTE — Telephone Encounter (Signed)
Spoken and notified patient of Dr Cody's comments. Verbalized understanding. Patient will call office back and schedule a virtual appointment. Patient wanted to get the note as soon as possible so wants to see next available provider, she did not want to wait for Allie Bossier to return to the office.

## 2020-04-30 NOTE — Telephone Encounter (Signed)
Pt called stating she tested positive for covid 04/30/20 rapid test [facility] .  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   She is aware that she needs to quarantine   Pt has congestion.

## 2020-04-30 NOTE — Telephone Encounter (Signed)
Per CDC Guidelines she will need to quarantine for 10 days following the Covid positive test. 05/10/2020  If she needs a work note she will need to make an appointment to discuss symptoms and results

## 2020-05-01 ENCOUNTER — Other Ambulatory Visit: Payer: Self-pay

## 2020-05-01 ENCOUNTER — Encounter: Payer: Self-pay | Admitting: Family Medicine

## 2020-05-01 ENCOUNTER — Telehealth (INDEPENDENT_AMBULATORY_CARE_PROVIDER_SITE_OTHER): Payer: BC Managed Care – PPO | Admitting: Family Medicine

## 2020-05-01 DIAGNOSIS — U071 COVID-19: Secondary | ICD-10-CM

## 2020-05-01 NOTE — Progress Notes (Signed)
    I connected with [name] on 05/01/20 at  8:40 AM EDT by video and verified that I am speaking with the correct person using two identifiers.   I discussed the limitations, risks, security and privacy concerns of performing an evaluation and management service by video and the availability of in person appointments. I also discussed with the patient that there may be a patient responsible charge related to this service. The patient expressed understanding and agreed to proceed.  Patient location: home Provider Location: [facility] Participants: Lesleigh Noe and [name]   Subjective:     NYCHELLE CASSATA is a 39 y.o. female presenting for work note (Covid + results yesterday )     HPI   #Covid - 04/30/2020 positive test - currently feeling OK - mild congestion and cough, slight diarrhea - symptoms started on 8/14 with symptoms started - no known exposure to someone prior to that   Review of Systems   Social History   Tobacco Use  Smoking Status Current Every Day Smoker  . Packs/day: 0.50  . Years: 10.00  . Pack years: 5.00  Smokeless Tobacco Never Used  Tobacco Comment   Has not tried any quit aids/tn        Objective:   BP Readings from Last 3 Encounters:  03/16/20 122/84  02/16/20 127/86  12/06/19 109/74   Wt Readings from Last 3 Encounters:  03/16/20 154 lb (69.9 kg)  02/16/20 155 lb 8 oz (70.5 kg)  12/06/19 154 lb 6.4 oz (70 kg)   There were no vitals taken for this visit.   Physical Exam Constitutional:      Appearance: Normal appearance. She is not ill-appearing.  HENT:     Head: Normocephalic and atraumatic.     Right Ear: External ear normal.     Left Ear: External ear normal.  Eyes:     Conjunctiva/sclera: Conjunctivae normal.  Pulmonary:     Effort: Pulmonary effort is normal. No respiratory distress.  Neurological:     Mental Status: She is alert. Mental status is at baseline.  Psychiatric:         Mood and Affect: Mood normal.        Behavior: Behavior normal.        Thought Content: Thought content normal.        Judgment: Judgment normal.             Assessment & Plan:   Problem List Items Addressed This Visit    None    Visit Diagnoses    COVID-19 virus infection    -  Primary     Discussed OTC treatment for viral illness ER precautions given  Letter for work provided to be out 10 days from start of illness   Return if symptoms worsen or fail to improve.  Lesleigh Noe, MD

## 2020-05-07 NOTE — Telephone Encounter (Signed)
Pt first day out of work 8/16 returned 8/25

## 2020-05-12 NOTE — Progress Notes (Deleted)
Virginia Bird  [phone]) [phone] Fax:(336) [phone]  ID: Virginia Bird OB: 27-Jan-1981  MR#: [id number]  ZDG#:[id number]  Patient Care Team: Pleas Koch, NP as PCP - General (Internal Medicine)  I connected with Virginia Bird on 05/12/20 at  2:15 PM EDT by {Blank single:19197::"video enabled telemedicine visit","telephone visit"} and verified that I am speaking with the correct person using two identifiers.   I discussed the limitations, risks, security and privacy concerns of performing an evaluation and management service by telemedicine and the availability of in-person appointments. I also discussed with the patient that there may be a patient responsible charge related to this service. The patient expressed understanding and agreed to proceed.   Other persons participating in the visit and their role in the encounter: Patient, MD.  Patient's location: Home. Provider's location: Clinic.  CHIEF COMPLAINT: Iron deficiency anemia.  INTERVAL HISTORY: Patient returns to clinic today for repeat laboratory work, further evaluation, and consideration of additional IV Feraheme.  She continues to have weakness and fatigue, but states this has improved since receiving treatment 3 months ago.  She has a history of fibroids and heavy menstrual bleeding. She has no neurologic complaints.  She denies any recent fevers or illnesses.  She has a good appetite and denies weight loss.  She has no chest pain, shortness of breath, cough, or hemoptysis.  She denies any nausea, vomiting, constipation, or diarrhea.  She has no melena or hematochezia.  She has no urinary complaints.  Patient offers no further specific complaints today.  REVIEW OF SYSTEMS:   Review of Systems  Constitutional: Positive for malaise/fatigue. Negative for fever and weight loss.  Respiratory: Negative.  Negative for cough and shortness of breath.   Cardiovascular: Negative.  Negative for chest  pain and leg swelling.  Gastrointestinal: Negative.  Negative for abdominal pain, blood in stool and melena.  Genitourinary: Negative.  Negative for hematuria.  Musculoskeletal: Negative.  Negative for back pain.  Skin: Negative.  Negative for rash.  Neurological: Positive for weakness. Negative for dizziness, focal weakness and headaches.  Psychiatric/Behavioral: Negative.  The patient is not nervous/anxious.     As per HPI. Otherwise, a complete review of systems is negative.  PAST MEDICAL HISTORY: Past Medical History:  Diagnosis Date  . Abnormal Pap smear of cervix   . Chickenpox   . UTI (urinary tract infection)     PAST SURGICAL HISTORY: Past Surgical History:  Procedure Laterality Date  . 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 Thyroid disease Mother   . Hypertension Mother   . Cancer Father        leukemia  . Stroke Father   . Cancer Maternal Aunt        Breast Cancer  . Breast cancer Maternal Aunt   . Asthma Sister   . Miscarriages / [place] Sister     ADVANCED DIRECTIVES (Y/N):  N  HEALTH MAINTENANCE: Social History   Tobacco Use  . Smoking status: Current Every Day Smoker    Packs/day: 0.50    Years: 10.00    Pack years: 5.00  . Smokeless tobacco: Never Used  . Tobacco comment: Has not tried any quit aids/tn  Substance Use Topics  . Alcohol use: Yes    Alcohol/week: 0.0 standard drinks    Comment: weekends  . Drug use: No  Colonoscopy:  PAP:  Bone density:  Lipid panel:  No Known Allergies  Current Outpatient Medications  Medication Sig Dispense Refill  . amoxicillin-clavulanate (AUGMENTIN) 875-125 MG tablet Take 1 tablet by mouth 2 (two) times daily. One po bid x 7 days 14 tablet 0  . EPINEPHrine 0.3 mg/0.3 mL IJ SOAJ injection Inject 0.3 mLs (0.3 mg total) into the muscle as needed for anaphylaxis. 1 each  0   No current facility-administered medications for this visit.    OBJECTIVE: There were no vitals filed for this visit.   There is no height or weight on file to calculate BMI.    ECOG FS:0 - Asymptomatic  General: Well-developed, well-nourished, no acute distress. Eyes: Pink conjunctiva, anicteric sclera. HEENT: Normocephalic, moist mucous membranes. Lungs: No audible wheezing or coughing. Heart: Regular rate and rhythm. Abdomen: Soft, nontender, no obvious distention. Musculoskeletal: No edema, cyanosis, or clubbing. Neuro: Alert, answering all questions appropriately. Cranial nerves grossly intact. Skin: No rashes or petechiae noted. Psych: Normal affect.  LAB RESULTS:  Lab Results  Component Value Date   NA 138 11/08/2019   K 3.5 11/08/2019   CL 102 11/08/2019   CO2 29 11/08/2019   GLUCOSE 92 11/08/2019   BUN 7 11/08/2019   CREATININE 0.72 11/08/2019   CALCIUM 8.9 11/08/2019   PROT 7.0 11/08/2019   ALBUMIN 3.6 11/08/2019   AST 13 11/08/2019   ALT 8 11/08/2019   ALKPHOS 56 11/08/2019   BILITOT 0.4 11/08/2019   GFRNONAA >60 04/11/2009   GFRAA  04/11/2009    >60        The eGFR has been calculated using the MDRD equation. This calculation has not been validated in all clinical situations. eGFR's persistently <60 mL/min signify possible Chronic Kidney Disease.    Lab Results  Component Value Date   WBC 7.9 02/15/2020   NEUTROABS 5.2 02/15/2020   HGB 11.3 (L) 02/15/2020   HCT 34.7 (L) 02/15/2020   MCV 85.0 02/15/2020   PLT 349 02/15/2020   Lab Results  Component Value Date   IRON 29 02/15/2020   TIBC 284 02/15/2020   IRONPCTSAT 10 (L) 02/15/2020   Lab Results  Component Value Date   FERRITIN 29 02/15/2020     STUDIES: No results found.  ASSESSMENT: Iron deficiency anemia.  PLAN:    1. Iron deficiency anemia: Likely secondary to fibroids and heavy menstrual bleeding.  Patient's hemoglobin and iron stores have significantly improved, but  still mildly decreased.  She also remains mildly symptomatic.  Will proceed with 1 additional infusion of 510 mg IV Feraheme today.  Return to clinic in 3 months with repeat laboratory work and video assisted telemedicine visit.   2.  Fibroids: Continue follow-up with OB/GYN as scheduled. 3.  Thrombocytosis: Resolved.  I provided *** minutes of {Blank single:19197::"face-to-face video visit time","non face-to-face telephone visit time"} during this encounter which included chart review, counseling, and coordination of care as documented above.    Patient expressed understanding and was in agreement with this plan. She also understands that She can call clinic at any time with any questions, concerns, or complaints.    Lloyd Huger, MD   05/12/2020 10:40 AM

## 2020-05-14 ENCOUNTER — Other Ambulatory Visit: Payer: Self-pay

## 2020-05-14 ENCOUNTER — Inpatient Hospital Stay: Payer: BC Managed Care – PPO | Attending: Oncology

## 2020-05-14 DIAGNOSIS — D509 Iron deficiency anemia, unspecified: Secondary | ICD-10-CM | POA: Diagnosis not present

## 2020-05-14 LAB — IRON AND TIBC
Iron: 42 ug/dL (ref 28–170)
Saturation Ratios: 14 % (ref 10.4–31.8)
TIBC: 312 ug/dL (ref 250–450)
UIBC: 270 ug/dL

## 2020-05-14 LAB — CBC WITH DIFFERENTIAL/PLATELET
Abs Immature Granulocytes: 0.02 10*3/uL (ref 0.00–0.07)
Basophils Absolute: 0 10*3/uL (ref 0.0–0.1)
Basophils Relative: 1 %
Eosinophils Absolute: 0.2 10*3/uL (ref 0.0–0.5)
Eosinophils Relative: 3 %
HCT: 34.9 % — ABNORMAL LOW (ref 36.0–46.0)
Hemoglobin: 11.6 g/dL — ABNORMAL LOW (ref 12.0–15.0)
Immature Granulocytes: 0 %
Lymphocytes Relative: 23 %
Lymphs Abs: 1.9 10*3/uL (ref 0.7–4.0)
MCH: 27.7 pg (ref 26.0–34.0)
MCHC: 33.2 g/dL (ref 30.0–36.0)
MCV: 83.3 fL (ref 80.0–100.0)
Monocytes Absolute: 0.6 10*3/uL (ref 0.1–1.0)
Monocytes Relative: 7 %
Neutro Abs: 5.6 10*3/uL (ref 1.7–7.7)
Neutrophils Relative %: 66 %
Platelets: 338 10*3/uL (ref 150–400)
RBC: 4.19 MIL/uL (ref 3.87–5.11)
RDW: 14 % (ref 11.5–15.5)
WBC: 8.4 10*3/uL (ref 4.0–10.5)
nRBC: 0 % (ref 0.0–0.2)

## 2020-05-14 LAB — FERRITIN: Ferritin: 22 ng/mL (ref 11–307)

## 2020-05-17 ENCOUNTER — Telehealth: Payer: Self-pay | Admitting: Primary Care

## 2020-05-17 ENCOUNTER — Inpatient Hospital Stay: Payer: BC Managed Care – PPO | Admitting: Oncology

## 2020-05-17 NOTE — Telephone Encounter (Signed)
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   Last year she had an allergic reaction to working with the corn.  It was hard for her to breath  Pt stated she went to [place] allergy  Prior to working with the corn.

## 2020-05-18 NOTE — Telephone Encounter (Signed)
Advised patient she needs to be seen to discuss allergies and get work note.  Appointment scheduled.

## 2020-05-18 NOTE — Telephone Encounter (Signed)
We will need to visit to discuss, or she can call [facility] for the note. Virtual is fine.

## 2020-05-23 ENCOUNTER — Other Ambulatory Visit: Payer: Self-pay

## 2020-05-23 ENCOUNTER — Ambulatory Visit: Payer: BC Managed Care – PPO | Admitting: Primary Care

## 2020-05-23 ENCOUNTER — Encounter: Payer: Self-pay | Admitting: Primary Care

## 2020-05-23 DIAGNOSIS — J3089 Other allergic rhinitis: Secondary | ICD-10-CM | POA: Diagnosis not present

## 2020-05-23 NOTE — Patient Instructions (Signed)
It was a pleasure to see you today!   

## 2020-05-23 NOTE — Progress Notes (Signed)
Subjective:    Patient ID: Virginia Bird, female    DOB: August 08, 1981, 39 y.o.   MRN: [id number]  HPI  This visit occurred during the SARS-CoV-2 public health emergency.  Safety protocols were in place, including screening questions prior to the visit, additional usage of staff PPE, and extensive cleaning of exam room while observing appropriate contact time as indicated for disinfecting solutions.   Virginia Bird is a 39 year female with a history of food allergies, iron deficiency anemia who presents today for a work note excuse.   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 One year ago she was required to sit outdoors for eight hours shucking corn. The following day she woke up with chest tightness with shortness of breath after being outdoors for so long.  History of allergic reaction to food with symptoms of lip swelling, throat closure. She does carry an EpiPen.  She does take Zyrtec for a period of time twice annually during seasonal changes for environmental seasonal changes. Today she is requesting to be excused from further prolonged outdoor work due to allergy symptoms.   Review of Systems  Constitutional: Negative for fever.  Respiratory: Negative for shortness of breath.   Cardiovascular: Negative for chest pain.  Allergic/Immunologic: Positive for environmental allergies and food allergies.       Past Medical History:  Diagnosis Date  . Abnormal Pap smear of cervix   . Chickenpox   . UTI (urinary tract infection)      Social History   Socioeconomic History  . Marital status: Single    Spouse name: Not on file  . Number of children: Not on file  . Years of education: Not on file  . Highest education level: Not on file  Occupational History  . Not on file  Tobacco Use  . Smoking status: Current Every Day Smoker    Packs/day: 0.50     Years: 10.00    Pack years: 5.00  . Smokeless tobacco: Never Used  . Tobacco comment: Has not tried any quit aids/tn  Substance and Sexual Activity  . Alcohol use: Yes    Alcohol/week: 0.0 standard drinks    Comment: weekends  . Drug use: No  . Sexual activity: Yes    Partners: Male  Other Topics Concern  . Not on file  Social History Narrative   Single.    1 daughter.    Works at [employer].   Enjoys going to [employer].    Social Determinants of Health   Financial Resource Strain:   . Difficulty of Paying Living Expenses: Not on file  Food Insecurity:   . Worried About Charity fundraiser in the Last Year: Not on file  . Ran Out of Food in the Last Year: Not on file  Transportation Needs:   . Lack of Transportation (Medical): Not on file  . Lack of Transportation (Non-Medical): Not on file  Physical Activity:   . Days of Exercise per Week: Not on file  . Minutes of Exercise per Session: Not on file  Stress:   . Feeling of Stress : Not on file  Social Connections:   . Frequency of Communication with Friends and Family: Not on file  . Frequency of Social Gatherings with Friends and Family: Not on file  . Attends Religious Services: Not on file  .  Active Member of Clubs or Organizations: Not on file  . Attends Archivist Meetings: Not on file  . Marital Status: Not on file  Intimate Partner Violence:   . Fear of Current or Ex-Partner: Not on file  . Emotionally Abused: Not on file  . Physically Abused: Not on file  . Sexually Abused: Not on file    Past Surgical History:  Procedure Laterality Date  . 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 Thyroid disease Mother   . Hypertension Mother   . Cancer Father        leukemia  . Stroke Father   . Cancer Maternal Aunt        Breast Cancer  . Breast cancer Maternal Aunt   . Asthma Sister   .  Miscarriages / Stillbirths Sister     No Known Allergies  Current Outpatient Medications on File Prior to Visit  Medication Sig Dispense Refill  . amoxicillin-clavulanate (AUGMENTIN) 875-125 MG tablet Take 1 tablet by mouth 2 (two) times daily. One po bid x 7 days 14 tablet 0  . EPINEPHrine 0.3 mg/0.3 mL IJ SOAJ injection Inject 0.3 mLs (0.3 mg total) into the muscle as needed for anaphylaxis. 1 each 0   No current facility-administered medications on file prior to visit.    BP 118/78   Pulse (!) 104   Ht 5\' 1"  (1.549 m)   Wt 154 lb (69.9 kg)   LMP 05/20/2020   SpO2 98%   BMI 29.10 kg/m    Objective:   Physical Exam Cardiovascular:     Rate and Rhythm: Normal rate and regular rhythm.  Pulmonary:     Effort: Pulmonary effort is normal.     Breath sounds: Normal breath sounds.  Musculoskeletal:     Cervical back: Neck supple.  Skin:    General: Skin is warm and dry.            Assessment & Plan:

## 2020-05-23 NOTE — Assessment & Plan Note (Signed)
Environmental and seasonal allergies with moderate symptoms after prolonged outdoor exposure. Exam today benign.   Agree to provide work excuse from prolonged outdoor work given allergy symptoms and history of what sounds like mild anaphylaxis to food.   Discussed use of Zyrtec if needed.

## 2020-06-08 ENCOUNTER — Telehealth: Payer: BC Managed Care – PPO | Admitting: Physician Assistant

## 2020-06-08 DIAGNOSIS — B9789 Other viral agents as the cause of diseases classified elsewhere: Secondary | ICD-10-CM | POA: Diagnosis not present

## 2020-06-08 DIAGNOSIS — J329 Chronic sinusitis, unspecified: Secondary | ICD-10-CM

## 2020-06-08 MED ORDER — FLUTICASONE PROPIONATE 50 MCG/ACT NA SUSP
2.0000 | Freq: Every day | NASAL | 6 refills | Status: DC
Start: 2020-06-08 — End: 2021-07-11

## 2020-06-08 NOTE — Addendum Note (Signed)
Addended by: Waldon Merl on: 06/08/2020 06:56 PM   Modules accepted: Orders

## 2020-06-08 NOTE — Progress Notes (Signed)
  We are sorry that you are not feeling well.  Here is how we plan to help!  Based on what you have shared with me it looks like you have sinusitis.  Sinusitis is inflammation and infection in the sinus cavities of the head.  Based on your presentation I believe you most likely have Acute Viral Sinusitis.This is an infection most likely caused by a virus. There is not specific treatment for viral sinusitis other than to help you with the symptoms until the infection runs its course.  You may use an oral decongestant such as Mucinex D or if you have glaucoma or high blood pressure use plain Mucinex. Saline nasal spray help and can safely be used as often as needed for congestion, I have prescribed: Fluticasone nasal spray two sprays in each nostril once a day  Ms. Loth It appears that you may have a sinus infection, likely viral in nature. I would advise that you stop using the Mucinex and use the prescribed nasal spray.   Some authorities believe that zinc sprays or the use of Echinacea may shorten the course of your symptoms.  Sinus infections are not as easily transmitted as other respiratory infection, however we still recommend that you avoid close contact with loved ones, especially the very young and elderly.  Remember to wash your hands thoroughly throughout the day as this is the number one way to prevent the spread of infection!  Home Care:  Only take medications as instructed by your medical team.  Do not take these medications with alcohol.  A steam or ultrasonic humidifier can help congestion.  You can place a towel over your head and breathe in the steam from hot water coming from a faucet.  Avoid close contacts especially the very young and the elderly.  Cover your mouth when you cough or sneeze.  Always remember to wash your hands.  Get Help Right Away If:  You develop worsening fever or sinus pain.  You develop a severe head ache or visual changes.  Your symptoms  persist after you have completed your treatment plan.  Make sure you  Understand these instructions.  Will watch your condition.  Will get help right away if you are not doing well or get worse.  Your e-visit answers were reviewed by a board certified advanced clinical practitioner to complete your personal care plan.  Depending on the condition, your plan could have included both over the counter or prescription medications.  If there is a problem please reply  once you have received a response from your provider.  Your safety is important to [place].  If you have drug allergies check your prescription carefully.    You can use MyChart to ask questions about today's visit, request a non-urgent call back, or ask for a work or school excuse for 24 hours related to this e-Visit. If it has been greater than 24 hours you will need to follow up with your provider, or enter a new e-Visit to address those concerns.  You will get an e-mail in the next two days asking about your experience.  I hope that your e-visit has been valuable and will speed your recovery. Thank you for using e-visits.   I spent 5-10 minutes on review and completion of this note- Lacy Duverney [facility]

## 2020-07-10 NOTE — Telephone Encounter (Signed)
Will you give her a call?

## 2020-07-28 NOTE — Progress Notes (Signed)
[place]  Telephone:(336) [phone] Fax:(336) [phone]  ID: Virginia Bird OB: 07-28-1981  MR#: [id number]  PFX#:[id number]  Patient Care Team: Pleas Koch, NP as PCP - General (Internal Medicine)  CHIEF COMPLAINT: Iron deficiency anemia.  INTERVAL HISTORY: Patient returns to clinic today for repeat laboratory work, further evaluation, and consideration of additional IV Feraheme.  She currently feels well and is asymptomatic.  She does not complain of any weakness or fatigue.  She continues to have heavy menstrual bleeding, but states this is mildly improved. She has no neurologic complaints.  She denies any recent fevers or illnesses.  She has a good appetite and denies weight loss.  She has no chest pain, shortness of breath, cough, or hemoptysis.  She denies any nausea, vomiting, constipation, or diarrhea.  She has no melena or hematochezia.  She has no urinary complaints.  Patient offers no specific complaints today.  REVIEW OF SYSTEMS:   Review of Systems  Constitutional: Negative.  Negative for fever, malaise/fatigue and weight loss.  Respiratory: Negative.  Negative for cough and shortness of breath.   Cardiovascular: Negative.  Negative for chest pain and leg swelling.  Gastrointestinal: Negative.  Negative for abdominal pain, blood in stool and melena.  Genitourinary: Negative.  Negative for hematuria.  Musculoskeletal: Negative.  Negative for back pain.  Skin: Negative.  Negative for rash.  Neurological: Negative.  Negative for dizziness, focal weakness, weakness and headaches.  Psychiatric/Behavioral: Negative.  The patient is not nervous/anxious.     As per HPI. Otherwise, a complete review of systems is negative.  PAST MEDICAL HISTORY: Past Medical History:  Diagnosis Date  . Abnormal Pap smear of cervix   . Chickenpox   . UTI (urinary tract infection)     PAST SURGICAL HISTORY: Past Surgical History:  Procedure Laterality Date  .  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 Thyroid disease Mother   . Hypertension Mother   . Cancer Father        leukemia  . Stroke Father   . Cancer Maternal Aunt        Breast Cancer  . Breast cancer Maternal Aunt   . Asthma Sister   . Miscarriages / [place] Sister     ADVANCED DIRECTIVES (Y/N):  N  HEALTH MAINTENANCE: Social History   Tobacco Use  . Smoking status: Current Every Day Smoker    Packs/day: 0.50    Years: 10.00    Pack years: 5.00  . Smokeless tobacco: Never Used  . Tobacco comment: Has not tried any quit aids/tn  Substance Use Topics  . Alcohol use: Yes    Alcohol/week: 0.0 standard drinks    Comment: weekends  . Drug use: No     Colonoscopy:  PAP:  Bone density:  Lipid panel:  No Known Allergies  Current Outpatient Medications  Medication Sig Dispense Refill  . EPINEPHrine 0.3 mg/0.3 mL IJ SOAJ injection Inject 0.3 mLs (0.3 mg total) into the muscle as needed for anaphylaxis. 1 each 0  . fluticasone (FLONASE) 50 MCG/ACT nasal spray Place 2 sprays into both nostrils daily. 16 g 6   No current facility-administered medications for this visit.    OBJECTIVE: Vitals:   08/02/20 1428  BP: (!) 135/96  Pulse: 90  Temp: 99 F (37.2 C)  SpO2: 100%     Body  mass index is 29.4 kg/m.    ECOG FS:0 - Asymptomatic  General: Well-developed, well-nourished, no acute distress. Eyes: Pink conjunctiva, anicteric sclera. HEENT: Normocephalic, moist mucous membranes. Lungs: No audible wheezing or coughing. Heart: Regular rate and rhythm. Abdomen: Soft, nontender, no obvious distention. Musculoskeletal: No edema, cyanosis, or clubbing. Neuro: Alert, answering all questions appropriately. Cranial nerves grossly intact. Skin: No rashes or petechiae noted. Psych: Normal affect.   LAB RESULTS:  Lab Results  Component Value  Date   NA 138 11/08/2019   K 3.5 11/08/2019   CL 102 11/08/2019   CO2 29 11/08/2019   GLUCOSE 92 11/08/2019   BUN 7 11/08/2019   CREATININE 0.72 11/08/2019   CALCIUM 8.9 11/08/2019   PROT 7.0 11/08/2019   ALBUMIN 3.6 11/08/2019   AST 13 11/08/2019   ALT 8 11/08/2019   ALKPHOS 56 11/08/2019   BILITOT 0.4 11/08/2019   GFRNONAA >60 04/11/2009   GFRAA  04/11/2009    >60        The eGFR has been calculated using the MDRD equation. This calculation has not been validated in all clinical situations. eGFR's persistently <60 mL/min signify possible Chronic Kidney Disease.    Lab Results  Component Value Date   WBC 8.4 05/14/2020   NEUTROABS 5.6 05/14/2020   HGB 11.6 (L) 05/14/2020   HCT 34.9 (L) 05/14/2020   MCV 83.3 05/14/2020   PLT 338 05/14/2020   Lab Results  Component Value Date   IRON 42 05/14/2020   TIBC 312 05/14/2020   IRONPCTSAT 14 05/14/2020   Lab Results  Component Value Date   FERRITIN 22 05/14/2020     STUDIES: No results found.  ASSESSMENT: Iron deficiency anemia.  PLAN:    1. Iron deficiency anemia: Likely secondary to fibroids and heavy menstrual bleeding.  Patient's hemoglobin remains mildly decreased, but improved.  Iron stores are now within normal limits.  She does not require additional IV Feraheme today.  Return to clinic in 4 months with repeat laboratory work, video assisted telemedicine visit, and consideration of treatment if needed.    2.  Fibroids: Continue follow-up with OB/GYN as scheduled. 3.  Thrombocytosis: Resolved.  I spent a total of 20 minutes reviewing chart data, face-to-face evaluation with the patient, counseling and coordination of care as detailed above.   Patient expressed understanding and was in agreement with this plan. She also understands that She can call clinic at any time with any questions, concerns, or complaints.    Lloyd Huger, MD   08/04/2020 11:42 AM

## 2020-08-02 ENCOUNTER — Inpatient Hospital Stay: Payer: BC Managed Care – PPO | Attending: Oncology | Admitting: Oncology

## 2020-08-02 VITALS — BP 135/96 | HR 90 | Temp 99.0°F | Wt 155.6 lb

## 2020-08-02 DIAGNOSIS — D509 Iron deficiency anemia, unspecified: Secondary | ICD-10-CM | POA: Diagnosis not present

## 2020-08-02 DIAGNOSIS — F17200 Nicotine dependence, unspecified, uncomplicated: Secondary | ICD-10-CM | POA: Diagnosis not present

## 2020-08-02 NOTE — Progress Notes (Signed)
Patient denies new problems/concerns today.   °

## 2020-08-12 ENCOUNTER — Ambulatory Visit (HOSPITAL_COMMUNITY): Payer: Self-pay

## 2020-08-21 ENCOUNTER — Ambulatory Visit
Admission: RE | Admit: 2020-08-21 | Discharge: 2020-08-21 | Disposition: A | Discharge status: Home / Self Care | Payer: BC Managed Care – PPO | Source: Ambulatory Visit | Attending: Physician Assistant | Admitting: Physician Assistant

## 2020-08-21 ENCOUNTER — Other Ambulatory Visit: Payer: Self-pay

## 2020-08-21 VITALS — BP 146/92 | HR 104 | Temp 98.5°F | Resp 18 | Ht 61.0 in | Wt 155.6 lb

## 2020-08-21 DIAGNOSIS — K649 Unspecified hemorrhoids: Secondary | ICD-10-CM

## 2020-08-21 DIAGNOSIS — R21 Rash and other nonspecific skin eruption: Secondary | ICD-10-CM | POA: Diagnosis not present

## 2020-08-21 DIAGNOSIS — K6289 Other specified diseases of anus and rectum: Secondary | ICD-10-CM | POA: Diagnosis not present

## 2020-08-21 MED ORDER — CLOTRIMAZOLE 1 % EX CREA: 1.0000 "application " | TOPICAL_CREAM | Freq: Two times a day (BID) | CUTANEOUS | 0 refills | Status: AC

## 2020-08-21 MED ORDER — HYDROCORTISONE ACETATE 25 MG RE SUPP: 25.0000 mg | Freq: Two times a day (BID) | RECTAL | 0 refills | Status: AC

## 2020-08-21 NOTE — Discharge Instructions (Signed)
Continue using the Preparation H cream.  Begin doing warm baths 1-2 times daily.  Make sure to try that anal area well.  Apply Lotrimin cream as you likely have a fungal rash of the buttocks.  I have also prescribed Anusol which is a suppository to help with swelling and pain inside the rectum.  You can take ibuprofen or Tylenol for added pain relief.  If you are still having symptoms after 2 weeks, please follow-up with PCP as you may need referral to GI specialist.

## 2020-08-21 NOTE — ED Triage Notes (Signed)
Pt c/o pain in her rectal area. Started about 6 weeks ago. She states it has come and gone within the last 6 weeks but never completely resolved. Denies blood stools.

## 2020-08-21 NOTE — ED Provider Notes (Signed)
[facility]    CSN: [id number] Arrival date & time: 08/21/20  1824      History   Chief Complaint Chief Complaint  Patient presents with  . Appointment    prefers female provider  . Rectal Pain    HPI Virginia Bird is a 39 y.o. female presenting for rectal pain x 6 weeks. She says she noticed swollen skin a few weeks ago associated with the discomfort and began using Preparation H which has improved symptoms and she says the swollen skin is better. She admits to continued anal pain with sitting and BMs. Reports normal BMs daily. No blood in stool or dark stool. Denies abdominal pain, n/v, diarrhea or constipation. Denies similar problem in the past. No associated fever or weakness. Admits to some mild anal itching. She says she has not looked at the area to notice if there is any changes to the skin color. Patient denies any other concerns.  HPI  Past Medical History:  Diagnosis Date  . Abnormal Pap smear of cervix   . Chickenpox   . UTI (urinary tract infection)     Patient Active Problem List   Diagnosis Date Noted  . Environmental and seasonal allergies 05/23/2020  . Iron deficiency anemia 11/12/2019  . Preventative health care 11/08/2019  . Fibroids 09/09/2018  . Multiple food allergies 05/28/2018  . 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 Ovarian cyst 01/27/2012    Past Surgical History:  Procedure Laterality Date  . 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 Authorizing Provider  fluticasone (FLONASE) 50 MCG/ACT nasal spray Place 2  sprays into both nostrils daily. 06/08/20  Yes Alene Mires, Sahar M, PA-C  clotrimazole (LOTRIMIN) 1 % cream Apply 1 application topically 2 (two) times daily for 14 days. 08/21/20 09/04/20  Laurene Footman B, PA-C  EPINEPHrine 0.3 mg/0.3 mL IJ SOAJ injection Inject 0.3 mLs (0.3 mg total) into the muscle as needed for anaphylaxis. 11/08/19   Pleas Koch, NP  hydrocortisone (ANUSOL-HC) 25 MG suppository Place 1 suppository (25 mg total) rectally 2 (two) times daily for 7 days. 08/21/20 08/28/20  Danton Clap, PA-C    Family History Family History  Problem Relation Age of Onset  . Thyroid disease Mother   . Hypertension Mother   . Cancer Father        leukemia  . Stroke Father   . Cancer Maternal Aunt        Breast Cancer  . Breast cancer Maternal Aunt   . Asthma Sister   . Miscarriages / [place] Sister     Social History Social History   Tobacco Use  . Smoking status: Current Every Day Smoker    Packs/day: 0.50    Years: 10.00    Pack years: 5.00  . Smokeless tobacco: Never  Used  . Tobacco comment: Has not tried any quit aids/tn  Vaping Use  . Vaping Use: Never used  Substance Use Topics  . Alcohol use: Yes    Alcohol/week: 0.0 standard drinks    Comment: weekends  . Drug use: No     Allergies   Patient has no known allergies.   Review of Systems Review of Systems  Constitutional: Negative for fatigue and fever.  Gastrointestinal: Positive for rectal pain. Negative for abdominal pain, anal bleeding, blood in stool, nausea and vomiting.  Skin: Negative for rash.     Physical Exam Triage Vital Signs ED Triage Vitals  Enc Vitals Group     BP 08/21/20 1840 (!) 146/92     Pulse Rate 08/21/20 1840 (!) 104     Resp 08/21/20 1840 18     Temp 08/21/20 1840 98.5 F (36.9 C)     Temp Source 08/21/20 1840 Oral     SpO2 08/21/20 1840 100 %     Weight 08/21/20 1837 155 lb 10.3 oz (70.6 kg)     Height 08/21/20 1837 5\' 1"  (1.549 m)     Head Circumference --       Peak Flow --      Pain Score 08/21/20 1837 7     Pain Loc --      Pain Edu? --      Excl. in [place]? --    No data found.  Updated Vital Signs BP (!) 146/92 (BP Location: Left Arm)   Pulse (!) 104   Temp 98.5 F (36.9 C) (Oral)   Resp 18   Ht 5\' 1"  (1.549 m)   Wt 155 lb 10.3 oz (70.6 kg)   LMP 08/08/2020 (Approximate)   SpO2 100%   BMI 29.41 kg/m       Physical Exam Vitals and nursing note reviewed.  Constitutional:      General: She is not in acute distress.    Appearance: Normal appearance. She is not ill-appearing or toxic-appearing.  HENT:     Head: Normocephalic and atraumatic.  Eyes:     General: No scleral icterus.       Right eye: No discharge.        Left eye: No discharge.     Conjunctiva/sclera: Conjunctivae normal.  Cardiovascular:     Rate and Rhythm: Normal rate.  Pulmonary:     Effort: Pulmonary effort is normal. No respiratory distress.  Abdominal:     Palpations: Abdomen is soft.     Tenderness: There is no abdominal tenderness.  Genitourinary:    Rectum: Tenderness and external hemorrhoid present.     Comments: Erythematous rash around anus and perineum. Patient unable to fully cooperate with rectal exam and asked me to stop as I was advancing my finger. Musculoskeletal:     Cervical back: Neck supple.  Skin:    General: Skin is dry.  Neurological:     General: No focal deficit present.     Mental Status: She is alert. Mental status is at baseline.     Motor: No weakness.     Gait: Gait normal.  Psychiatric:        Mood and Affect: Mood normal.        Behavior: Behavior normal.        Thought Content: Thought content normal.      UC Treatments / Results  Labs (all labs ordered are listed, but only abnormal results are displayed) Labs Reviewed - No data to  display  EKG   Radiology No results found.  Procedures Procedures (including critical care time)  Medications Ordered in UC Medications - No data to display  Initial  Impression / Assessment and Plan / UC Course  I have reviewed the triage vital signs and the nursing notes.  Pertinent labs & imaging results that were available during my care of the patient were reviewed by me and considered in my medical decision making (see chart for details).   Patient with hemorrhoids and rectal pain for the past 1.5 months.  She states that symptoms symptoms have improved but are still present so she thought she would get checked out.  She cannot tolerate a full rectal exam today.  I was able to visualize a large external hemorrhoid that is nontender.  She does have a tender and erythematous rash of the anus and perineum.  Treating at this time with Anusol suppository and Lotrimin cream.  Advised sitz bath's and continue Preparation H cream.  Advised to follow with PCP if not better in 2 weeks.  ED precautions discussed.   Final Clinical Impressions(s) / UC Diagnoses   Final diagnoses:  Rectal pain  Hemorrhoids, unspecified hemorrhoid type  Rash of perineum     Discharge Instructions     Continue using the Preparation H cream.  Begin doing warm baths 1-2 times daily.  Make sure to try that anal area well.  Apply Lotrimin cream as you likely have a fungal rash of the buttocks.  I have also prescribed Anusol which is a suppository to help with swelling and pain inside the rectum.  You can take ibuprofen or Tylenol for added pain relief.  If you are still having symptoms after 2 weeks, please follow-up with PCP as you may need referral to GI specialist.    ED Prescriptions    Medication Sig Dispense Auth. Provider   hydrocortisone (ANUSOL-HC) 25 MG suppository Place 1 suppository (25 mg total) rectally 2 (two) times daily for 7 days. 14 suppository Laurene Footman B, PA-C   clotrimazole (LOTRIMIN) 1 % cream Apply 1 application topically 2 (two) times daily for 14 days. 30 g Gretta Cool     PDMP not reviewed this encounter.   Danton Clap, PA-C 08/21/20  1948

## 2020-10-02 ENCOUNTER — Encounter: Payer: Self-pay | Admitting: Radiology

## 2020-10-03 DIAGNOSIS — Z1152 Encounter for screening for COVID-19: Secondary | ICD-10-CM | POA: Diagnosis not present

## 2020-10-04 NOTE — Telephone Encounter (Signed)
Patient has been schd

## 2020-10-05 ENCOUNTER — Telehealth (INDEPENDENT_AMBULATORY_CARE_PROVIDER_SITE_OTHER): Payer: BC Managed Care – PPO | Admitting: Primary Care

## 2020-10-05 ENCOUNTER — Encounter: Payer: Self-pay | Admitting: Primary Care

## 2020-10-05 DIAGNOSIS — U071 COVID-19: Secondary | ICD-10-CM | POA: Insufficient documentation

## 2020-10-05 HISTORY — DX: COVID-19: U07.1

## 2020-10-05 NOTE — Assessment & Plan Note (Signed)
Seems to have caught the omicron variant of Covid-19, sounds like she may have had Delta variant in August 2021. Recommended she think about Covid-19 vaccination as the next variant may not be as mild.  Discussed isolation instructions. She will be able to come out of isolation on January 25th as long as symptoms are much better and she is afebrile, discussed that she needs to wear a mask for the following 5 days.   Will provide work note. She will reach out if she has any questions, and/or if symptoms do not continue to resolve.

## 2020-10-05 NOTE — Patient Instructions (Signed)
Stay isolated until January 25th as discussed. You will need to wear a mask consistently  for the following 5 days after isolation.  Please update me if anything changes as discussed. Please consider vaccination against Covid-19.  It was a pleasure to see you today! Allie Bossier, NP-C

## 2020-10-05 NOTE — Progress Notes (Signed)
Subjective:    Patient ID: Virginia Bird, female    DOB: 1981/05/31, 40 y.o.   MRN: [id number]  HPI  Virtual Visit via Video Note  I connected with Virginia Bird on 10/05/20 at 10:20 AM EST by a video enabled telemedicine application and verified that I am speaking with the correct person using two identifiers.  Location: Patient: Home Provider: Office Participants: Patient and myself   I discussed the limitations of evaluation and management by telemedicine and the availability of in person appointments. The patient expressed understanding and agreed to proceed.  History of Present Illness:  Virginia Bird is a 40 year old female with a history of multiple food allergies, seasonal allergies, iron deficiency anemia who presents today to discuss positive Covid results and also to get a work note.  Symptoms began two days ago with nasal congestion, headache. She initially thought this was secondary to "sinuses" but something didn't feel "right" as her "voice went out". She went to a Covid-19 drive through clinic two days ago, tested positive for Covid-19. She has not been vaccinated as she does not believe in the vaccine. She had Covid-19 in August 2021, symptoms at that time were much worse.  Symptoms today include nasal congestion and headache which have improved. She denies fevers.    Observations/Objective:  Alert and oriented. Appears well, not sickly. No distress. Speaking in complete sentences. No cough during visit.  Assessment and Plan:  Seems to have caught the omicron variant of Covid-19, sounds like she may have had Delta variant in August 2021. Recommended she think about Covid-19 vaccination as the next variant may not be as mild.  Discussed isolation instructions. She will be able to come out of isolation on January 25th as long as symptoms are much better and she is afebrile, discussed that she needs to wear a mask for the following 5 days.   Will  provide work note. She will reach out if she has any questions, and/or if symptoms do not continue to resolve.   Follow Up Instructions:  Stay isolated until January 25th as discussed. You will need to wear a mask consistently  for the following 5 days after isolation.  Please update me if anything changes as discussed. Please consider vaccination against Covid-19.  It was a pleasure to see you today! Virginia Bossier, NP-C    I discussed the assessment and treatment plan with the patient. The patient was provided an opportunity to ask questions and all were answered. The patient agreed with the plan and demonstrated an understanding of the instructions.   The patient was advised to call back or seek an in-person evaluation if the symptoms worsen or if the condition fails to improve as anticipated.    Pleas Koch, NP    Review of Systems  Constitutional: Negative for chills, fatigue and fever.  HENT: Positive for congestion and sinus pressure. Negative for sore throat.   Respiratory: Negative for cough.   Cardiovascular: Negative for chest pain.  Neurological: Positive for headaches.       Past Medical History:  Diagnosis Date   Abnormal Pap smear of cervix    Chickenpox    UTI (urinary tract infection)      Social History   Socioeconomic History   Marital status: Single    Spouse name: Not on file   Number of children: Not on file   Years of education: Not on file   Highest education level: Not on file  Occupational History   Not on file  Tobacco Use   Smoking status: Current Every Day Smoker    Packs/day: 0.50    Years: 10.00    Pack years: 5.00   Smokeless tobacco: Never Used   Tobacco comment: Has not tried any quit aids/tn  Vaping Use   Vaping Use: Never used  Substance and Sexual Activity   Alcohol use: Yes    Alcohol/week: 0.0 standard drinks    Comment: weekends   Drug use: No   Sexual activity: Yes    Partners: Male  Other  Topics Concern   Not on file  Social History Narrative   Single.    1 daughter.    Works at [employer].   Enjoys going to [employer].    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 1 each 0   fluticasone (FLONASE) 50 MCG/ACT nasal spray Place 2 sprays into both nostrils daily. 16 g 6   No current facility-administered medications on file prior to visit.    Ht 5\' 1"  (1.549 m)    Wt 155 lb (70.3 kg)    BMI 29.29 kg/m    Objective:   Physical Exam Constitutional:      General: She is not in acute distress.    Appearance: She is not ill-appearing.  Pulmonary:     Effort: Pulmonary effort is normal.     Comments: No cough during visit Neurological:     Mental Status: She is alert and oriented to person, place, and time.  Psychiatric:        Mood and Affect: Mood normal.            Assessment & Plan:

## 2020-11-13 ENCOUNTER — Other Ambulatory Visit: Payer: Self-pay | Admitting: Primary Care

## 2020-11-13 DIAGNOSIS — Z1231 Encounter for screening mammogram for malignant neoplasm of breast: Secondary | ICD-10-CM

## 2020-11-27 ENCOUNTER — Encounter: Payer: Self-pay | Admitting: *Deleted

## 2020-11-27 ENCOUNTER — Encounter: Payer: Self-pay | Admitting: Primary Care

## 2020-11-27 ENCOUNTER — Ambulatory Visit: Payer: BC Managed Care – PPO | Admitting: Primary Care

## 2020-11-27 ENCOUNTER — Other Ambulatory Visit: Payer: Self-pay

## 2020-11-27 DIAGNOSIS — K649 Unspecified hemorrhoids: Secondary | ICD-10-CM | POA: Insufficient documentation

## 2020-11-27 MED ORDER — HYDROCORTISONE ACETATE 25 MG RE SUPP: 25.0000 mg | Freq: Two times a day (BID) | RECTAL | 0 refills | Status: DC

## 2020-11-27 NOTE — Patient Instructions (Signed)
You can use the suppositories twice daily for about one week.   Ensure you are consuming 64 ounces of water daily.  Eat plenty of fiber to keep stools soft.  You will be contacted regarding your referral to GI.  Please let us know if you have not been contacted within two weeks.   It was a pleasure to see you today!   Hemorrhoids Hemorrhoids are swollen veins that may develop:  In the butt (rectum). These are called internal hemorrhoids.  Around the opening of the butt (anus). These are called external hemorrhoids. Hemorrhoids can cause pain, itching, or bleeding. Most of the time, they do not cause serious problems. They usually get better with diet changes, lifestyle changes, and other home treatments. What are the causes? This condition may be caused by:  Having trouble pooping (constipation).  Pushing hard (straining) to poop.  Watery poop (diarrhea).  Pregnancy.  Being very overweight (obese).  Sitting for long periods of time.  Heavy lifting or other activity that causes you to strain.  Anal sex.  Riding a bike for a long period of time. What are the signs or symptoms? Symptoms of this condition include:  Pain.  Itching or soreness in the butt.  Bleeding from the butt.  Leaking poop.  Swelling in the area.  One or more lumps around the opening of your butt. How is this diagnosed? A doctor can often diagnose this condition by looking at the affected area. The doctor may also:  Do an exam that involves feeling the area with a gloved hand (digital rectal exam).  Examine the area inside your butt using a small tube (anoscope).  Order blood tests. This may be done if you have lost a lot of blood.  Have you get a test that involves looking inside the colon using a flexible tube with a camera on the end (sigmoidoscopy or colonoscopy). How is this treated? This condition can usually be treated at home. Your doctor may tell you to change what you eat, make  lifestyle changes, or try home treatments. If these do not help, procedures can be done to remove the hemorrhoids or make them smaller. These may involve:  Placing rubber bands at the base of the hemorrhoids to cut off their blood supply.  Injecting medicine into the hemorrhoids to shrink them.  Shining a type of light energy onto the hemorrhoids to cause them to fall off.  Doing surgery to remove the hemorrhoids or cut off their blood supply. Follow these instructions at home: Eating and drinking  Eat foods that have a lot of fiber in them. These include whole grains, beans, nuts, fruits, and vegetables.  Ask your doctor about taking products that have added fiber (fibersupplements).  Reduce the amount of fat in your diet. You can do this by: ? Eating low-fat dairy products. ? Eating less red meat. ? Avoiding processed foods.  Drink enough fluid to keep your pee (urine) pale yellow.   Managing pain and swelling  Take a warm-water bath (sitz bath) for 20 minutes to ease pain. Do this 3-4 times a day. You may do this in a bathtub or using a portable sitz bath that fits over the toilet.  If told, put ice on the painful area. It may be helpful to use ice between your warm baths. ? Put ice in a plastic bag. ? Place a towel between your skin and the bag. ? Leave the ice on for 20 minutes, 2-3 times a day.  General instructions  Take over-the-counter and prescription medicines only as told by your doctor. ? Medicated creams and medicines may be used as told.  Exercise often. Ask your doctor how much and what kind of exercise is best for you.  Go to the bathroom when you have the urge to poop. Do not wait.  Avoid pushing too hard when you poop.  Keep your butt dry and clean. Use wet toilet paper or moist towelettes after pooping.  Do not sit on the toilet for a long time.  Keep all follow-up visits as told by your doctor. This is important. Contact a doctor if you:  Have  pain and swelling that do not get better with treatment or medicine.  Have trouble pooping.  Cannot poop.  Have pain or swelling outside the area of the hemorrhoids. Get help right away if you have:  Bleeding that will not stop. Summary  Hemorrhoids are swollen veins in the butt or around the opening of the butt.  They can cause pain, itching, or bleeding.  Eat foods that have a lot of fiber in them. These include whole grains, beans, nuts, fruits, and vegetables.  Take a warm-water bath (sitz bath) for 20 minutes to ease pain. Do this 3-4 times a day. This information is not intended to replace advice given to you by your health care provider. Make sure you discuss any questions you have with your health care provider. Document Revised: 09/09/2018 Document Reviewed: 01/21/2018 Elsevier Patient Education  [place].

## 2020-11-27 NOTE — Assessment & Plan Note (Signed)
Noted internally and one to exterior rectum. Uncomplicated but painful on exam.   Rx for Anusol [facility] suppositories sent to pharmacy as this was effective previously. Referral also placed for GI evaluation if no resolve. She agrees.

## 2020-11-27 NOTE — Progress Notes (Signed)
Subjective:    Patient ID: Virginia Bird, female    DOB: February 27, 1981, 40 y.o.   MRN: [id number]  HPI  Virginia Bird is a very pleasant 40 y.o. female with a history of iron deficiency anemia, uterine fibroids, multiple food allergies who presents today to discuss hemorrhoids.   No prior diagnosis, but first noticed hemorroids around October 2021, treated in December 2021 at Urgent Care with Rx suppositories with temporary improvement. Unfortunately she could only afford three RX suppositories due to lack of insurance coverage. Since then she's not noticed resolve, symptoms wax and wane which include painful bowel movements, pain with sitting and is now sitting on a donut pillow.   She denies rectal bleeding and constipation. She's tried OTC suppositories but these are ineffective, she's been using OTC suppositories twice daily since December 2021.   BP Readings from Last 3 Encounters:  11/27/20 120/62  08/21/20 (!) 146/92  08/02/20 (!) 135/96      Review of Systems  Constitutional: Negative for fever.  Gastrointestinal: Negative for abdominal pain, blood in stool, constipation and diarrhea.       Hemorrhoids           Past Medical History:  Diagnosis Date  . Abnormal Pap smear of cervix   . Chickenpox   . UTI (urinary tract infection)     Social History   Socioeconomic History  . Marital status: Single    Spouse name: Not on file  . Number of children: Not on file  . Years of education: Not on file  . Highest education level: Not on file  Occupational History  . Not on file  Tobacco Use  . Smoking status: Current Every Day Smoker    Packs/day: 0.50    Years: 10.00    Pack years: 5.00  . Smokeless tobacco: Never Used  . Tobacco comment: Has not tried any quit aids/tn  Vaping Use  . Vaping Use: Never used  Substance and Sexual Activity  . Alcohol use: Yes    Alcohol/week: 0.0 standard drinks    Comment: weekends  . Drug use: No  . Sexual activity:  Yes    Partners: Male  Other Topics Concern  . Not on file  Social History Narrative   Single.    1 daughter.    Works at [employer].   Enjoys going to [employer].    Social Determinants of Health   Financial Resource Strain: Not on file  Food Insecurity: Not on file  Transportation Needs: Not on file  Physical Activity: Not on file  Stress: Not on file  Social Connections: Not on file  Intimate Partner Violence: Not on file    Past Surgical History:  Procedure Laterality Date  . 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 Thyroid disease Mother   . Hypertension Mother   . Cancer Father        leukemia  . Stroke Father   . Cancer Maternal Aunt        Breast Cancer  . Breast cancer Maternal Aunt   . Asthma Sister   . Miscarriages / Stillbirths Sister     No Known Allergies  Current Outpatient Medications on File Prior to Visit  Medication Sig Dispense Refill  . EPINEPHrine 0.3 mg/0.3 mL IJ SOAJ injection Inject 0.3 mLs (0.3 mg total) into the muscle  as needed for anaphylaxis. 1 each 0  . fluticasone (FLONASE) 50 MCG/ACT nasal spray Place 2 sprays into both nostrils daily. 16 g 6   No current facility-administered medications on file prior to visit.    BP 120/62   Pulse 100   Temp (!) 97.4 F (36.3 C) (Temporal)   Ht 5\' 1"  (1.549 m)   Wt 155 lb (70.3 kg)   SpO2 97%   BMI 29.29 kg/m  Objective:   Physical Exam Abdominal:     Palpations: Abdomen is soft.  Genitourinary:    Rectum: Tenderness, external hemorrhoid and internal hemorrhoid present. Normal anal tone.  Neurological:     Mental Status: She is alert.           Assessment & Plan:      This visit occurred during the SARS-CoV-2 public health emergency.  Safety protocols were in place, including screening questions prior to the visit, additional usage of staff PPE, and extensive  cleaning of exam room while observing appropriate contact time as indicated for disinfecting solutions.

## 2020-12-03 ENCOUNTER — Inpatient Hospital Stay: Payer: BC Managed Care – PPO | Attending: Oncology

## 2020-12-03 ENCOUNTER — Other Ambulatory Visit: Payer: Self-pay

## 2020-12-03 DIAGNOSIS — D509 Iron deficiency anemia, unspecified: Secondary | ICD-10-CM

## 2020-12-03 LAB — CBC WITH DIFFERENTIAL/PLATELET
Abs Immature Granulocytes: 0.02 10*3/uL (ref 0.00–0.07)
Basophils Absolute: 0.1 10*3/uL (ref 0.0–0.1)
Basophils Relative: 1 %
Eosinophils Absolute: 0.3 10*3/uL (ref 0.0–0.5)
Eosinophils Relative: 3 %
HCT: 33.6 % — ABNORMAL LOW (ref 36.0–46.0)
Hemoglobin: 10.4 g/dL — ABNORMAL LOW (ref 12.0–15.0)
Immature Granulocytes: 0 %
Lymphocytes Relative: 26 %
Lymphs Abs: 2.2 10*3/uL (ref 0.7–4.0)
MCH: 23.2 pg — ABNORMAL LOW (ref 26.0–34.0)
MCHC: 31 g/dL (ref 30.0–36.0)
MCV: 75 fL — ABNORMAL LOW (ref 80.0–100.0)
Monocytes Absolute: 0.6 10*3/uL (ref 0.1–1.0)
Monocytes Relative: 7 %
Neutro Abs: 5.4 10*3/uL (ref 1.7–7.7)
Neutrophils Relative %: 63 %
Platelets: 412 10*3/uL — ABNORMAL HIGH (ref 150–400)
RBC: 4.48 MIL/uL (ref 3.87–5.11)
RDW: 15.8 % — ABNORMAL HIGH (ref 11.5–15.5)
WBC: 8.6 10*3/uL (ref 4.0–10.5)
nRBC: 0 % (ref 0.0–0.2)

## 2020-12-03 LAB — IRON AND TIBC
Iron: 24 ug/dL — ABNORMAL LOW (ref 28–170)
Saturation Ratios: 5 % — ABNORMAL LOW (ref 10.4–31.8)
TIBC: 445 ug/dL (ref 250–450)
UIBC: 421 ug/dL

## 2020-12-03 LAB — FERRITIN: Ferritin: 6 ng/mL — ABNORMAL LOW (ref 11–307)

## 2020-12-05 ENCOUNTER — Other Ambulatory Visit: Payer: Self-pay

## 2020-12-05 ENCOUNTER — Inpatient Hospital Stay (HOSPITAL_BASED_OUTPATIENT_CLINIC_OR_DEPARTMENT_OTHER): Payer: BC Managed Care – PPO | Admitting: Oncology

## 2020-12-05 DIAGNOSIS — D75838 Other thrombocytosis: Secondary | ICD-10-CM

## 2020-12-05 DIAGNOSIS — D500 Iron deficiency anemia secondary to blood loss (chronic): Secondary | ICD-10-CM | POA: Diagnosis not present

## 2020-12-05 DIAGNOSIS — N920 Excessive and frequent menstruation with regular cycle: Secondary | ICD-10-CM | POA: Diagnosis not present

## 2020-12-05 DIAGNOSIS — D259 Leiomyoma of uterus, unspecified: Secondary | ICD-10-CM

## 2020-12-05 DIAGNOSIS — D509 Iron deficiency anemia, unspecified: Secondary | ICD-10-CM

## 2020-12-05 NOTE — Progress Notes (Signed)
[place]  [phone] [phone]  ID: Virginia Bird OB: May 23, 1981  MR#: [id number]  OEC#:[id number]  Patient Care Team: Pleas Koch, NP as PCP - General (Internal Medicine) Osborne Oman, MD as Attending Physician (Obstetrics and Gynecology)  CHIEF COMPLAINT: Iron deficiency anemia.  I connected with Virginia Bird on 12/06/20 at  2:30 PM EDT by video enabled telemedicine visit and verified that I am speaking with the correct person using two identifiers.   I discussed the limitations, risks, security and privacy concerns of performing an evaluation and management service by telemedicine and the availability of in-person appointments. I also discussed with the patient that there may be a patient responsible charge related to this service. The patient expressed understanding and agreed to proceed.    Patient's location: Work   Provider's location: Clinic     INTERVAL HISTORY: Patient returns to clinic today for repeat laboratory work, further evaluation, and consideration of additional IV Feraheme.  She was last seen in clinic on 08/02/2020.   She last received IV Feraheme on 02/16/2020.  Today, she continues to feel well with mild fatigue.  She denies any weakness.  Her menstrual cycles have been fairly stable denies any heavy bleeding.  She denies any GI bleeding.  She is taking OTC iron supplements.    REVIEW OF SYSTEMS:   Review of Systems  Constitutional: Positive for malaise/fatigue. Negative for fever and weight loss.  Respiratory: Negative.  Negative for cough and shortness of breath.   Cardiovascular: Negative.  Negative for chest pain and leg swelling.  Gastrointestinal: Negative.  Negative for abdominal pain, blood in stool and melena.  Genitourinary: Negative.  Negative for hematuria.  Musculoskeletal: Negative.  Negative for back pain.  Skin: Negative.  Negative for rash.  Neurological: Negative.  Negative  for dizziness, focal weakness, weakness and headaches.  Psychiatric/Behavioral: Negative.  The patient is not nervous/anxious.     As per HPI. Otherwise, a complete review of systems is negative.  PAST MEDICAL HISTORY: Past Medical History:  Diagnosis Date  . Abnormal Pap smear of cervix   . Chickenpox   . UTI (urinary tract infection)     PAST SURGICAL HISTORY: Past Surgical History:  Procedure Laterality Date  . 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 Thyroid disease Mother   . Hypertension Mother   . Cancer Father        leukemia  . Stroke Father   . Cancer Maternal Aunt        Breast Cancer  . Breast cancer Maternal Aunt   . Asthma Sister   . Miscarriages / [place] Sister     ADVANCED DIRECTIVES (Y/N):  N  HEALTH MAINTENANCE: Social History   Tobacco Use  . Smoking status: Current Every Day Smoker    Packs/day: 0.50    Years: 10.00    Pack years: 5.00  . Smokeless tobacco: Never Used  . Tobacco comment: Has not tried any quit aids/tn  Vaping Use  . Vaping Use: Never used  Substance Use Topics  . Alcohol use: Yes    Alcohol/week: 0.0 standard drinks    Comment: weekends  . Drug use: No     Colonoscopy:  PAP:  Bone density:  Lipid panel:  No Known Allergies  Current Outpatient Medications  Medication Sig Dispense Refill  .  EPINEPHrine 0.3 mg/0.3 mL IJ SOAJ injection Inject 0.3 mLs (0.3 mg total) into the muscle as needed for anaphylaxis. 1 each 0  . fluticasone (FLONASE) 50 MCG/ACT nasal spray Place 2 sprays into both nostrils daily. 16 g 6  . hydrocortisone (ANUSOL-HC) 25 MG suppository Place 1 suppository (25 mg total) rectally 2 (two) times daily. 12 suppository 0   No current facility-administered medications for this visit.    OBJECTIVE: There were no vitals filed for this visit.   There is no height or weight  on file to calculate BMI.    ECOG FS:0 - Asymptomatic  Physical Exam Constitutional:      Appearance: Normal appearance.  Neurological:     Mental Status: She is alert and oriented to person, place, and time.    LAB RESULTS:  Lab Results  Component Value Date   NA 138 11/08/2019   K 3.5 11/08/2019   CL 102 11/08/2019   CO2 29 11/08/2019   GLUCOSE 92 11/08/2019   BUN 7 11/08/2019   CREATININE 0.72 11/08/2019   CALCIUM 8.9 11/08/2019   PROT 7.0 11/08/2019   ALBUMIN 3.6 11/08/2019   AST 13 11/08/2019   ALT 8 11/08/2019   ALKPHOS 56 11/08/2019   BILITOT 0.4 11/08/2019   GFRNONAA >60 04/11/2009   GFRAA  04/11/2009    >60        The eGFR has been calculated using the MDRD equation. This calculation has not been validated in all clinical situations. eGFR's persistently <60 mL/min signify possible Chronic Kidney Disease.    Lab Results  Component Value Date   WBC 8.6 12/03/2020   NEUTROABS 5.4 12/03/2020   HGB 10.4 (L) 12/03/2020   HCT 33.6 (L) 12/03/2020   MCV 75.0 (L) 12/03/2020   PLT 412 (H) 12/03/2020   Lab Results  Component Value Date   IRON 24 (L) 12/03/2020   TIBC 445 12/03/2020   IRONPCTSAT 5 (L) 12/03/2020   Lab Results  Component Value Date   FERRITIN 6 (L) 12/03/2020     STUDIES: No results found.  ASSESSMENT: Iron deficiency anemia.  PLAN:    1. Iron deficiency anemia: -To be secondary to heavy menstrual bleeding from fibroids. -Menstrual cycles appear to be stable. -She denies any GI bleeding.  Has never had a colonoscopy.  -She last received IV Feraheme on 02/16/2020. -Labs from 12/03/2020 show a hemoglobin of 10.4 (11.6), ferritin of 6 (22) and iron saturations 5% (14%).  -Recommend 2 doses of IV Feraheme.  -Recommend she continue OTC iron supplements.  -If hemoglobin/iron studies continue to decline, patient may benefit from colonoscopy/EGD if menstrual cycles continue to stabilize.   2.  Fibroids:  -Continue follow-up with OB/GYN  as scheduled.  3.  Thrombocytosis: -Secondary to IDA. -Platelet count 412 (338).   Disposition: -Patient to get scheduled for 2 doses of IV Feraheme. -Continue oral iron supplements -RTC in 3 to 4 months with repeat labs, MD assessment and possible IV iron.  I provided 15 minutes of face-to-face video visit time during this encounter, and > 50% was spent counseling as documented under my assessment & plan..  Patient expressed understanding and was in agreement with this plan. She also understands that She can call clinic at any time with any questions, concerns, or complaints.    Jacquelin Hawking, NP   12/05/2020 2:21 PM

## 2020-12-06 ENCOUNTER — Encounter: Payer: Self-pay | Admitting: Oncology

## 2020-12-06 ENCOUNTER — Other Ambulatory Visit: Payer: Self-pay | Admitting: Oncology

## 2020-12-13 ENCOUNTER — Inpatient Hospital Stay: Payer: BC Managed Care – PPO

## 2020-12-13 ENCOUNTER — Other Ambulatory Visit: Payer: Self-pay

## 2020-12-13 VITALS — BP 110/72 | HR 87 | Temp 96.9°F | Resp 20

## 2020-12-13 DIAGNOSIS — D509 Iron deficiency anemia, unspecified: Secondary | ICD-10-CM

## 2020-12-13 MED ORDER — IRON SUCROSE 20 MG/ML IV SOLN
200.0000 mg | Freq: Once | INTRAVENOUS | Status: AC
  Administered 2020-12-13: 200 mg via INTRAVENOUS
  Filled 2020-12-13: qty 10

## 2020-12-13 MED ORDER — SODIUM CHLORIDE 0.9 % IV SOLN
Freq: Once | INTRAVENOUS | Status: AC
  Filled 2020-12-13: qty 250

## 2020-12-13 MED ORDER — SODIUM CHLORIDE 0.9 % IV SOLN: 200.0000 mg | Freq: Once | INTRAVENOUS | Status: DC

## 2020-12-13 NOTE — Progress Notes (Signed)
Patient here for venofer. Has received feraheme in the past. Tolerated venofer well without any issues. Had patient stay for 30 minutes post infusion since this was her first time receiving venofer. VS stable at discharge.

## 2020-12-19 ENCOUNTER — Ambulatory Visit
Admission: RE | Admit: 2020-12-19 | Discharge: 2020-12-19 | Disposition: A | Discharge status: Home / Self Care | Payer: BC Managed Care – PPO | Source: Ambulatory Visit | Attending: Primary Care | Admitting: Primary Care

## 2020-12-19 ENCOUNTER — Other Ambulatory Visit: Payer: Self-pay

## 2020-12-19 DIAGNOSIS — Z1231 Encounter for screening mammogram for malignant neoplasm of breast: Secondary | ICD-10-CM | POA: Insufficient documentation

## 2020-12-20 ENCOUNTER — Inpatient Hospital Stay: Payer: BC Managed Care – PPO | Attending: Oncology

## 2020-12-20 DIAGNOSIS — D509 Iron deficiency anemia, unspecified: Secondary | ICD-10-CM | POA: Insufficient documentation

## 2020-12-20 MED ORDER — SODIUM CHLORIDE 0.9 % IV SOLN: 200.0000 mg | Freq: Once | INTRAVENOUS | Status: DC

## 2020-12-20 MED ORDER — SODIUM CHLORIDE 0.9 % IV SOLN
Freq: Once | INTRAVENOUS | Status: AC
  Filled 2020-12-20: qty 250

## 2020-12-20 MED ORDER — IRON SUCROSE 20 MG/ML IV SOLN
200.0000 mg | Freq: Once | INTRAVENOUS | Status: AC
  Administered 2020-12-20: 200 mg via INTRAVENOUS
  Filled 2020-12-20: qty 10

## 2020-12-27 ENCOUNTER — Inpatient Hospital Stay: Payer: BC Managed Care – PPO

## 2020-12-27 VITALS — BP 112/82 | HR 78 | Temp 96.9°F | Resp 18

## 2020-12-27 DIAGNOSIS — D509 Iron deficiency anemia, unspecified: Secondary | ICD-10-CM | POA: Diagnosis not present

## 2020-12-27 MED ORDER — SODIUM CHLORIDE 0.9 % IV SOLN
Freq: Once | INTRAVENOUS | Status: AC
Start: 2020-12-27 — End: 2020-12-27
  Filled 2020-12-27: qty 250

## 2020-12-27 MED ORDER — SODIUM CHLORIDE 0.9 % IV SOLN: 200.0000 mg | Freq: Once | INTRAVENOUS | Status: DC

## 2020-12-27 MED ORDER — IRON SUCROSE 20 MG/ML IV SOLN
200.0000 mg | Freq: Once | INTRAVENOUS | Status: AC
  Administered 2020-12-27: 200 mg via INTRAVENOUS
  Filled 2020-12-27: qty 10

## 2021-01-03 ENCOUNTER — Inpatient Hospital Stay: Payer: BC Managed Care – PPO

## 2021-01-03 VITALS — BP 115/77 | HR 90 | Temp 97.0°F

## 2021-01-03 DIAGNOSIS — D509 Iron deficiency anemia, unspecified: Secondary | ICD-10-CM

## 2021-01-03 MED ORDER — SODIUM CHLORIDE 0.9 % IV SOLN
Freq: Once | INTRAVENOUS | Status: AC
Start: 2021-01-03 — End: 2021-01-03
  Filled 2021-01-03: qty 250

## 2021-01-03 MED ORDER — IRON SUCROSE 20 MG/ML IV SOLN
200.0000 mg | Freq: Once | INTRAVENOUS | Status: AC
  Administered 2021-01-03: 200 mg via INTRAVENOUS
  Filled 2021-01-03: qty 10

## 2021-01-03 MED ORDER — SODIUM CHLORIDE 0.9 % IV SOLN: 200.0000 mg | Freq: Once | INTRAVENOUS | Status: DC

## 2021-01-11 ENCOUNTER — Telehealth: Payer: Self-pay | Admitting: Primary Care

## 2021-01-11 DIAGNOSIS — K649 Unspecified hemorrhoids: Secondary | ICD-10-CM

## 2021-01-11 MED ORDER — HYDROCORTISONE ACETATE 25 MG RE SUPP: 25.0000 mg | Freq: Two times a day (BID) | RECTAL | 0 refills | Status: DC

## 2021-01-11 NOTE — Telephone Encounter (Signed)
Noted, refill provided 

## 2021-01-11 NOTE — Telephone Encounter (Signed)
She was not seen by GI ok to send refill?

## 2021-01-11 NOTE — Telephone Encounter (Signed)
She wanted to know if she can get a prescription for her hemmoroids,

## 2021-01-11 NOTE — Telephone Encounter (Signed)
Called patient to see if she wants refill of script that Brigid Re at last visit. Also see if she ever made app with GI.

## 2021-01-11 NOTE — Telephone Encounter (Signed)
Pt called back, she does want the medication from before.

## 2021-01-29 ENCOUNTER — Ambulatory Visit: Payer: BC Managed Care – PPO | Admitting: Obstetrics & Gynecology

## 2021-03-05 ENCOUNTER — Encounter: Payer: Self-pay | Admitting: Primary Care

## 2021-03-05 ENCOUNTER — Ambulatory Visit (INDEPENDENT_AMBULATORY_CARE_PROVIDER_SITE_OTHER): Payer: BC Managed Care – PPO | Admitting: Primary Care

## 2021-03-05 ENCOUNTER — Other Ambulatory Visit: Payer: Self-pay

## 2021-03-05 VITALS — BP 110/62 | HR 101 | Temp 97.6°F | Ht 61.0 in | Wt 155.0 lb

## 2021-03-05 DIAGNOSIS — Z0000 Encounter for general adult medical examination without abnormal findings: Secondary | ICD-10-CM

## 2021-03-05 DIAGNOSIS — K649 Unspecified hemorrhoids: Secondary | ICD-10-CM | POA: Diagnosis not present

## 2021-03-05 DIAGNOSIS — Z91018 Allergy to other foods: Secondary | ICD-10-CM

## 2021-03-05 DIAGNOSIS — J3089 Other allergic rhinitis: Secondary | ICD-10-CM

## 2021-03-05 DIAGNOSIS — D219 Benign neoplasm of connective and other soft tissue, unspecified: Secondary | ICD-10-CM | POA: Diagnosis not present

## 2021-03-05 DIAGNOSIS — D509 Iron deficiency anemia, unspecified: Secondary | ICD-10-CM

## 2021-03-05 LAB — LIPID PANEL
Cholesterol: 111 mg/dL (ref 0–200)
HDL: 50.4 mg/dL (ref >39.00)
LDL Cholesterol: 43 mg/dL (ref 0–99)
NonHDL: 60.55
Total CHOL/HDL Ratio: 2
Triglycerides: 90 mg/dL (ref 0.0–149.0)
VLDL: 18 mg/dL (ref 0.0–40.0)

## 2021-03-05 LAB — COMPREHENSIVE METABOLIC PANEL
ALT: 10 U/L (ref 0–35)
AST: 14 U/L (ref 0–37)
Albumin: 3.7 g/dL (ref 3.5–5.2)
Alkaline Phosphatase: 47 U/L (ref 39–117)
BUN: 7 mg/dL (ref 6–23)
CO2: 25 mEq/L (ref 19–32)
Calcium: 8.6 mg/dL (ref 8.4–10.5)
Chloride: 106 mEq/L (ref 96–112)
Creatinine, Ser: 0.7 mg/dL (ref 0.40–1.20)
GFR: 107.08 mL/min (ref >60.00)
Glucose, Bld: 88 mg/dL (ref 70–99)
Potassium: 3.5 mEq/L (ref 3.5–5.1)
Sodium: 140 mEq/L (ref 135–145)
Total Bilirubin: 0.2 mg/dL (ref 0.2–1.2)
Total Protein: 7.2 g/dL (ref 6.0–8.3)

## 2021-03-05 MED ORDER — EPINEPHRINE 0.3 MG/0.3ML IJ SOAJ: 0.3000 mg | INTRAMUSCULAR | 0 refills | Status: DC | PRN

## 2021-03-05 MED ORDER — HYDROCORTISONE ACETATE 25 MG RE SUPP: 25.0000 mg | Freq: Two times a day (BID) | RECTAL | 0 refills | Status: DC

## 2021-03-05 NOTE — Assessment & Plan Note (Signed)
Chronic and continued, has to hear from GI. Will call GI office today to get her set up.  Refill provided for suppositories.

## 2021-03-05 NOTE — Patient Instructions (Addendum)
Stop by the lab prior to leaving today. I will notify you of your results once received.   Please discuss your bleeding and iron deficiency anemia with your gynecologist.   It was a pleasure to see you today!  [facility] Phone number :  [phone]. If you have any issues send our office a my chart and I will be happy to follow up on it for you.   Preventive Care 20-42 Years Old, Female Preventive care refers to lifestyle choices and visits with your health care provider that can promote health and wellness. This includes: A yearly physical exam. This is also called an annual wellness visit. Regular dental and eye exams. Immunizations. Screening for certain conditions. Healthy lifestyle choices, such as: Eating a healthy diet. Getting regular exercise. Not using drugs or products that contain nicotine and tobacco. Limiting alcohol use. What can I expect for my preventive care visit? Physical exam Your health care provider will check your: Height and weight. These may be used to calculate your BMI (body mass index). BMI is a measurement that tells if you are at a healthy weight. Heart rate and blood pressure. Body temperature. Skin for abnormal spots. Counseling Your health care provider may ask you questions about your: Past medical problems. Family's medical history. Alcohol, tobacco, and drug use. Emotional well-being. Home life and relationship well-being. Sexual activity. Diet, exercise, and sleep habits. Work and work Statistician. Access to firearms. Method of birth control. Menstrual cycle. Pregnancy history. What immunizations do I need?  Vaccines are usually given at various ages, according to a schedule. Your health care provider will recommend vaccines for you based on your age, medicalhistory, and lifestyle or other factors, such as travel or where you work. What tests do I need? Blood tests Lipid and cholesterol levels. These may be checked every 5 years,  or more often if you are over 78 years old. Hepatitis C test. Hepatitis B test. Screening Lung cancer screening. You may have this screening every year starting at age 82 if you have a 30-pack-year history of smoking and currently smoke or have quit within the past 15 years. Colorectal cancer screening. All adults should have this screening starting at age 26 and continuing until age 60. Your health care provider may recommend screening at age 68 if you are at increased risk. You will have tests every 1-10 years, depending on your results and the type of screening test. Diabetes screening. This is done by checking your blood sugar (glucose) after you have not eaten for a while (fasting). You may have this done every 1-3 years. Mammogram. This may be done every 1-2 years. Talk with your health care provider about when you should start having regular mammograms. This may depend on whether you have a family history of breast cancer. BRCA-related cancer screening. This may be done if you have a family history of breast, ovarian, tubal, or peritoneal cancers. Pelvic exam and Pap test. This may be done every 3 years starting at age 25. Starting at age 98, this may be done every 5 years if you have a Pap test in combination with an HPV test. Other tests STD (sexually transmitted disease) testing, if you are at risk. Bone density scan. This is done to screen for osteoporosis. You may have this scan if you are at high risk for osteoporosis. Talk with your health care provider about your test results, treatment options,and if necessary, the need for more tests. Follow these instructions at home: Eating  and drinking  Eat a diet that includes fresh fruits and vegetables, whole grains, lean protein, and low-fat dairy products. Take vitamin and mineral supplements as recommended by your health care provider. Do not drink alcohol if: Your health care provider tells you not to drink. You are pregnant,  may be pregnant, or are planning to become pregnant. If you drink alcohol: Limit how much you have to 0-1 drink a day. Be aware of how much alcohol is in your drink. In the [place] one drink equals one 12 oz bottle of beer (355 mL), one 5 oz glass of wine (148 mL), or one 1 oz glass of hard liquor (44 mL).  Lifestyle Take daily care of your teeth and gums. Brush your teeth every morning and night with fluoride toothpaste. Floss one time each day. Stay active. Exercise for at least 30 minutes 5 or more days each week. Do not use any products that contain nicotine or tobacco, such as cigarettes, e-cigarettes, and chewing tobacco. If you need help quitting, ask your health care provider. Do not use drugs. If you are sexually active, practice safe sex. Use a condom or other form of protection to prevent STIs (sexually transmitted infections). If you do not wish to become pregnant, use a form of birth control. If you plan to become pregnant, see your health care provider for a prepregnancy visit. If told by your health care provider, take low-dose aspirin daily starting at age 85. Find healthy ways to cope with stress, such as: Meditation, yoga, or listening to music. Journaling. Talking to a trusted person. Spending time with friends and family. Safety Always wear your seat belt while driving or riding in a vehicle. Do not drive: If you have been drinking alcohol. Do not ride with someone who has been drinking. When you are tired or distracted. While texting. Wear a helmet and other protective equipment during sports activities. If you have firearms in your house, make sure you follow all gun safety procedures. What's next? Visit your health care provider once a year for an annual wellness visit. Ask your health care provider how often you should have your eyes and teeth checked. Stay up to date on all vaccines. This information is not intended to replace advice given to you by your health  care provider. Make sure you discuss any questions you have with your healthcare provider. Document Revised: 06/05/2020 Document Reviewed: 05/13/2018 Elsevier Patient Education  2022 [employer].

## 2021-03-05 NOTE — Assessment & Plan Note (Signed)
Declines Tetanus vaccine. Pap smear UTD. Mammogram UTD.  Discussed the importance of a healthy diet and regular exercise in order for weight loss, and to reduce the risk of further co-morbidity.  Exam today stable. Labs pending.

## 2021-03-05 NOTE — Assessment & Plan Note (Signed)
Refill provided for expired EpiPen.

## 2021-03-05 NOTE — Assessment & Plan Note (Signed)
Secondary to abnormal uterine bleeding. She does have known uterine fibroids which are likely causing the majority of her symptoms.  Follows with hematology, has received numerous infusions. I strongly advised she notify her GYN of these significant symptoms and continued anemia. She agrees, has an appointment next week.

## 2021-03-05 NOTE — Assessment & Plan Note (Signed)
Following with GYN, continues to notice abnormal uterine bleeding. Has received numerous iron infusions.   She has appointment with GYN scheduled for next week and will update.

## 2021-03-05 NOTE — Assessment & Plan Note (Signed)
Doing well on Flonase. Continue same. Refill provided for EpiPen.

## 2021-03-05 NOTE — Progress Notes (Signed)
Subjective:    Patient ID: Virginia Bird, female    DOB: 23-Aug-1981, 40 y.o.   MRN: [id number]  HPI  Virginia Bird is a very pleasant 40 y.o. female who presents today for complete physical.  Immunizations: -Tetanus: Unsure, over 10 years.  -Influenza: Due this season  -Covid-19: Has not completed   Diet: Fair diet.  Exercise: No regular exercise.  Eye exam: Completes annually  Dental exam: Completes semi-annually   Pap Smear: Completed in 2021 Mammogram: Completed in April 2022  BP Readings from Last 3 Encounters:  03/05/21 110/62  01/03/21 115/77  12/27/20 112/82         Review of Systems  Constitutional:  Positive for fatigue. Negative for unexpected weight change.  HENT:  Negative for rhinorrhea.   Respiratory:  Negative for cough and shortness of breath.   Cardiovascular:  Negative for chest pain.  Gastrointestinal:  Negative for constipation and diarrhea.  Genitourinary:  Positive for menstrual problem. Negative for difficulty urinating.       Abnormal uterine bleeding  Musculoskeletal:  Negative for arthralgias and myalgias.  Skin:  Negative for rash.  Allergic/Immunologic: Positive for environmental allergies.  Neurological:  Negative for dizziness and headaches.  Psychiatric/Behavioral:  The patient is not nervous/anxious.         Past Medical History:  Diagnosis Date   Abnormal Pap smear of cervix    Chickenpox    UTI (urinary tract infection)     Social History   Socioeconomic History   Marital status: Single    Spouse name: Not on file   Number of children: Not on file   Years of education: Not on file   Highest education level: Not on file  Occupational History   Not on file  Tobacco Use   Smoking status: Every Day    Packs/day: 0.50    Years: 10.00    Pack years: 5.00    Types: Cigarettes   Smokeless tobacco: Never   Tobacco comments:    Has not tried any quit aids/tn  Vaping Use   Vaping Use: Never used  Substance  and Sexual Activity   Alcohol use: Yes    Alcohol/week: 0.0 standard drinks    Comment: weekends   Drug use: No   Sexual activity: Yes    Partners: Male  Other Topics Concern   Not on file  Social History Narrative   Single.    1 daughter.    Works at [employer].   Enjoys going to [employer].    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 1 each 0   fluticasone (FLONASE) 50 MCG/ACT nasal spray Place 2 sprays into both nostrils daily. 16 g 6   No current facility-administered medications on file prior to visit.    BP 110/62   Pulse (!) 101   Temp 97.6 F (36.4 C) (Temporal)   Ht 5\' 1"  (1.549 m)   Wt 155 lb (70.3 kg)   SpO2 98%   BMI 29.29 kg/m  Objective:   Physical Exam HENT:     Right Ear: Tympanic membrane and ear canal normal.     Left Ear: Tympanic membrane and ear canal normal.     Nose: Nose normal.  Eyes:     Conjunctiva/sclera: Conjunctivae normal.     Pupils: Pupils are equal, round, and reactive to light.  Neck:     Thyroid: No thyromegaly.  Cardiovascular:     Rate and  Rhythm: Normal rate and regular rhythm.     Heart sounds: No murmur heard. Pulmonary:     Effort: Pulmonary effort is normal.     Breath sounds: Normal breath sounds. No rales.  Abdominal:     General: Bowel sounds are normal.     Palpations: Abdomen is soft.     Tenderness: There is no abdominal tenderness.  Musculoskeletal:        General: Normal range of motion.     Cervical back: Neck supple.  Lymphadenopathy:     Cervical: No cervical adenopathy.  Skin:    General: Skin is warm and dry.     Findings: No rash.  Neurological:     Mental Status: She is alert and oriented to person, place, and time.     Cranial Nerves: No cranial nerve deficit.     Deep Tendon Reflexes: Reflexes are normal and symmetric.  Psychiatric:        Mood and Affect: Mood normal.          Assessment & Plan:      This visit occurred during the SARS-CoV-2 public health emergency.  Safety protocols were in place, including screening questions prior to the visit, additional usage of staff PPE, and extensive cleaning of exam room while observing appropriate contact time as indicated for disinfecting solutions.

## 2021-03-06 ENCOUNTER — Telehealth: Payer: Self-pay | Admitting: Primary Care

## 2021-03-06 DIAGNOSIS — K649 Unspecified hemorrhoids: Secondary | ICD-10-CM

## 2021-03-06 MED ORDER — HYDROCORTISONE ACETATE 25 MG RE SUPP: 25.0000 mg | Freq: Two times a day (BID) | RECTAL | 0 refills | Status: DC

## 2021-03-06 NOTE — Telephone Encounter (Signed)
No problem, Rx sent to [facility] as requested.

## 2021-03-06 NOTE — Telephone Encounter (Signed)
Mrs. Luhrs called in wanted to know if she can get a prescription for her suppository to the [address]. They told her that she would need to get a prescription for it and it would cost her 11$.

## 2021-03-07 NOTE — Telephone Encounter (Signed)
Called patient has picked up meds. No other questions.

## 2021-03-12 ENCOUNTER — Ambulatory Visit: Payer: BC Managed Care – PPO | Admitting: Obstetrics & Gynecology

## 2021-04-02 NOTE — Progress Notes (Signed)
[place]  [phone] [phone]  ID: Virginia Bird OB: 06-15-1981  MR#: [id number]  DJM#:[id number]  Patient Care Team: Pleas Koch, NP as PCP - General (Internal Medicine) Osborne Oman, MD as Attending Physician (Obstetrics and Gynecology)  CHIEF COMPLAINT: Iron deficiency anemia.  INTERVAL HISTORY: Patient returns to clinic today for repeat laboratory work, further evaluation, and consideration of additional IV Venofer.  She continues to feel well and remains asymptomatic.  She does not complain of any weakness or fatigue today.  She has no neurologic complaints.  She denies any recent fevers or illnesses.  She has a good appetite and denies weight loss.  She has no chest pain, shortness of breath, cough, or hemoptysis.  She denies any nausea, vomiting, constipation, or diarrhea.  She has no melena or hematochezia.  She has no urinary complaints.  Patient offers no specific complaints today.  REVIEW OF SYSTEMS:   Review of Systems  Constitutional: Negative.  Negative for fever, malaise/fatigue and weight loss.  Respiratory: Negative.  Negative for cough and shortness of breath.   Cardiovascular: Negative.  Negative for chest pain and leg swelling.  Gastrointestinal: Negative.  Negative for abdominal pain, blood in stool and melena.  Genitourinary: Negative.  Negative for hematuria.  Musculoskeletal: Negative.  Negative for back pain.  Skin: Negative.  Negative for rash.  Neurological: Negative.  Negative for dizziness, focal weakness, weakness and headaches.  Psychiatric/Behavioral: Negative.  The patient is not nervous/anxious.    As per HPI. Otherwise, a complete review of systems is negative.  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    FAMILY HISTORY: Family History  Problem Relation Age of Onset   Thyroid disease Mother    Hypertension Mother    Cancer Father        leukemia   Stroke Father    Cancer Maternal Aunt        Breast Cancer   Breast cancer Maternal Aunt    Asthma Sister    Miscarriages / Stillbirths Sister     ADVANCED DIRECTIVES (Y/N):  N  HEALTH MAINTENANCE: Social History   Tobacco Use   Smoking status: Every Day    Packs/day: 0.50    Years: 10.00    Pack years: 5.00    Types: Cigarettes   Smokeless tobacco: Never   Tobacco comments:    Has not tried any quit aids/tn  Vaping Use   Vaping Use: Never used  Substance Use Topics   Alcohol use: Yes    Alcohol/week: 0.0 standard drinks    Comment: weekends   Drug use: No     Colonoscopy:  PAP:  Bone density:  Lipid panel:  No Known Allergies  Current Outpatient Medications  Medication Sig Dispense Refill   fluticasone (FLONASE) 50 MCG/ACT nasal spray Place 2 sprays into both nostrils daily. 16 g 6   EPINEPHrine 0.3 mg/0.3 mL IJ SOAJ injection Inject 0.3 mg into the muscle as needed for anaphylaxis. (Patient not taking: Reported on 04/04/2021) 1 each 0   hydrocortisone (ANUSOL-HC) 25 MG suppository Place 1 suppository (25 mg total) rectally 2 (two) times daily. (Patient not taking: Reported on 04/04/2021)  12 suppository 0   No current facility-administered medications for this visit.    OBJECTIVE: Vitals:   04/04/21 1335  BP: 127/77  Pulse: (!) 104  Temp: 98.4 F (36.9 C)  SpO2: 100%     Body mass index is 29.48 kg/m.    ECOG FS:0 - Asymptomatic  General: Well-developed, well-nourished, no acute distress. Eyes: Pink conjunctiva, anicteric sclera. HEENT: Normocephalic, moist mucous membranes. Lungs: No audible wheezing or coughing. Heart: Regular rate and rhythm. Abdomen: Soft, nontender, no obvious distention. Musculoskeletal:  No edema, cyanosis, or clubbing. Neuro: Alert, answering all questions appropriately. Cranial nerves grossly intact. Skin: No rashes or petechiae noted. Psych: Normal affect.   LAB RESULTS:  Lab Results  Component Value Date   NA 140 03/05/2021   K 3.5 03/05/2021   CL 106 03/05/2021   CO2 25 03/05/2021   GLUCOSE 88 03/05/2021   BUN 7 03/05/2021   CREATININE 0.70 03/05/2021   CALCIUM 8.6 03/05/2021   PROT 7.2 03/05/2021   ALBUMIN 3.7 03/05/2021   AST 14 03/05/2021   ALT 10 03/05/2021   ALKPHOS 47 03/05/2021   BILITOT 0.2 03/05/2021   GFRNONAA >60 04/11/2009   GFRAA  04/11/2009    >60        The eGFR has been calculated using the MDRD equation. This calculation has not been validated in all clinical situations. eGFR's persistently <60 mL/min signify possible Chronic Kidney Disease.    Lab Results  Component Value Date   WBC 7.9 04/04/2021   NEUTROABS 4.9 04/04/2021   HGB 11.4 (L) 04/04/2021   HCT 35.3 (L) 04/04/2021   MCV 83.1 04/04/2021   PLT 365 04/04/2021   Lab Results  Component Value Date   IRON 30 04/04/2021   TIBC 349 04/04/2021   IRONPCTSAT 9 (L) 04/04/2021   Lab Results  Component Value Date   FERRITIN 19 04/04/2021     STUDIES: No results found.  ASSESSMENT: Iron deficiency anemia.  PLAN:    1. Iron deficiency anemia: Likely secondary to fibroids and heavy menstrual bleeding.  Patient's hemoglobin remains mildly decreased along with her iron stores.  She does not wish to have additional IV Venofer today, but agreed she will likely need an infusion at her next visit.  She last received treatment on January 03, 2021.  Return to clinic in 4 months with repeat laboratory work and further evaluation.      2.  Fibroids: Continue follow-up with OB/GYN as scheduled.  I spent a total of 20 minutes reviewing chart data, face-to-face evaluation with the patient, counseling and coordination of care as detailed above.   Patient expressed understanding  and was in agreement with this plan. She also understands that She can call clinic at any time with any questions, concerns, or complaints.    Lloyd Huger, MD   04/08/2021 10:20 AM

## 2021-04-04 ENCOUNTER — Inpatient Hospital Stay: Payer: BC Managed Care – PPO | Attending: Oncology

## 2021-04-04 ENCOUNTER — Inpatient Hospital Stay (HOSPITAL_BASED_OUTPATIENT_CLINIC_OR_DEPARTMENT_OTHER): Payer: BC Managed Care – PPO | Admitting: Oncology

## 2021-04-04 ENCOUNTER — Other Ambulatory Visit: Payer: Self-pay

## 2021-04-04 ENCOUNTER — Encounter: Payer: Self-pay | Admitting: Oncology

## 2021-04-04 ENCOUNTER — Inpatient Hospital Stay: Payer: BC Managed Care – PPO

## 2021-04-04 VITALS — BP 127/77 | HR 104 | Temp 98.4°F | Wt 156.0 lb

## 2021-04-04 DIAGNOSIS — D259 Leiomyoma of uterus, unspecified: Secondary | ICD-10-CM | POA: Diagnosis not present

## 2021-04-04 DIAGNOSIS — F17210 Nicotine dependence, cigarettes, uncomplicated: Secondary | ICD-10-CM | POA: Insufficient documentation

## 2021-04-04 DIAGNOSIS — D509 Iron deficiency anemia, unspecified: Secondary | ICD-10-CM | POA: Diagnosis not present

## 2021-04-04 LAB — CBC WITH DIFFERENTIAL/PLATELET
Abs Immature Granulocytes: 0.03 10*3/uL (ref 0.00–0.07)
Basophils Absolute: 0.1 10*3/uL (ref 0.0–0.1)
Basophils Relative: 1 %
Eosinophils Absolute: 0.3 10*3/uL (ref 0.0–0.5)
Eosinophils Relative: 4 %
HCT: 35.3 % — ABNORMAL LOW (ref 36.0–46.0)
Hemoglobin: 11.4 g/dL — ABNORMAL LOW (ref 12.0–15.0)
Immature Granulocytes: 0 %
Lymphocytes Relative: 27 %
Lymphs Abs: 2.2 10*3/uL (ref 0.7–4.0)
MCH: 26.8 pg (ref 26.0–34.0)
MCHC: 32.3 g/dL (ref 30.0–36.0)
MCV: 83.1 fL (ref 80.0–100.0)
Monocytes Absolute: 0.5 10*3/uL (ref 0.1–1.0)
Monocytes Relative: 6 %
Neutro Abs: 4.9 10*3/uL (ref 1.7–7.7)
Neutrophils Relative %: 62 %
Platelets: 365 10*3/uL (ref 150–400)
RBC: 4.25 MIL/uL (ref 3.87–5.11)
RDW: 13.8 % (ref 11.5–15.5)
WBC: 7.9 10*3/uL (ref 4.0–10.5)
nRBC: 0 % (ref 0.0–0.2)

## 2021-04-04 LAB — FERRITIN: Ferritin: 19 ng/mL (ref 11–307)

## 2021-04-04 LAB — IRON AND TIBC
Iron: 30 ug/dL (ref 28–170)
Saturation Ratios: 9 % — ABNORMAL LOW (ref 10.4–31.8)
TIBC: 349 ug/dL (ref 250–450)
UIBC: 319 ug/dL

## 2021-04-04 NOTE — Progress Notes (Signed)
No new concerns today 

## 2021-04-08 ENCOUNTER — Encounter: Payer: Self-pay | Admitting: Oncology

## 2021-05-03 ENCOUNTER — Telehealth: Payer: Self-pay | Admitting: Primary Care

## 2021-05-03 ENCOUNTER — Other Ambulatory Visit: Payer: Self-pay | Admitting: Primary Care

## 2021-05-03 DIAGNOSIS — K649 Unspecified hemorrhoids: Secondary | ICD-10-CM

## 2021-05-03 NOTE — Telephone Encounter (Signed)
Called patient l/m to call office to find out what cream she would like in addition to this

## 2021-05-03 NOTE — Telephone Encounter (Signed)
Ok to fill 

## 2021-05-03 NOTE — Telephone Encounter (Signed)
Ms. Reeder called in returning [place] phone call. She stated that she wanted to the Hydrocortisone cream and the suppository

## 2021-05-03 NOTE — Telephone Encounter (Signed)
Patient call in requesting medication re fill . Would also like a cream to sent in   Encourage patient to contact the pharmacy for refills or they can request refills through [place]:  Please schedule appointment if longer than 1 year  NEXT APPOINTMENT DATE:  MEDICATION:hydrocortisone (ANUSOL-HC) 25 MG suppository  Is the patient out of medication?   PHARMACY:[id number] [place], [place] - [address]  Let patient know to contact pharmacy at the end of the day to make sure medication is ready.  Please notify patient to allow 48-72 hours to process  CLINICAL FILLS OUT ALL BELOW:   LAST REFILL:  QTY:  REFILL DATE:    OTHER COMMENTS:    Okay for refill?  Please advise

## 2021-05-04 MED ORDER — HYDROCORTISONE (PERIANAL) 2.5 % EX CREA: 1.0000 "application " | TOPICAL_CREAM | Freq: Two times a day (BID) | CUTANEOUS | 0 refills | Status: DC | PRN

## 2021-05-04 NOTE — Telephone Encounter (Signed)
Noted, both Rx's sent to [facility] as this is where the initial request came from.

## 2021-05-06 NOTE — Telephone Encounter (Signed)
[place] Night - Client >>>Contains Verbal Order - Signature Required<<< TELEPHONE ADVICE RECORD AccessNurse Patient Name: Virginia Bird Gender: Female DOB: [birth year] Age: 40 Y 14 D Return Phone Number: [phone] (Primary) Address: City/ State/ Zip: [place] [address] Client [place] Night - Client Client Site [place] Physician Alma Friendly - NP Contact Type Call Who Is Calling Patient / Member / Family / Caregiver Call Type Triage / Clinical Relationship To Patient Self Return Phone Number [phone] (Primary) Chief Complaint Rectal Foreign Body Reason for Call Symptomatic / Request for [place] states she needs a refill for a suppository for her hemorrhoids. She is having rectal pain when she goes to the bathroom. Translation No Nurse Assessment Nurse: Jerene Bears, RN, Will Date/Time Eilene Ghazi Time): 05/03/2021 7:03:02 PM Confirm and document reason for call. If symptomatic, describe symptoms. ---Caller reports pain with bowel movements. She spoke with office today and she was assuming that refill would be called in but she did not hear back from office. Caller reports hx of hemorrhoids with no bleeding. Does the patient have any new or worsening symptoms? ---Yes Will a triage be completed? ---Yes Related visit to physician within the last 2 weeks? ---No Does the PT have any chronic conditions? (i.e. diabetes, asthma, this includes High risk factors for pregnancy, etc.) ---No Is the patient pregnant or possibly pregnant? (Ask all females between the ages of 87-55) ---No Is this a behavioral health or substance abuse call? ---No Nurse: Jerene Bears, RN, Will Date/Time (Eastern Time): 05/03/2021 7:16:00 PM Please select the assessment type ---Refill Additional Documentation ---[place], [place], [place] [address] [phone] Does the patient have enough medication to last until the office opens? ---Unable to obtain loaner dose from Pharmacy PLEASE NOTE: All timestamps contained within this report are represented as [place] Standard Time. CONFIDENTIALTY NOTICE: This fax transmission is intended only for the addressee. It contains information that is legally privileged, confidential or otherwise protected from use or disclosure. If you are not the intended recipient, you are strictly prohibited from reviewing, disclosing, copying using or disseminating any of this information or taking any action in reliance on or regarding this information. If you have received this fax in error, please notify us immediately by telephone so that we can arrange for its return to [place]. Phone: [phone], Toll-Free: [phone], Fax: [phone] Page: 2 of 3 Call Id: [id number] Nurse Assessment Does the client directives allow for assistance with medications after hours? ---Yes Was the medication filled within the last 6 months? ---Yes What is the name of the medication, dose and instructions as listed on the bottle? ---hydrocortisone suppository Name of the physician as listed on the bottle. ---Alma Friendly NP Pharmacy name and phone number where most recently filled. ---[phone] Additional Documentation ---maintenance medication called into requested pharmacy per client directive Guidelines Guideline Title Affirmed Question Affirmed Notes Nurse Date/Time Eilene Ghazi Time) Rectal Symptoms [1] Home treatment > 3 days for rectal pain AND [2] not improved Jerene Bears, RN, Will 05/03/2021 7:08:16 PM Disp. Time Eilene Ghazi Time) Disposition Final User 05/03/2021 7:10:34 PM SEE PCP WITHIN 3 DAYS Yes Jerene Bears, RN, Will Caller Disagree/Comply Comply Caller Understands Yes PreDisposition Call Doctor Care Advice Given Per Guideline SEE PCP WITHIN 3 DAYS: * You need to be seen within 2 or 3 days. CALL BACK IF: * Severe pain  * Fever occurs * You become worse CARE ADVICE given per Rectal Symptoms (  Adult) guideline. Verbal Orders/Maintenance Medications Medication Refill Route Dosage Regime Duration Admin Instructions User Name hydrocortisone suppository Yes Per Rectum bid 3 Days dispense qty 6, no refills [place], Therapist, sports, Will Referrals REFERRED TO PCP OFFICE PLEASE NOTE: All timestamps contained within this report are represented as [place] Standard Time. CONFIDENTIALTY NOTICE: This fax transmission is intended only for the addressee. It contains information that is legally privileged, confidential or otherwise protected from use or disclosure. If you are not the intended recipient, you are strictly prohibited from reviewing, disclosing, copying using or disseminating any of this information or taking any action in reliance on or regarding this information. If you have received this fax in error, please notify us immediately by telephone so that we can arrange for its return to [place]. Phone: [phone], Toll-Free: [phone], Fax: [phone] Page: 3 of 3 Call Id: [id number] AccessNurse >>>Contains Verbal Order - Signature Required<<< [address], [place] [address] [place] [address] [phone] Fax: [phone] [place] Night - Client [place] - Night Date: 05/03/2021 From: [facility]I Department To: Alma Friendly - NP Please sign the order for the approved drug(s) given by our call center nurse on your behalf.  Fax to [phone] within 5 business days. Thank you. Date Eilene Ghazi Time): 05/03/2021 6:51:40 PM Triage RN: Janeece Agee, RN NAME: Virginia Bird PHONE NUMBER: [phone] (Primary) BIRTHDATE: Jan 14, 1981 ADDRESS: CITY/STATE/ZIP: [place] [address] CALLER: Self NAME: Rx Given Medication Ordering Physician Refill Route Dosage Regime Duration Admin Instructions User  Name hydrocortisone suppository Alma Friendly - NP Yes Per Rectum bid 3 Days dispense qty 6, no refills Jerene Bears, RN, Will MD Signature Date

## 2021-05-06 NOTE — Telephone Encounter (Signed)
Looks like the pt did not receive the script sent in so Acc[facility] nurse sent in a short term script. There is an order attached to the access nurse note that will require Virginia Bird to sign for the verbal order give by access nurse for the refill. Please f/u with triage in order to get the last page of the fax that Virginia Bird needs to sign.May also need to f/u with the pt to make sure she received the original refill and not just a short term refill.

## 2021-05-06 NOTE — Telephone Encounter (Signed)
Left message to return call to our office.  

## 2021-05-09 NOTE — Telephone Encounter (Signed)
Left message to return call to our office.  

## 2021-05-16 NOTE — Telephone Encounter (Signed)
Is there a fax that I am supposed to sign?

## 2021-05-16 NOTE — Telephone Encounter (Signed)
I have not seen

## 2021-05-16 NOTE — Telephone Encounter (Signed)
Called patient x 3 not able to reach

## 2021-06-04 ENCOUNTER — Encounter: Payer: Self-pay | Admitting: Gastroenterology

## 2021-06-04 ENCOUNTER — Ambulatory Visit (INDEPENDENT_AMBULATORY_CARE_PROVIDER_SITE_OTHER): Payer: BC Managed Care – PPO | Admitting: Gastroenterology

## 2021-06-04 ENCOUNTER — Other Ambulatory Visit: Payer: Self-pay

## 2021-06-04 VITALS — BP 122/81 | HR 90 | Temp 97.1°F | Ht 61.0 in | Wt 156.6 lb

## 2021-06-04 DIAGNOSIS — K644 Residual hemorrhoidal skin tags: Secondary | ICD-10-CM

## 2021-06-04 DIAGNOSIS — K640 First degree hemorrhoids: Secondary | ICD-10-CM

## 2021-06-04 DIAGNOSIS — K600 Acute anal fissure: Secondary | ICD-10-CM | POA: Diagnosis not present

## 2021-06-04 DIAGNOSIS — D500 Iron deficiency anemia secondary to blood loss (chronic): Secondary | ICD-10-CM

## 2021-06-04 NOTE — Progress Notes (Signed)
Cephas Darby, MD [address]  [place]  [place], [place] [address]  Main: [phone]  Fax: [phone]    Gastroenterology Consultation  Referring Provider:     Pleas Koch, NP Primary Care Physician:  Pleas Koch, NP Primary Gastroenterologist:  Dr. Cephas Darby Reason for Consultation:     Symptomatic hemorrhoids        HPI:   Virginia Bird is a 40 y.o. female referred by Dr. Carlis Abbott, Leticia Penna, NP  for consultation & management of symptomatic hemorrhoids.  Patient reports that for last 1 year, she has been experiencing rectal pressure, swelling associated with intense itching, protrusion of tissue, pain.  She denies constipation, diarrhea, abdominal bloating, abdominal pain, weight loss.  She has tried Anusol cream which does not provide much relief.  She spends about 5 minutes on the toilet.  She denies any straining.  She also has history of severe iron deficiency anemia which is thought to be secondary to uterine fibroids and menorrhagia.  She is closely followed by Dr. Grayland Ormond for parenteral iron therapy as needed.  Her lowest hemoglobin was 8.4 on 11/08/2019, MCV 60.3, most recently 11.4 with normal MCV on 04/04/2021.  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    Current Outpatient Medications:    ANUCORT-HC 25 MG suppository, INSERT 1 SUPPOSITORY RECTALLY TWICE DAILY, Disp: 12 suppository, Rfl: 0   EPINEPHrine 0.3 mg/0.3 mL IJ SOAJ injection, Inject 0.3 mg into the muscle  as needed for anaphylaxis., Disp: 1 each, Rfl: 0   fluticasone (FLONASE) 50 MCG/ACT nasal spray, Place 2 sprays into both nostrils daily., Disp: 16 g, Rfl: 6   hydrocortisone (ANUSOL-HC) 2.5 % rectal cream, Place 1 application rectally 2 (two) times daily as needed for hemorrhoids or anal itching., Disp: 28 g, Rfl: 0    Family History  Problem Relation Age of Onset   Thyroid disease Mother    Hypertension Mother    Cancer Father        leukemia   Stroke Father    Cancer Maternal Aunt        Breast Cancer   Breast cancer Maternal Aunt    Asthma Sister    Miscarriages / [place] Sister      Social History   Tobacco Use   Smoking status: Every Day    Packs/day: 0.50    Years: 10.00    Pack years: 5.00    Types: Cigarettes   Smokeless tobacco: Never   Tobacco comments:    Has not tried any quit aids/tn  Vaping Use   Vaping Use: Never used  Substance Use Topics   Alcohol use: Yes    Alcohol/week: 0.0 standard drinks    Comment: weekends   Drug use: No    Allergies as of 06/04/2021   (No Known Allergies)    Review of Systems:    All systems reviewed and negative except where noted in HPI.   Physical Exam:  BP 122/81 (BP  Location: Left Arm, Patient Position: Sitting, Cuff Size: Normal)   Pulse 90   Temp (!) 97.1 F (36.2 C) (Oral)   Ht 5\' 1"  (1.549 m)   Wt 156 lb 9.6 oz (71 kg)   BMI 29.59 kg/m  No LMP recorded.  General:   Alert,  Well-developed, well-nourished, pleasant and cooperative in NAD Head:  Normocephalic and atraumatic. Eyes:  Sclera clear, no icterus.   Conjunctiva pink. Ears:  Normal auditory acuity. Nose:  No deformity, discharge, or lesions. Mouth:  No deformity or lesions,oropharynx pink & moist. Neck:  Supple; no masses or thyromegaly. Lungs:  Respirations even and unlabored.  Clear throughout to auscultation.   No wheezes, crackles, or rhonchi. No acute distress. Heart:  Regular rate and rhythm; no murmurs, clicks, rubs, or  gallops. Abdomen:  Normal bowel sounds. Soft, non-tender and non-distended without masses, hepatosplenomegaly or hernias noted.  No guarding or rebound tenderness.   Rectal: Severe point tenderness right at the anal verge at 9 o'clock position in left lateral position consistent with anal fissure.  A small nontender perianal skin tag. Msk:  Symmetrical without gross deformities. Good, equal movement & strength bilaterally. Pulses:  Normal pulses noted. Extremities:  No clubbing or edema.  No cyanosis. Neurologic:  Alert and oriented x3;  grossly normal neurologically. Skin:  Intact without significant lesions or rashes. No jaundice. Psych:  Alert and cooperative. Normal mood and affect.  Imaging Studies: No abdominal imaging  Assessment and Plan:   Virginia Bird is a 40 y.o. African-American female with history of severe iron deficiency anemia, multiple uterine fibroids, menorrhagia is seen in consultation for rectal pressure, swelling, itching, pain.  Digital rectal exam is consistent with posterior anal fissure  Posterior anal fissure Recommend 0.125% nitroglycerin with 5% lidocaine, instructions provided  Grade 1 symptomatic hemorrhoids Discussed about hemorrhoid ligation including risks and benefits Patient consented for hemorrhoid ligation today Will perform banding of the right anterior hemorrhoid  Chronic iron deficiency anemia Given patient's age and ethnicity, discussed with her to proceed with endoscopic evaluation including upper endoscopy and colonoscopy primarily to rule out colorectal malignancy.  Patient will think about it and let me know during next visit in 2 weeks.  This will also give her time for healing of anal fissure Follow-up with OB/GYN to discuss about hysterectomy  Perianal skin tag Discussed with surgical excision if needed after above issues are addressed   Follow up in 2 weeks   Cephas Darby, MD

## 2021-06-04 NOTE — Patient Instructions (Addendum)
[facility] [address], [place] [address].   Phone number [phone].   Please allow at least 24 hours before picking up the compounded cream because the pharmacy has to make the medication.  

## 2021-06-04 NOTE — Progress Notes (Signed)
PROCEDURE NOTE: The patient presents with symptomatic grade 1 hemorrhoids, unresponsive to maximal medical therapy, requesting rubber band ligation of his/her hemorrhoidal disease.  All risks, benefits and alternative forms of therapy were described and informed consent was obtained.  In the Left Lateral Decubitus position (if anoscopy is performed) anoscopic examination revealed grade 1 hemorrhoids in the all position(s).   The decision was made to band the RA internal hemorrhoid, and the [facility]'Regan System was used to perform band ligation without complication.  Digital anorectal examination was then performed to assure proper positioning of the band, and to adjust the banded tissue as required.  The patient was discharged home without pain or other issues.  Dietary and behavioral recommendations were given and (if necessary - prescriptions were given), along with follow-up instructions.  The patient will return 2 weeks for follow-up and possible additional banding as required.  No complications were encountered and the patient tolerated the procedure well.   

## 2021-06-18 ENCOUNTER — Encounter: Payer: Self-pay | Admitting: Gastroenterology

## 2021-06-18 ENCOUNTER — Other Ambulatory Visit: Payer: Self-pay

## 2021-06-18 ENCOUNTER — Ambulatory Visit (INDEPENDENT_AMBULATORY_CARE_PROVIDER_SITE_OTHER): Payer: BC Managed Care – PPO | Admitting: Gastroenterology

## 2021-06-18 VITALS — BP 119/81 | HR 91 | Temp 99.2°F | Ht 61.0 in | Wt 157.2 lb

## 2021-06-18 DIAGNOSIS — K600 Acute anal fissure: Secondary | ICD-10-CM | POA: Diagnosis not present

## 2021-06-18 DIAGNOSIS — K640 First degree hemorrhoids: Secondary | ICD-10-CM

## 2021-06-18 NOTE — Progress Notes (Signed)
Cephas Darby, MD [address]  [place]  [place], [place] [address]  Main: [phone]  Fax: [phone]    Gastroenterology Consultation  Referring Provider:     Pleas Koch, NP Primary Care Physician:  Pleas Koch, NP Primary Gastroenterologist:  Dr. Cephas Darby Reason for Consultation:     Symptomatic hemorrhoids anal fissure        HPI:   Virginia Bird is a 40 y.o. female referred by Dr. Carlis Abbott, Leticia Penna, NP  for consultation & management of symptomatic hemorrhoids.  Patient reports that for last 1 year, she has been experiencing rectal pressure, swelling associated with intense itching, protrusion of tissue, pain.  She denies constipation, diarrhea, abdominal bloating, abdominal pain, weight loss.  She has tried Anusol cream which does not provide much relief.  She spends about 5 minutes on the toilet.  She denies any straining.  She also has history of severe iron deficiency anemia which is thought to be secondary to uterine fibroids and menorrhagia.  She is closely followed by Dr. Grayland Ormond for parenteral iron therapy as needed.  Her lowest hemoglobin was 8.4 on 11/08/2019, MCV 60.3, most recently 11.4 with normal MCV on 04/04/2021.  Patient denies dark stools, melena, hematochezia, rectal bleeding  Follow-up visit 06/18/2021 Patient reports that her symptoms have significantly improved.  She is diagnosed with acute posterior anal fissure during last visit, started on 0.125% nitroglycerin with 5% lidocaine.  She was applying 3 times a day, currently 2 times a day.  Her rectal pain is mild.  She reports that her hemorrhoidal symptoms also have significantly improved.  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    Current Outpatient Medications:    ANUCORT-HC 25 MG suppository, INSERT 1 SUPPOSITORY RECTALLY TWICE DAILY, Disp: 12 suppository, Rfl: 0   EPINEPHrine 0.3 mg/0.3 mL IJ SOAJ injection, Inject 0.3 mg into the muscle as needed for anaphylaxis., Disp: 1 each, Rfl: 0   fluticasone (FLONASE) 50 MCG/ACT nasal spray, Place 2 sprays into both nostrils daily., Disp: 16 g, Rfl: 6   hydrocortisone (ANUSOL-HC) 2.5 % rectal cream, Place 1 application rectally 2 (two) times daily as needed for hemorrhoids or anal itching., Disp: 28 g, Rfl: 0    Family History  Problem Relation Age of Onset   Thyroid disease Mother    Hypertension Mother    Cancer Father        leukemia   Stroke Father    Cancer Maternal Aunt        Breast Cancer   Breast cancer Maternal Aunt    Asthma Sister    Miscarriages / [place] Sister      Social History   Tobacco Use   Smoking status: Every Day    Packs/day: 0.50    Years: 10.00    Pack years: 5.00    Types: Cigarettes   Smokeless tobacco: Never   Tobacco comments:    Has not tried any quit aids/tn  Vaping Use  Vaping Use: Never used  Substance Use Topics   Alcohol use: Yes    Alcohol/week: 0.0 standard drinks    Comment: weekends   Drug use: No    Allergies as of 06/18/2021   (No Known Allergies)    Review of Systems:    All systems reviewed and negative except where noted in HPI.   Physical Exam:  BP 119/81 (BP Location: Left Arm, Patient Position: Sitting, Cuff Size: Normal)   Pulse 91   Temp 99.2 F (37.3 C) (Oral)   Ht 5\' 1"  (1.549 m)   Wt 71.3 kg   BMI 29.70 kg/m  No LMP recorded.  General:   Alert,  Well-developed, well-nourished, pleasant and cooperative in NAD Head:  Normocephalic and atraumatic. Eyes:  Sclera clear, no  icterus.   Conjunctiva pink. Ears:  Normal auditory acuity. Nose:  No deformity, discharge, or lesions. Mouth:  No deformity or lesions,oropharynx pink & moist. Neck:  Supple; no masses or thyromegaly. Lungs:  Respirations even and unlabored.  Clear throughout to auscultation.   No wheezes, crackles, or rhonchi. No acute distress. Heart:  Regular rate and rhythm; no murmurs, clicks, rubs, or gallops. Abdomen:  Normal bowel sounds. Soft, non-tender and non-distended without masses, hepatosplenomegaly or hernias noted.  No guarding or rebound tenderness.   Rectal: Light tenderness of the skin tag.  Otherwise, nontender digital rectal exam  Msk:  Symmetrical without gross deformities. Good, equal movement & strength bilaterally. Pulses:  Normal pulses noted. Extremities:  No clubbing or edema.  No cyanosis. Neurologic:  Alert and oriented x3;  grossly normal neurologically. Skin:  Intact without significant lesions or rashes. No jaundice. Psych:  Alert and cooperative. Normal mood and affect.  Imaging Studies: No abdominal imaging  Assessment and Plan:   KARIS RILLING is a 40 y.o. African-American female with history of severe iron deficiency anemia, multiple uterine fibroids, menorrhagia is seen in consultation for symptomatic grade 1 hemorrhoids and posterior anal fissure  Posterior anal fissure: Significantly improved Continue 0.125% nitroglycerin with 5% lidocaine, instructions provided  Grade 1 symptomatic hemorrhoids Discussed about hemorrhoid ligation including risks and benefits Patient consented for hemorrhoid ligation today Will perform banding of the left lateral hemorrhoid  Chronic iron deficiency anemia Given patient's age and ethnicity, discussed with her to proceed with endoscopic evaluation including upper endoscopy and colonoscopy primarily to rule out colorectal malignancy.  Patient will think about it and let me know during next visit in 2 weeks.  This will also  give her time for healing of anal fissure Follow-up with OB/GYN to discuss about hysterectomy  Perianal skin tag Discussed with surgical excision if needed after above issues are addressed   Follow up in 2 weeks   Cephas Darby, MD

## 2021-06-18 NOTE — Progress Notes (Signed)
PROCEDURE NOTE: The patient presents with symptomatic grade 1 hemorrhoids, unresponsive to maximal medical therapy, requesting rubber band ligation of his/her hemorrhoidal disease.  All risks, benefits and alternative forms of therapy were described and informed consent was obtained.  The decision was made to band the LL internal hemorrhoid, and the [facility] System was used to perform band ligation without complication.  Digital anorectal examination was then performed to assure proper positioning of the band, and to adjust the banded tissue as required.  The patient was discharged home without pain or other issues.  Dietary and behavioral recommendations were given and (if necessary - prescriptions were given), along with follow-up instructions.  The patient will return 2 weeks for follow-up and possible additional banding as required.  No complications were encountered and the patient tolerated the procedure well.    

## 2021-07-03 ENCOUNTER — Encounter: Payer: Self-pay | Admitting: Gastroenterology

## 2021-07-03 ENCOUNTER — Other Ambulatory Visit: Payer: Self-pay

## 2021-07-03 ENCOUNTER — Ambulatory Visit (INDEPENDENT_AMBULATORY_CARE_PROVIDER_SITE_OTHER): Payer: BC Managed Care – PPO | Admitting: Gastroenterology

## 2021-07-03 VITALS — BP 125/81 | HR 93 | Temp 98.4°F | Ht 61.0 in | Wt 157.8 lb

## 2021-07-03 DIAGNOSIS — K644 Residual hemorrhoidal skin tags: Secondary | ICD-10-CM

## 2021-07-03 DIAGNOSIS — K600 Acute anal fissure: Secondary | ICD-10-CM

## 2021-07-03 DIAGNOSIS — K640 First degree hemorrhoids: Secondary | ICD-10-CM

## 2021-07-03 NOTE — Progress Notes (Signed)
PROCEDURE NOTE: The patient presents with symptomatic grade 1 hemorrhoids, unresponsive to maximal medical therapy, requesting rubber band ligation of his/her hemorrhoidal disease.  All risks, benefits and alternative forms of therapy were described and informed consent was obtained.  The decision was made to band the RP internal hemorrhoid, and the [place] was used to perform band ligation without complication.  Digital anorectal examination was then performed to assure proper positioning of the band, and to adjust the banded tissue as required.  The patient was discharged home without pain or other issues.  Dietary and behavioral recommendations were given and (if necessary - prescriptions were given), along with follow-up instructions.  The patient will return  as needed for follow-up and possible additional banding as required.  No complications were encountered and the patient tolerated the procedure well.  Patient's anal fissure has significantly healed.  She denies any rectal pain.  She reports feeling significantly better.  She has perianal skin tag and prefers to be removed.  Advised her to see surgery after a month and allow complete healing of the fissure.  She will call my office to remind me about the referral in a month

## 2021-07-11 ENCOUNTER — Telehealth: Payer: BC Managed Care – PPO | Admitting: Physician Assistant

## 2021-07-11 DIAGNOSIS — B9689 Other specified bacterial agents as the cause of diseases classified elsewhere: Secondary | ICD-10-CM | POA: Diagnosis not present

## 2021-07-11 DIAGNOSIS — J019 Acute sinusitis, unspecified: Secondary | ICD-10-CM

## 2021-07-11 MED ORDER — FLUTICASONE PROPIONATE 50 MCG/ACT NA SUSP: 2.0000 | Freq: Every day | NASAL | 0 refills | Status: DC

## 2021-07-11 MED ORDER — AMOXICILLIN-POT CLAVULANATE 875-125 MG PO TABS: 1.0000 | ORAL_TABLET | Freq: Two times a day (BID) | ORAL | 0 refills | Status: DC

## 2021-07-11 NOTE — Progress Notes (Signed)
E-Visit for Sinus Problems  We are sorry that you are not feeling well.  Here is how we plan to help!  Based on what you have shared with me it looks like you have sinusitis.  Sinusitis is inflammation and infection in the sinus cavities of the head.  Based on your presentation I believe you most likely have Acute Bacterial Sinusitis.  This is an infection caused by bacteria and is treated with antibiotics. I have prescribed Augmentin 875mg /125mg  one tablet twice daily with food, for 7 days. I have also sent in a refill of the Flonase to use daily as directed. You may use an oral decongestant such as Mucinex D or if you have glaucoma or high blood pressure use plain Mucinex. Saline nasal spray help and can safely be used as often as needed for congestion.  If you develop worsening sinus pain, fever or notice severe headache and vision changes, or if symptoms are not better after completion of antibiotic, please schedule an appointment with a health care provider.    Sinus infections are not as easily transmitted as other respiratory infection, however we still recommend that you avoid close contact with loved ones, especially the very young and elderly.  Remember to wash your hands thoroughly throughout the day as this is the number one way to prevent the spread of infection!  Home Care: Only take medications as instructed by your medical team. Complete the entire course of an antibiotic. Do not take these medications with alcohol. A steam or ultrasonic humidifier can help congestion.  You can place a towel over your head and breathe in the steam from hot water coming from a faucet. Avoid close contacts especially the very young and the elderly. Cover your mouth when you cough or sneeze. Always remember to wash your hands.  Get Help Right Away If: You develop worsening fever or sinus pain. You develop a severe head ache or visual changes. Your symptoms persist after you have completed your  treatment plan.  Make sure you Understand these instructions. Will watch your condition. Will get help right away if you are not doing well or get worse.  Thank you for choosing an e-visit.  Your e-visit answers were reviewed by a board certified advanced clinical practitioner to complete your personal care plan. Depending upon the condition, your plan could have included both over the counter or prescription medications.  Please review your pharmacy choice. Make sure the pharmacy is open so you can pick up prescription now. If there is a problem, you may contact your provider through [employer] and have the prescription routed to another pharmacy.  Your safety is important to [place]. If you have drug allergies check your prescription carefully.   For the next 24 hours you can use MyChart to ask questions about today's visit, request a non-urgent call back, or ask for a work or school excuse. You will get an email in the next two days asking about your experience. I hope that your e-visit has been valuable and will speed your recovery.

## 2021-07-11 NOTE — Progress Notes (Signed)
I have spent 5 minutes in review of e-visit questionnaire, review and updating patient chart, medical decision making and response to patient.   Taheerah Guldin Cody Jing Howatt, PA-C    

## 2021-07-12 ENCOUNTER — Telehealth: Payer: BC Managed Care – PPO | Admitting: Primary Care

## 2021-07-12 ENCOUNTER — Other Ambulatory Visit: Payer: Self-pay

## 2021-07-30 ENCOUNTER — Telehealth: Payer: Self-pay | Admitting: Gastroenterology

## 2021-07-30 ENCOUNTER — Telehealth: Payer: Self-pay

## 2021-07-30 NOTE — Telephone Encounter (Signed)
Patient having discomfort from hemorrhoids I made her an office visit for Aug 05 2021 at 2 but wants so sort of pain medicine for the discomfort till her appointment

## 2021-07-30 NOTE — Telephone Encounter (Signed)
Inbound call from pt requesting a call back stating that she has been having so discomfort with her hemorrhoids. Please advise.

## 2021-07-31 ENCOUNTER — Encounter: Payer: Self-pay | Admitting: Gastroenterology

## 2021-07-31 ENCOUNTER — Ambulatory Visit (INDEPENDENT_AMBULATORY_CARE_PROVIDER_SITE_OTHER): Payer: BC Managed Care – PPO | Admitting: Gastroenterology

## 2021-07-31 VITALS — BP 144/99 | HR 92 | Temp 98.4°F | Ht 61.0 in | Wt 160.6 lb

## 2021-07-31 DIAGNOSIS — D500 Iron deficiency anemia secondary to blood loss (chronic): Secondary | ICD-10-CM

## 2021-07-31 DIAGNOSIS — K640 First degree hemorrhoids: Secondary | ICD-10-CM | POA: Diagnosis not present

## 2021-07-31 DIAGNOSIS — K600 Acute anal fissure: Secondary | ICD-10-CM

## 2021-07-31 NOTE — Progress Notes (Signed)
Cephas Darby, MD [address]  [place]  [place], [place] [address]  Main: [phone]  Fax: [phone]    Gastroenterology Consultation  Referring Provider:     Pleas Koch, NP Primary Care Physician:  Pleas Koch, NP Primary Gastroenterologist:  Dr. Cephas Darby Reason for Consultation:     Symptomatic hemorrhoids and anal fissure        HPI:   Virginia Bird is a 40 y.o. female referred by Dr. Carlis Abbott, Leticia Penna, NP  for consultation & management of symptomatic hemorrhoids.  Patient reports that for last 1 year, she has been experiencing rectal pressure, swelling associated with intense itching, protrusion of tissue, pain.  She denies constipation, diarrhea, abdominal bloating, abdominal pain, weight loss.  She has tried Anusol cream which does not provide much relief.  She spends about 5 minutes on the toilet.  She denies any straining.  She also has history of severe iron deficiency anemia which is thought to be secondary to uterine fibroids and menorrhagia.  She is closely followed by Dr. Grayland Ormond for parenteral iron therapy as needed.  Her lowest hemoglobin was 8.4 on 11/08/2019, MCV 60.3, most recently 11.4 with normal MCV on 04/04/2021.  Patient denies dark stools, melena, hematochezia, rectal bleeding  Follow-up visit 06/18/2021 Patient reports that her symptoms have significantly improved.  She is diagnosed with acute posterior anal fissure during last visit, started on 0.125% nitroglycerin with 5% lidocaine.  She was applying 3 times a day, currently 2 times a day.  Her rectal pain is mild.  She reports that her hemorrhoidal symptoms also have significantly improved.  She is here for her second banding  Follow-up visit 07/31/2021 Patient underwent rubber band ligation of all 3 hemorrhoids.  She is here to discuss if she needs another banding because of rectal pain.  Patient denies any bleeding.  She continues to take fusion plus.  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    Current Outpatient Medications:    amoxicillin-clavulanate (AUGMENTIN) 875-125 MG tablet, Take 1 tablet by mouth 2 (two) times daily., Disp: 14 tablet, Rfl: 0   ANUCORT-HC 25 MG suppository, INSERT 1 SUPPOSITORY RECTALLY TWICE DAILY, Disp: 12 suppository, Rfl: 0   EPINEPHrine 0.3 mg/0.3 mL IJ SOAJ injection, Inject 0.3 mg into the muscle as needed for anaphylaxis., Disp: 1 each, Rfl: 0   fluticasone (FLONASE) 50 MCG/ACT nasal spray, Place 2 sprays into both nostrils daily., Disp: 16 g, Rfl: 0   hydrocortisone (ANUSOL-HC) 2.5 % rectal cream, Place 1 application rectally 2 (two) times daily as needed for hemorrhoids or anal itching., Disp: 28 g, Rfl: 0    Family History  Problem Relation Age of Onset   Thyroid disease Mother    Hypertension Mother    Cancer Father        leukemia   Stroke Father    Cancer Maternal Aunt        Breast Cancer   Breast cancer Maternal Aunt    Asthma Sister    Miscarriages /  Stillbirths Sister      Social History   Tobacco Use   Smoking status: Every Day    Packs/day: 0.50    Years: 10.00    Pack years: 5.00    Types: Cigarettes   Smokeless tobacco: Never   Tobacco comments:    Has not tried any quit aids/tn  Vaping Use   Vaping Use: Never used  Substance Use Topics   Alcohol use: Yes    Alcohol/week: 0.0 standard drinks    Comment: weekends   Drug use: No    Allergies as of 07/31/2021   (No Known Allergies)    Review of Systems:    All systems reviewed and negative except where noted in HPI.    Physical Exam:  BP (!) 144/99 (BP Location: Right Arm, Patient Position: Sitting, Cuff Size: Normal)   Pulse 92   Temp 98.4 F (36.9 C) (Oral)   Ht 5\' 1"  (1.549 m)   Wt 160 lb 9.6 oz (72.8 kg)   BMI 30.35 kg/m  No LMP recorded.  General:   Alert,  Well-developed, well-nourished, pleasant and cooperative in NAD Head:  Normocephalic and atraumatic. Eyes:  Sclera clear, no icterus.   Conjunctiva pink. Ears:  Normal auditory acuity. Nose:  No deformity, discharge, or lesions. Mouth:  No deformity or lesions,oropharynx pink & moist. Neck:  Supple; no masses or thyromegaly. Lungs:  Respirations even and unlabored.  Clear throughout to auscultation.   No wheezes, crackles, or rhonchi. No acute distress. Heart:  Regular rate and rhythm; no murmurs, clicks, rubs, or gallops. Abdomen:  Normal bowel sounds. Soft, non-tender and non-distended without masses, hepatosplenomegaly or hernias noted.  No guarding or rebound tenderness.   Rectal: Perianal skin tag which is nontender.  Digital rectal exam revealed sharp point tenderness at the posterior wall of the anal sphincter consistent with anal fissure. Msk:  Symmetrical without gross deformities. Good, equal movement & strength bilaterally. Pulses:  Normal pulses noted. Extremities:  No clubbing or edema.  No cyanosis. Neurologic:  Alert and oriented x3;  grossly normal neurologically. Skin:  Intact without significant lesions or rashes. No jaundice. Psych:  Alert and cooperative. Normal mood and affect.  Imaging Studies: No abdominal imaging  Assessment and Plan:   Virginia Bird is a 40 y.o. African-American female with history of severe iron deficiency anemia, multiple uterine fibroids, menorrhagia is seen in consultation for symptomatic grade 1 hemorrhoids and posterior anal fissure  Posterior anal fissure: Recurrence of pain Continue 0.125% nitroglycerin with 5% lidocaine, instructions provided  Grade 1 symptomatic hemorrhoids S/p  ligation x3  Chronic iron deficiency anemia Anemia is improving Continue fusion plus Follow-up with hematology in December Given patient's age and ethnicity, discussed with her to proceed with endoscopic evaluation including upper endoscopy and colonoscopy primarily to rule out colorectal malignancy.  Patient said she will check with her insurance company and call us back to schedule these procedures  Follow-up with OB/GYN to discuss about hysterectomy  Perianal skin tag -asymptomatic Defer surgical excision    Follow up as needed   Cephas Darby, MD

## 2021-08-05 ENCOUNTER — Ambulatory Visit: Payer: BC Managed Care – PPO | Admitting: Gastroenterology

## 2021-08-15 ENCOUNTER — Inpatient Hospital Stay: Payer: BC Managed Care – PPO | Attending: Nurse Practitioner

## 2021-08-15 DIAGNOSIS — D509 Iron deficiency anemia, unspecified: Secondary | ICD-10-CM | POA: Insufficient documentation

## 2021-08-19 ENCOUNTER — Inpatient Hospital Stay: Payer: BC Managed Care – PPO | Admitting: Nurse Practitioner

## 2021-08-19 ENCOUNTER — Inpatient Hospital Stay: Payer: BC Managed Care – PPO

## 2021-08-23 ENCOUNTER — Other Ambulatory Visit: Payer: Self-pay

## 2021-08-23 ENCOUNTER — Inpatient Hospital Stay: Payer: BC Managed Care – PPO

## 2021-08-23 DIAGNOSIS — D509 Iron deficiency anemia, unspecified: Secondary | ICD-10-CM | POA: Diagnosis not present

## 2021-08-23 LAB — CBC WITH DIFFERENTIAL/PLATELET
Abs Immature Granulocytes: 0.04 10*3/uL (ref 0.00–0.07)
Basophils Absolute: 0.1 10*3/uL (ref 0.0–0.1)
Basophils Relative: 1 %
Eosinophils Absolute: 0.4 10*3/uL (ref 0.0–0.5)
Eosinophils Relative: 5 %
HCT: 36 % (ref 36.0–46.0)
Hemoglobin: 11.5 g/dL — ABNORMAL LOW (ref 12.0–15.0)
Immature Granulocytes: 1 %
Lymphocytes Relative: 22 %
Lymphs Abs: 1.9 10*3/uL (ref 0.7–4.0)
MCH: 25.6 pg — ABNORMAL LOW (ref 26.0–34.0)
MCHC: 31.9 g/dL (ref 30.0–36.0)
MCV: 80.2 fL (ref 80.0–100.0)
Monocytes Absolute: 0.6 10*3/uL (ref 0.1–1.0)
Monocytes Relative: 7 %
Neutro Abs: 5.6 10*3/uL (ref 1.7–7.7)
Neutrophils Relative %: 64 %
Platelets: 408 10*3/uL — ABNORMAL HIGH (ref 150–400)
RBC: 4.49 MIL/uL (ref 3.87–5.11)
RDW: 15.7 % — ABNORMAL HIGH (ref 11.5–15.5)
WBC: 8.6 10*3/uL (ref 4.0–10.5)
nRBC: 0 % (ref 0.0–0.2)

## 2021-08-23 LAB — IRON AND TIBC
Iron: 27 ug/dL — ABNORMAL LOW (ref 28–170)
Saturation Ratios: 8 % — ABNORMAL LOW (ref 10.4–31.8)
TIBC: 332 ug/dL (ref 250–450)
UIBC: 305 ug/dL

## 2021-08-23 LAB — FERRITIN: Ferritin: 16 ng/mL (ref 11–307)

## 2021-10-14 ENCOUNTER — Encounter: Payer: Self-pay | Admitting: Gastroenterology

## 2021-10-14 DIAGNOSIS — K600 Acute anal fissure: Secondary | ICD-10-CM

## 2021-10-15 NOTE — Telephone Encounter (Signed)
Placed referral  

## 2021-10-17 ENCOUNTER — Encounter: Payer: Self-pay | Admitting: Oncology

## 2021-10-24 ENCOUNTER — Telehealth: Payer: Self-pay | Admitting: Surgery

## 2021-10-24 ENCOUNTER — Ambulatory Visit (INDEPENDENT_AMBULATORY_CARE_PROVIDER_SITE_OTHER): Payer: BC Managed Care – PPO | Admitting: Surgery

## 2021-10-24 ENCOUNTER — Other Ambulatory Visit: Payer: Self-pay

## 2021-10-24 ENCOUNTER — Encounter: Payer: Self-pay | Admitting: Surgery

## 2021-10-24 VITALS — BP 115/80 | HR 80 | Temp 98.3°F | Ht 61.0 in | Wt 156.8 lb

## 2021-10-24 DIAGNOSIS — K644 Residual hemorrhoidal skin tags: Secondary | ICD-10-CM | POA: Diagnosis not present

## 2021-10-24 NOTE — Progress Notes (Signed)
Patient ID: Virginia Bird, female   DOB: [birth year], 41 y.o.   MRN: [id number]  Chief Complaint: Anal fissure  History of Present Illness Virginia Bird is a 41 y.o. female with an anal skin tag with associated degree of pain with defecation.  She has recently had banding of some internal hemorrhoids.  She reports her bowel movements are 1-2 times daily, she denies straining, she denies bleeding.  She does not avoid having a bowel movement because of the pain.  But there is some pain associated with each movement.  She has utilized nitroglycerin topically with some improvement though it does not appear to be sustained.  She presents for something more definitive and preventing her from having pain with her bowel activity.  She denies fevers and chills, she denies prior perianal or perirectal abscesses.  She denies itching burning and bleeding.  She denies constipation and diarrhea.  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 1 each 0   fluticasone (FLONASE) 50 MCG/ACT nasal spray Place 2 sprays into both nostrils daily. 16 g 0   hydrocortisone (ANUSOL-HC) 2.5 % rectal cream Place 1 application rectally 2 (two) times daily as needed for hemorrhoids or anal itching. 28 g 0   No current facility-administered medications for this visit.    Family History Family History  Problem Relation Age of Onset   Thyroid disease Mother    Hypertension Mother    Cancer  Father        leukemia   Stroke Father    Cancer Maternal Aunt        Breast Cancer   Breast cancer Maternal Aunt    Asthma Sister    Miscarriages / [place] Sister       Social History Social History   Tobacco Use   Smoking status: Every Day    Packs/day: 0.50    Years: 10.00    Pack years: 5.00    Types: Cigarettes   Smokeless tobacco: Never   Tobacco comments:    Has not tried any quit aids/tn  Vaping Use   Vaping Use: Never used  Substance Use Topics   Alcohol use: Yes    Alcohol/week: 0.0 standard drinks    Comment: weekends   Drug use: No       Review of Systems  Constitutional: Negative.   HENT: Negative.    Eyes: Negative.   Respiratory: Negative.    Cardiovascular: Negative.   Gastrointestinal: Negative.   Genitourinary: Negative.   Skin: Negative.   Neurological: Negative.   Psychiatric/Behavioral: Negative.       Physical Exam Blood pressure 115/80, pulse 80, temperature 98.3 F (36.8 C), temperature source Oral, height 5\' 1"  (1.549 m), weight 156 lb 12.8 oz (71.1 kg), SpO2 100 %. Last Weight  Most recent update: 10/24/2021  9:51 AM    Weight  71.1 kg (156 lb 12.8 oz)  CONSTITUTIONAL: Well developed, and nourished, appropriately responsive and aware without distress.   EYES: Sclera non-icteric.   EARS, NOSE, MOUTH AND THROAT: Mask worn.     Hearing is intact to voice.  NECK: Trachea is midline, and there is no jugular venous distension.  LYMPH NODES:  Lymph nodes in the neck are not enlarged. RESPIRATORY:  Lungs are clear, and breath sounds are equal bilaterally. Normal respiratory effort without pathologic use of accessory muscles. CARDIOVASCULAR: Heart is regular in rate and rhythm. GI: The abdomen is soft, nontender, and nondistended.   GU: [name] is present as chaperone.  With the patient in a kneeling position.  A digital rectal exam is completed.  She has an external tag that appears very consistent with a hemorrhoidal  skin tag however it appears it may have ulcerated more proximally.  It is not in the 12 or 6 o'clock position in fact it is 10:00 on the patient's posterior left.  In the 12 o'clock position there appears to be more scarring consistent with banded hemorrhoids?  She does have some discomfort and she does have some voluntary spasm/high level sphincter tone as a result.  I am not convinced she has hypertrophy of her internal sphincter causing the problem.  There seems to be very little additional redundancy of water otherwise mild circumferential internal hemorrhoids.  MUSCULOSKELETAL:  Symmetrical muscle tone appreciated in all four extremities.    SKIN: Skin turgor is normal. No pathologic skin lesions appreciated.  NEUROLOGIC:  Motor and sensation appear grossly normal.  Cranial nerves are grossly without defect. PSYCH:  Alert and oriented to person, place and time. Affect is appropriate for situation.  Data Reviewed I have personally reviewed what is currently available of the patient's imaging, recent labs and medical records.   Labs:  CBC Latest Ref Rng & Units 08/23/2021 04/04/2021 12/03/2020  WBC 4.0 - 10.5 K/uL 8.6 7.9 8.6  Hemoglobin 12.0 - 15.0 g/dL 11.5(L) 11.4(L) 10.4(L)  Hematocrit 36.0 - 46.0 % 36.0 35.3(L) 33.6(L)  Platelets 150 - 400 K/uL 408(H) 365 412(H)   CMP Latest Ref Rng & Units 03/05/2021 11/08/2019 05/28/2018  Glucose 70 - 99 mg/dL 88 92 94  BUN 6 - 23 mg/dL 7 7 8   Creatinine 0.40 - 1.20 mg/dL 0.70 0.72 0.70  Sodium 135 - 145 mEq/L 140 138 139  Potassium 3.5 - 5.1 mEq/L 3.5 3.5 3.5  Chloride 96 - 112 mEq/L 106 102 106  CO2 19 - 32 mEq/L 25 29 25   Calcium 8.4 - 10.5 mg/dL 8.6 8.9 9.1  Total Protein 6.0 - 8.3 g/dL 7.2 7.0 7.7  Total Bilirubin 0.2 - 1.2 mg/dL 0.2 0.4 0.3  Alkaline Phos 39 - 117 U/L 47 56 48  AST 0 - 37 U/L 14 13 13   ALT 0 - 35 U/L 10 8 11       Imaging:  Within last 24 hrs: No results found.  Assessment    Ulcerated external hemorrhoidal tag.   Possible anal fissure. 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  We discussed the role of  pursuing relaxation of the internal sphincter either chemically, or with surgery.  I am in agreement we should not be quite so aggressive on this first evaluation as her pain may be primarily due to an ulcerated external hemorrhoidal tag. We reviewed the risks of anal surgery, including stenosis, incontinence, soilage, pain, infection, anesthesia etc.  Face-to-face time spent with the patient and accompanying care providers(if present) was 40 minutes, with more than 50% of the time spent counseling, educating, and coordinating care of the patient.    These notes generated with voice recognition software. I apologize for typographical errors.  Ronny Bacon M.D., FACS 10/24/2021, 3:42 PM

## 2021-10-24 NOTE — H&P (View-Only) (Signed)
Patient ID: Virginia Bird, female   DOB: 05-12-1981, 41 y.o.   MRN: [id number]  Chief Complaint: Anal fissure  History of Present Illness Virginia Bird is a 41 y.o. female with an anal skin tag with associated degree of pain with defecation.  She has recently had banding of some internal hemorrhoids.  She reports her bowel movements are 1-2 times daily, she denies straining, she denies bleeding.  She does not avoid having a bowel movement because of the pain.  But there is some pain associated with each movement.  She has utilized nitroglycerin topically with some improvement though it does not appear to be sustained.  She presents for something more definitive and preventing her from having pain with her bowel activity.  She denies fevers and chills, she denies prior perianal or perirectal abscesses.  She denies itching burning and bleeding.  She denies constipation and diarrhea.  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 1 each 0   fluticasone (FLONASE) 50 MCG/ACT nasal spray Place 2 sprays into both nostrils daily. 16 g 0   hydrocortisone (ANUSOL-HC) 2.5 % rectal cream Place 1 application rectally 2 (two) times daily as needed for hemorrhoids or anal itching. 28 g 0   No current facility-administered medications for this visit.    Family History Family History  Problem Relation Age of Onset   Thyroid disease Mother    Hypertension Mother    Cancer  Father        leukemia   Stroke Father    Cancer Maternal Aunt        Breast Cancer   Breast cancer Maternal Aunt    Asthma Sister    Miscarriages / [place] Sister       Social History Social History   Tobacco Use   Smoking status: Every Day    Packs/day: 0.50    Years: 10.00    Pack years: 5.00    Types: Cigarettes   Smokeless tobacco: Never   Tobacco comments:    Has not tried any quit aids/tn  Vaping Use   Vaping Use: Never used  Substance Use Topics   Alcohol use: Yes    Alcohol/week: 0.0 standard drinks    Comment: weekends   Drug use: No       Review of Systems  Constitutional: Negative.   HENT: Negative.    Eyes: Negative.   Respiratory: Negative.    Cardiovascular: Negative.   Gastrointestinal: Negative.   Genitourinary: Negative.   Skin: Negative.   Neurological: Negative.   Psychiatric/Behavioral: Negative.       Physical Exam Blood pressure 115/80, pulse 80, temperature 98.3 F (36.8 C), temperature source Oral, height 5\' 1"  (1.549 m), weight 156 lb 12.8 oz (71.1 kg), SpO2 100 %. Last Weight  Most recent update: 10/24/2021  9:51 AM    Weight  71.1 kg (156 lb 12.8 oz)  CONSTITUTIONAL: Well developed, and nourished, appropriately responsive and aware without distress.   EYES: Sclera non-icteric.   EARS, NOSE, MOUTH AND THROAT: Mask worn.     Hearing is intact to voice.  NECK: Trachea is midline, and there is no jugular venous distension.  LYMPH NODES:  Lymph nodes in the neck are not enlarged. RESPIRATORY:  Lungs are clear, and breath sounds are equal bilaterally. Normal respiratory effort without pathologic use of accessory muscles. CARDIOVASCULAR: Heart is regular in rate and rhythm. GI: The abdomen is soft, nontender, and nondistended.   GU: [name] is present as chaperone.  With the patient in a kneeling position.  A digital rectal exam is completed.  She has an external tag that appears very consistent with a hemorrhoidal  skin tag however it appears it may have ulcerated more proximally.  It is not in the 12 or 6 o'clock position in fact it is 10:00 on the patient's posterior left.  In the 12 o'clock position there appears to be more scarring consistent with banded hemorrhoids?  She does have some discomfort and she does have some voluntary spasm/high level sphincter tone as a result.  I am not convinced she has hypertrophy of her internal sphincter causing the problem.  There seems to be very little additional redundancy of water otherwise mild circumferential internal hemorrhoids.  MUSCULOSKELETAL:  Symmetrical muscle tone appreciated in all four extremities.    SKIN: Skin turgor is normal. No pathologic skin lesions appreciated.  NEUROLOGIC:  Motor and sensation appear grossly normal.  Cranial nerves are grossly without defect. PSYCH:  Alert and oriented to person, place and time. Affect is appropriate for situation.  Data Reviewed I have personally reviewed what is currently available of the patient's imaging, recent labs and medical records.   Labs:  CBC Latest Ref Rng & Units 08/23/2021 04/04/2021 12/03/2020  WBC 4.0 - 10.5 K/uL 8.6 7.9 8.6  Hemoglobin 12.0 - 15.0 g/dL 11.5(L) 11.4(L) 10.4(L)  Hematocrit 36.0 - 46.0 % 36.0 35.3(L) 33.6(L)  Platelets 150 - 400 K/uL 408(H) 365 412(H)   CMP Latest Ref Rng & Units 03/05/2021 11/08/2019 05/28/2018  Glucose 70 - 99 mg/dL 88 92 94  BUN 6 - 23 mg/dL 7 7 8   Creatinine 0.40 - 1.20 mg/dL 0.70 0.72 0.70  Sodium 135 - 145 mEq/L 140 138 139  Potassium 3.5 - 5.1 mEq/L 3.5 3.5 3.5  Chloride 96 - 112 mEq/L 106 102 106  CO2 19 - 32 mEq/L 25 29 25   Calcium 8.4 - 10.5 mg/dL 8.6 8.9 9.1  Total Protein 6.0 - 8.3 g/dL 7.2 7.0 7.7  Total Bilirubin 0.2 - 1.2 mg/dL 0.2 0.4 0.3  Alkaline Phos 39 - 117 U/L 47 56 48  AST 0 - 37 U/L 14 13 13   ALT 0 - 35 U/L 10 8 11       Imaging:  Within last 24 hrs: No results found.  Assessment    Ulcerated external hemorrhoidal tag.   Possible anal fissure. 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  We discussed the role of  pursuing relaxation of the internal sphincter either chemically, or with surgery.  I am in agreement we should not be quite so aggressive on this first evaluation as her pain may be primarily due to an ulcerated external hemorrhoidal tag. We reviewed the risks of anal surgery, including stenosis, incontinence, soilage, pain, infection, anesthesia etc.  Face-to-face time spent with the patient and accompanying care providers(if present) was 40 minutes, with more than 50% of the time spent counseling, educating, and coordinating care of the patient.    These notes generated with voice recognition software. I apologize for typographical errors.  Ronny Bacon M.D., FACS 10/24/2021, 3:42 PM

## 2021-10-24 NOTE — Telephone Encounter (Signed)
Patient has been advised of Pre-Admission date/time, COVID Testing date and Surgery date.  Surgery Date: 11/08/21 Preadmission Testing Date: 10/31/21 (phone 8a-1p) Covid Testing Date: Not needed.    Patient has been made aware to call [phone], between 1-3:00pm the day before surgery, to find out what time to arrive for surgery.

## 2021-10-24 NOTE — Patient Instructions (Addendum)
Our surgery scheduler will call you within 24-48 hours to schedule your surgery. Please have the [facility] surgery sheet available when speaking with her.   Please use a Fleets enema the night before your procedure.   Hemorrhoids Hemorrhoids are swollen veins in and around the rectum or anus. There are two types of hemorrhoids: Internal hemorrhoids. These occur in the veins that are just inside the rectum. They may poke through to the outside and become irritated and painful. External hemorrhoids. These occur in the veins that are outside the anus and can be felt as a painful swelling or hard lump near the anus. Most hemorrhoids do not cause serious problems, and they can be managed with home treatments such as diet and lifestyle changes. If home treatments do not help the symptoms, procedures can be done to shrink or remove the hemorrhoids. What are the causes? This condition is caused by increased pressure in the anal area. This pressure may result from various things, including: Constipation. Straining to have a bowel movement. Diarrhea. Pregnancy. Obesity. Sitting for long periods of time. Heavy lifting or other activity that causes you to strain. Anal sex. Riding a bike for a long period of time. What are the signs or symptoms? Symptoms of this condition include: Pain. Anal itching or irritation. Rectal bleeding. Leakage of stool (feces). Anal swelling. One or more lumps around the anus. How is this diagnosed? This condition can often be diagnosed through a visual exam. Other exams or tests may also be done, such as: An exam that involves feeling the rectal area with a gloved hand (digital rectal exam). An exam of the anal canal that is done using a small tube (anoscope). A blood test, if you have lost a significant amount of blood. A test to look inside the colon using a flexible tube with a camera on the end (sigmoidoscopy or colonoscopy). How is this treated? This condition can  usually be treated at home. However, various procedures may be done if dietary changes, lifestyle changes, and other home treatments do not help your symptoms. These procedures can help make the hemorrhoids smaller or remove them completely. Some of these procedures involve surgery, and others do not. Common procedures include: Rubber band ligation. Rubber bands are placed at the base of the hemorrhoids to cut off their blood supply. Sclerotherapy. Medicine is injected into the hemorrhoids to shrink them. Infrared coagulation. A type of light energy is used to get rid of the hemorrhoids. Hemorrhoidectomy surgery. The hemorrhoids are surgically removed, and the veins that supply them are tied off. Stapled hemorrhoidopexy surgery. The surgeon staples the base of the hemorrhoid to the rectal wall. Follow these instructions at home: Eating and drinking  Eat foods that have a lot of fiber in them, such as whole grains, beans, nuts, fruits, and vegetables. Ask your health care provider about taking products that have added fiber (fiber supplements). Reduce the amount of fat in your diet. You can do this by eating low-fat dairy products, eating less red meat, and avoiding processed foods. Drink enough fluid to keep your urine pale yellow. Managing pain and swelling  Take warm sitz baths for 20 minutes, 3-4 times a day to ease pain and discomfort. You may do this in a bathtub or using a portable sitz bath that fits over the toilet. If directed, apply ice to the affected area. Using ice packs between sitz baths may be helpful. Put ice in a plastic bag. Place a towel between your skin and  the bag. Leave the ice on for 20 minutes, 2-3 times a day. General instructions Take over-the-counter and prescription medicines only as told by your health care provider. Use medicated creams or suppositories as told. Get regular exercise. Ask your health care provider how much and what kind of exercise is best for  you. In general, you should do moderate exercise for at least 30 minutes on most days of the week (150 minutes each week). This can include activities such as walking, biking, or yoga. Go to the bathroom when you have the urge to have a bowel movement. Do not wait. Avoid straining to have bowel movements. Keep the anal area dry and clean. Use wet toilet paper or moist towelettes after a bowel movement. Do not sit on the toilet for long periods of time. This increases blood pooling and pain. Keep all follow-up visits as told by your health care provider. This is important. Contact a health care provider if you have: Increasing pain and swelling that are not controlled by treatment or medicine. Difficulty having a bowel movement, or you are unable to have a bowel movement. Pain or inflammation outside the area of the hemorrhoids. Get help right away if you have: Uncontrolled bleeding from your rectum. Summary Hemorrhoids are swollen veins in and around the rectum or anus. Most hemorrhoids can be managed with home treatments such as diet and lifestyle changes. Taking warm sitz baths can help ease pain and discomfort. In severe cases, procedures or surgery can be done to shrink or remove the hemorrhoids. This information is not intended to replace advice given to you by your health care provider. Make sure you discuss any questions you have with your health care provider. Document Revised: 03/13/2021 Document Reviewed: 03/13/2021 Elsevier Patient Education  [place].

## 2021-10-29 ENCOUNTER — Ambulatory Visit: Payer: Self-pay | Admitting: Surgery

## 2021-10-29 DIAGNOSIS — K644 Residual hemorrhoidal skin tags: Secondary | ICD-10-CM

## 2021-10-31 ENCOUNTER — Inpatient Hospital Stay
Admission: RE | Admit: 2021-10-31 | Discharge: 2021-10-31 | Disposition: A | Discharge status: Home / Self Care | Payer: BC Managed Care – PPO | Source: Ambulatory Visit | Attending: Surgery | Admitting: Surgery

## 2021-10-31 NOTE — Patient Instructions (Signed)
Your procedure is scheduled on: 11/08/21 - Friday Report to the Registration Desk on the 1st floor of the [facility]. To find out your arrival time, please call [phone] between 1PM - 3PM on: 11/07/21 - Thursday  REMEMBER: Instructions that are not followed completely may result in serious medical risk, up to and including death; or upon the discretion of your surgeon and anesthesiologist your surgery may need to be rescheduled.  Do not eat food or drink any fluids after midnight the night before surgery.  No gum chewing, lozengers or hard candies.   TAKE THESE MEDICATIONS THE MORNING OF SURGERY WITH A SIP OF WATER:  none  One week prior to surgery: Stop Anti-inflammatories (NSAIDS) such as Advil, Aleve, Ibuprofen, Motrin, Naproxen, Naprosyn and Aspirin based products such as Excedrin, Goodys Powder, BC Powder.  Stop ANY OVER THE COUNTER supplements until after surgery.  You may however, continue to take Tylenol if needed for pain up until the day of surgery.  No Alcohol for 24 hours before or after surgery.  No Smoking including e-cigarettes for 24 hours prior to surgery.  No chewable tobacco products for at least 6 hours prior to surgery.  No nicotine patches on the day of surgery.  Do not use any "recreational" drugs for at least a week prior to your surgery.  Please be advised that the combination of cocaine and anesthesia may have negative outcomes, up to and including death. If you test positive for cocaine, your surgery will be cancelled.  On the morning of surgery brush your teeth with toothpaste and water, you may rinse your mouth with mouthwash if you wish. Do not swallow any toothpaste or mouthwash.  Do not wear jewelry, make-up, hairpins, clips or nail polish.  Do not wear lotions, powders, or perfumes.   Do not shave body from the neck down 48 hours prior to surgery just in case you cut yourself which could leave a site for infection.  Also, freshly shaved  skin may become irritated if using the CHG soap.  Contact lenses, hearing aids and dentures may not be worn into surgery.  Do not bring valuables to the hospital. [facility] is not responsible for any missing/lost belongings or valuables.   Fleets enema or bowel prep as directed.  Notify your doctor if there is any change in your medical condition (cold, fever, infection).  Wear comfortable clothing (specific to your surgery type) to the hospital.  After surgery, you can help prevent lung complications by doing breathing exercises.  Take deep breaths and cough every 1-2 hours. Your doctor may order a device called an Incentive Spirometer to help you take deep breaths. When coughing or sneezing, hold a pillow firmly against your incision with both hands. This is called splinting. Doing this helps protect your incision. It also decreases belly discomfort.  If you are being admitted to the hospital overnight, leave your suitcase in the car. After surgery it may be brought to your room.  If you are being discharged the day of surgery, you will not be allowed to drive home. You will need a responsible adult (18 years or older) to drive you home and stay with you that night.   If you are taking public transportation, you will need to have a responsible adult (18 years or older) with you. Please confirm with your physician that it is acceptable to use public transportation.   Please call the [place] [facility] at [phone] if you have any questions  about these instructions.  Surgery Visitation Policy:  Patients undergoing a surgery or procedure may have one family member or support person with them as long as that person is not COVID-19 positive or experiencing its symptoms.  That person may remain in the waiting area during the procedure and may rotate out with other people.  Inpatient Visitation:    Visiting hours are 7 a.m. to 8 p.m. Up to two visitors ages 16+ are  allowed at one time in a patient room. The visitors may rotate out with other people during the day. Visitors must check out when they leave, or other visitors will not be allowed. One designated support person may remain overnight. The visitor must pass COVID-19 screenings, use hand sanitizer when entering and exiting the patients room and wear a mask at all times, including in the patients room. Patients must also wear a mask when staff or their visitor are in the room. Masking is required regardless of vaccination status.

## 2021-11-01 ENCOUNTER — Inpatient Hospital Stay: Admission: RE | Admit: 2021-11-01 | Payer: BC Managed Care – PPO | Source: Ambulatory Visit

## 2021-11-04 ENCOUNTER — Other Ambulatory Visit: Payer: Self-pay | Admitting: Primary Care

## 2021-11-04 DIAGNOSIS — Z1231 Encounter for screening mammogram for malignant neoplasm of breast: Secondary | ICD-10-CM

## 2021-11-05 ENCOUNTER — Other Ambulatory Visit
Admission: RE | Admit: 2021-11-05 | Discharge: 2021-11-05 | Disposition: A | Discharge status: Home / Self Care | Payer: BC Managed Care – PPO | Source: Ambulatory Visit | Attending: Surgery | Admitting: Surgery

## 2021-11-05 ENCOUNTER — Other Ambulatory Visit: Payer: Self-pay

## 2021-11-05 NOTE — Patient Instructions (Addendum)
Your procedure is scheduled on: 11/08/21 - Friday Report to the Registration Desk on the 1st floor of the [facility]. To find out your arrival time, please call [phone] between 1PM - 3PM on: 11/07/21 - Thursday  REMEMBER: Instructions that are not followed completely may result in serious medical risk, up to and including death; or upon the discretion of your surgeon and anesthesiologist your surgery may need to be rescheduled.  Do not eat food or drink any fluids after midnight the night before surgery.  No gum chewing, lozengers or hard candies.  TAKE THESE MEDICATIONS THE MORNING OF SURGERY WITH A SIP OF WATER: NONE  One week prior to surgery: Stop Anti-inflammatories (NSAIDS) such as Advil, Aleve, Ibuprofen, Motrin, Naproxen, Naprosyn and Aspirin based products such as Excedrin, Goodys Powder, BC Powder.  Stop ANY OVER THE COUNTER supplements until after surgery.  You may take Tylenol if needed for pain up until the day of surgery.  No Alcohol for 24 hours before or after surgery.  No Smoking including e-cigarettes for 24 hours prior to surgery.  No chewable tobacco products for at least 6 hours prior to surgery.  No nicotine patches on the day of surgery.  Do not use any "recreational" drugs for at least a week prior to your surgery.  Please be advised that the combination of cocaine and anesthesia may have negative outcomes, up to and including death. If you test positive for cocaine, your surgery will be cancelled.  On the morning of surgery brush your teeth with toothpaste and water, you may rinse your mouth with mouthwash if you wish. Do not swallow any toothpaste or mouthwash.  Do not wear jewelry, make-up, hairpins, clips or nail polish.  Do not wear lotions, powders, or perfumes.   Do not shave body from the neck down 48 hours prior to surgery just in case you cut yourself which could leave a site for infection.  Also, freshly shaved skin may become irritated  if using the CHG soap.  Contact lenses, hearing aids and dentures may not be worn into surgery.  Do not bring valuables to the hospital. [facility] is not responsible for any missing/lost belongings or valuables.   Notify your doctor if there is any change in your medical condition (cold, fever, infection).  Wear comfortable clothing (specific to your surgery type) to the hospital.  After surgery, you can help prevent lung complications by doing breathing exercises.  Take deep breaths and cough every 1-2 hours. Your doctor may order a device called an Incentive Spirometer to help you take deep breaths. When coughing or sneezing, hold a pillow firmly against your incision with both hands. This is called splinting. Doing this helps protect your incision. It also decreases belly discomfort.  If you are being admitted to the hospital overnight, leave your suitcase in the car. After surgery it may be brought to your room.  If you are being discharged the day of surgery, you will not be allowed to drive home. You will need a responsible adult (18 years or older) to drive you home and stay with you that night.   If you are taking public transportation, you will need to have a responsible adult (18 years or older) with you. Please confirm with your physician that it is acceptable to use public transportation.   Please call the [place] [facility] at [phone] if you have any questions about these instructions.  Surgery Visitation Policy:  Patients undergoing a surgery or  procedure may have one family member or support person with them as long as that person is not COVID-19 positive or experiencing its symptoms.  That person may remain in the waiting area during the procedure and may rotate out with other people.  Inpatient Visitation:    Visiting hours are 7 a.m. to 8 p.m. Up to two visitors ages 16+ are allowed at one time in a patient room. The visitors may rotate out with  other people during the day. Visitors must check out when they leave, or other visitors will not be allowed. One designated support person may remain overnight. The visitor must pass COVID-19 screenings, use hand sanitizer when entering and exiting the patients room and wear a mask at all times, including in the patients room. Patients must also wear a mask when staff or their visitor are in the room. Masking is required regardless of vaccination status.

## 2021-11-06 ENCOUNTER — Other Ambulatory Visit: Payer: BC Managed Care – PPO

## 2021-11-06 ENCOUNTER — Inpatient Hospital Stay
Admission: RE | Admit: 2021-11-06 | Discharge: 2021-11-06 | Disposition: A | Discharge status: Home / Self Care | Payer: BC Managed Care – PPO | Source: Ambulatory Visit

## 2021-11-06 HISTORY — DX: Anemia, unspecified: D64.9

## 2021-11-08 ENCOUNTER — Ambulatory Visit: Payer: BC Managed Care – PPO | Admitting: Anesthesiology

## 2021-11-08 ENCOUNTER — Other Ambulatory Visit: Payer: Self-pay

## 2021-11-08 ENCOUNTER — Encounter: Payer: Self-pay | Admitting: Surgery

## 2021-11-08 ENCOUNTER — Encounter: Payer: Self-pay | Admitting: Oncology

## 2021-11-08 ENCOUNTER — Encounter
Admission: RE | Disposition: A | Discharge status: Home / Self Care | Payer: Self-pay | Source: Home / Self Care | Attending: Surgery

## 2021-11-08 ENCOUNTER — Encounter: Payer: Self-pay | Admitting: Primary Care

## 2021-11-08 ENCOUNTER — Ambulatory Visit
Admission: RE | Admit: 2021-11-08 | Discharge: 2021-11-08 | Disposition: A | Discharge status: Home / Self Care | Payer: BC Managed Care – PPO | Attending: Surgery | Admitting: Surgery

## 2021-11-08 DIAGNOSIS — Z8711 Personal history of peptic ulcer disease: Secondary | ICD-10-CM | POA: Diagnosis not present

## 2021-11-08 DIAGNOSIS — K644 Residual hemorrhoidal skin tags: Secondary | ICD-10-CM | POA: Diagnosis not present

## 2021-11-08 DIAGNOSIS — Z8619 Personal history of other infectious and parasitic diseases: Secondary | ICD-10-CM | POA: Diagnosis not present

## 2021-11-08 DIAGNOSIS — K602 Anal fissure, unspecified: Secondary | ICD-10-CM | POA: Insufficient documentation

## 2021-11-08 DIAGNOSIS — D649 Anemia, unspecified: Secondary | ICD-10-CM | POA: Insufficient documentation

## 2021-11-08 DIAGNOSIS — F17210 Nicotine dependence, cigarettes, uncomplicated: Secondary | ICD-10-CM | POA: Diagnosis not present

## 2021-11-08 DIAGNOSIS — K649 Unspecified hemorrhoids: Secondary | ICD-10-CM | POA: Diagnosis not present

## 2021-11-08 HISTORY — PX: RECTAL EXAM UNDER ANESTHESIA: SHX6399

## 2021-11-08 HISTORY — PX: HEMORRHOID SURGERY: SHX153

## 2021-11-08 LAB — POCT PREGNANCY, URINE: Preg Test, Ur: NEGATIVE

## 2021-11-08 SURGERY — EXAM UNDER ANESTHESIA, RECTUM
Anesthesia: General

## 2021-11-08 MED ORDER — FAMOTIDINE 20 MG PO TABS
ORAL_TABLET | ORAL | Status: AC
  Administered 2021-11-08: 20 mg via ORAL
  Filled 2021-11-08: qty 1

## 2021-11-08 MED ORDER — ESMOLOL HCL 100 MG/10ML IV SOLN
INTRAVENOUS | Status: DC | PRN
  Administered 2021-11-08 (×2): 20 mg via INTRAVENOUS

## 2021-11-08 MED ORDER — MIDAZOLAM HCL 2 MG/2ML IJ SOLN
INTRAMUSCULAR | Status: AC
  Filled 2021-11-08: qty 2

## 2021-11-08 MED ORDER — BUPIVACAINE LIPOSOME 1.3 % IJ SUSP
INTRAMUSCULAR | Status: AC
  Filled 2021-11-08: qty 20

## 2021-11-08 MED ORDER — CELECOXIB 200 MG PO CAPS
ORAL_CAPSULE | ORAL | Status: AC
  Administered 2021-11-08: 200 mg via ORAL
  Filled 2021-11-08: qty 1

## 2021-11-08 MED ORDER — HYDROMORPHONE HCL 1 MG/ML IJ SOLN
INTRAMUSCULAR | Status: AC
  Filled 2021-11-08: qty 1

## 2021-11-08 MED ORDER — ACETAMINOPHEN 500 MG PO TABS: 1000.0000 mg | ORAL_TABLET | ORAL | Status: AC

## 2021-11-08 MED ORDER — FLEET ENEMA 7-19 GM/118ML RE ENEM: 1.0000 | ENEMA | Freq: Once | RECTAL | Status: DC

## 2021-11-08 MED ORDER — ONDANSETRON HCL 4 MG/2ML IJ SOLN: 4.0000 mg | Freq: Once | INTRAMUSCULAR | Status: DC | PRN

## 2021-11-08 MED ORDER — MIDAZOLAM HCL 2 MG/2ML IJ SOLN
INTRAMUSCULAR | Status: DC | PRN
  Administered 2021-11-08: 2 mg via INTRAVENOUS

## 2021-11-08 MED ORDER — ONDANSETRON HCL 4 MG/2ML IJ SOLN
INTRAMUSCULAR | Status: AC
  Filled 2021-11-08: qty 2

## 2021-11-08 MED ORDER — HYDROMORPHONE HCL 1 MG/ML IJ SOLN
INTRAMUSCULAR | Status: DC | PRN
  Administered 2021-11-08: .5 mg via INTRAVENOUS

## 2021-11-08 MED ORDER — BUPIVACAINE LIPOSOME 1.3 % IJ SUSP
INTRAMUSCULAR | Status: DC | PRN
  Administered 2021-11-08: 13 mL

## 2021-11-08 MED ORDER — FAMOTIDINE 20 MG PO TABS: 20.0000 mg | ORAL_TABLET | Freq: Once | ORAL | Status: AC

## 2021-11-08 MED ORDER — LIDOCAINE HCL (PF) 2 % IJ SOLN
INTRAMUSCULAR | Status: AC
  Filled 2021-11-08: qty 5

## 2021-11-08 MED ORDER — CELECOXIB 200 MG PO CAPS: 200.0000 mg | ORAL_CAPSULE | ORAL | Status: AC

## 2021-11-08 MED ORDER — GABAPENTIN 300 MG PO CAPS: 300.0000 mg | ORAL_CAPSULE | ORAL | Status: AC

## 2021-11-08 MED ORDER — IBUPROFEN 800 MG PO TABS: 800.0000 mg | ORAL_TABLET | Freq: Three times a day (TID) | ORAL | 0 refills | Status: DC | PRN

## 2021-11-08 MED ORDER — FENTANYL CITRATE (PF) 100 MCG/2ML IJ SOLN
INTRAMUSCULAR | Status: AC
  Filled 2021-11-08: qty 2

## 2021-11-08 MED ORDER — PROPOFOL 10 MG/ML IV BOLUS
INTRAVENOUS | Status: DC | PRN
  Administered 2021-11-08: 50 mg via INTRAVENOUS
  Administered 2021-11-08: 150 mg via INTRAVENOUS

## 2021-11-08 MED ORDER — DEXAMETHASONE SODIUM PHOSPHATE 10 MG/ML IJ SOLN
INTRAMUSCULAR | Status: AC
  Filled 2021-11-08: qty 1

## 2021-11-08 MED ORDER — CHLORHEXIDINE GLUCONATE CLOTH 2 % EX PADS
6.0000 | MEDICATED_PAD | Freq: Once | CUTANEOUS | Status: AC
  Administered 2021-11-08: 6 via TOPICAL

## 2021-11-08 MED ORDER — FENTANYL CITRATE (PF) 100 MCG/2ML IJ SOLN: 25.0000 ug | INTRAMUSCULAR | Status: DC | PRN

## 2021-11-08 MED ORDER — CHLORHEXIDINE GLUCONATE 0.12 % MT SOLN: 15.0000 mL | Freq: Once | OROMUCOSAL | Status: AC

## 2021-11-08 MED ORDER — BUPIVACAINE LIPOSOME 1.3 % IJ SUSP: 20.0000 mL | Freq: Once | INTRAMUSCULAR | Status: DC

## 2021-11-08 MED ORDER — GELATIN ABSORBABLE 100 CM EX MISC
CUTANEOUS | Status: AC
  Filled 2021-11-08: qty 1

## 2021-11-08 MED ORDER — GABAPENTIN 300 MG PO CAPS
ORAL_CAPSULE | ORAL | Status: AC
  Administered 2021-11-08: 300 mg via ORAL
  Filled 2021-11-08: qty 1

## 2021-11-08 MED ORDER — ROCURONIUM BROMIDE 100 MG/10ML IV SOLN
INTRAVENOUS | Status: DC | PRN
  Administered 2021-11-08: 30 mg via INTRAVENOUS

## 2021-11-08 MED ORDER — OXYCODONE HCL 5 MG PO TABS: 5.0000 mg | ORAL_TABLET | Freq: Four times a day (QID) | ORAL | 0 refills | Status: DC | PRN

## 2021-11-08 MED ORDER — PROPOFOL 10 MG/ML IV BOLUS
INTRAVENOUS | Status: AC
  Filled 2021-11-08: qty 20

## 2021-11-08 MED ORDER — LACTATED RINGERS IV SOLN: INTRAVENOUS | Status: DC

## 2021-11-08 MED ORDER — SUGAMMADEX SODIUM 200 MG/2ML IV SOLN
INTRAVENOUS | Status: DC | PRN
  Administered 2021-11-08: 100 mg via INTRAVENOUS
  Administered 2021-11-08: 200 mg via INTRAVENOUS

## 2021-11-08 MED ORDER — DEXAMETHASONE SODIUM PHOSPHATE 10 MG/ML IJ SOLN
INTRAMUSCULAR | Status: DC | PRN
  Administered 2021-11-08: 10 mg via INTRAVENOUS

## 2021-11-08 MED ORDER — ESMOLOL HCL 100 MG/10ML IV SOLN
INTRAVENOUS | Status: AC
  Filled 2021-11-08: qty 10

## 2021-11-08 MED ORDER — FENTANYL CITRATE (PF) 100 MCG/2ML IJ SOLN
INTRAMUSCULAR | Status: DC | PRN
  Administered 2021-11-08 (×2): 50 ug via INTRAVENOUS

## 2021-11-08 MED ORDER — KETOROLAC TROMETHAMINE 30 MG/ML IJ SOLN
INTRAMUSCULAR | Status: AC
  Filled 2021-11-08: qty 1

## 2021-11-08 MED ORDER — DEXMEDETOMIDINE (PRECEDEX) IN NS 20 MCG/5ML (4 MCG/ML) IV SYRINGE
PREFILLED_SYRINGE | INTRAVENOUS | Status: AC
  Filled 2021-11-08: qty 5

## 2021-11-08 MED ORDER — BUPIVACAINE-EPINEPHRINE (PF) 0.25% -1:200000 IJ SOLN
INTRAMUSCULAR | Status: AC
  Filled 2021-11-08: qty 30

## 2021-11-08 MED ORDER — LIDOCAINE HCL (CARDIAC) PF 100 MG/5ML IV SOSY
PREFILLED_SYRINGE | INTRAVENOUS | Status: DC | PRN
  Administered 2021-11-08: 100 mg via INTRAVENOUS

## 2021-11-08 MED ORDER — CHLORHEXIDINE GLUCONATE 0.12 % MT SOLN
OROMUCOSAL | Status: AC
  Administered 2021-11-08: 15 mL via OROMUCOSAL
  Filled 2021-11-08: qty 15

## 2021-11-08 MED ORDER — BUPIVACAINE-EPINEPHRINE 0.25% -1:200000 IJ SOLN
INTRAMUSCULAR | Status: DC | PRN
Start: 2021-11-08 — End: 2021-11-08
  Administered 2021-11-08: 20 mL

## 2021-11-08 MED ORDER — ACETAMINOPHEN 500 MG PO TABS
ORAL_TABLET | ORAL | Status: AC
  Administered 2021-11-08: 1000 mg via ORAL
  Filled 2021-11-08: qty 2

## 2021-11-08 MED ORDER — ORAL CARE MOUTH RINSE: 15.0000 mL | Freq: Once | OROMUCOSAL | Status: AC

## 2021-11-08 SURGICAL SUPPLY — 32 items
BLADE SURG 15 STRL LF DISP TIS (BLADE) ×2 IMPLANT
BLADE SURG 15 STRL SS (BLADE) ×2
BRIEF STRETCH FOR OB PAD XXL (UNDERPADS AND DIAPERS) ×3 IMPLANT
DRAPE LAPAROTOMY 100X77 ABD (DRAPES) ×3 IMPLANT
DRAPE LEGGINS SURG 28X43 STRL (DRAPES) ×2 IMPLANT
DRSG GAUZE FLUFF 36X18 (GAUZE/BANDAGES/DRESSINGS) ×2 IMPLANT
ELECT CAUTERY BLADE TIP 2.5 (TIP) ×2
ELECT REM PT RETURN 9FT ADLT (ELECTROSURGICAL) ×2
ELECTRODE CAUTERY BLDE TIP 2.5 (TIP) ×2 IMPLANT
ELECTRODE REM PT RTRN 9FT ADLT (ELECTROSURGICAL) ×2 IMPLANT
GAUZE 4X4 16PLY ~~LOC~~+RFID DBL (SPONGE) ×2 IMPLANT
GLOVE SURG ORTHO LTX SZ7.5 (GLOVE) ×12 IMPLANT
GOWN STRL REUS W/ TWL LRG LVL3 (GOWN DISPOSABLE) ×4 IMPLANT
GOWN STRL REUS W/TWL LRG LVL3 (GOWN DISPOSABLE) ×12
KIT TURNOVER KIT A (KITS) ×3 IMPLANT
MANIFOLD NEPTUNE II (INSTRUMENTS) ×3 IMPLANT
NDL SAFETY ECLIPSE 18X1.5 (NEEDLE) ×2 IMPLANT
NEEDLE HYPO 18GX1.5 SHARP (NEEDLE) ×2
NEEDLE HYPO 22GX1.5 SAFETY (NEEDLE) ×3 IMPLANT
PACK BASIN MINOR ARMC (MISCELLANEOUS) ×3 IMPLANT
PAD ABD DERMACEA PRESS 5X9 (GAUZE/BANDAGES/DRESSINGS) IMPLANT
SHEARS HARMONIC 9CM CVD (BLADE) IMPLANT
SOL PREP PVP 2OZ (MISCELLANEOUS) ×2
SOLUTION PREP PVP 2OZ (MISCELLANEOUS) ×2 IMPLANT
SURGILUBE 2OZ TUBE FLIPTOP (MISCELLANEOUS) ×3 IMPLANT
SUT CHROMIC 3 0 SH 27 (SUTURE) ×3 IMPLANT
SUT PROLENE 2 0 SH DA (SUTURE) IMPLANT
SUT PROLENE 3 0 SH DA (SUTURE) IMPLANT
SWABSTK COMLB BENZOIN TINCTURE (MISCELLANEOUS) ×3 IMPLANT
SYR 10ML LL (SYRINGE) ×3 IMPLANT
TAPE CLOTH 3X10 WHT NS LF (GAUZE/BANDAGES/DRESSINGS) ×4 IMPLANT
WATER STERILE IRR 500ML POUR (IV SOLUTION) ×2 IMPLANT

## 2021-11-08 NOTE — Anesthesia Preprocedure Evaluation (Signed)
Anesthesia Evaluation  Patient identified by MRN, date of birth, ID band Patient awake    Reviewed: Allergy & Precautions, H&P , NPO status , Patient's Chart, lab work & pertinent test results, reviewed documented beta blocker date and time   History of Anesthesia Complications Negative for: history of anesthetic complications  Airway Mallampati: I  TM Distance: >3 FB Neck ROM: full    Dental  (+) Dental Advidsory Given, Teeth Intact   Pulmonary neg shortness of breath, neg COPD, neg recent URI, Current Smoker,    Pulmonary exam normal breath sounds clear to auscultation       Cardiovascular Exercise Tolerance: Good negative cardio ROS Normal cardiovascular exam Rhythm:regular Rate:Normal     Neuro/Psych negative neurological ROS  negative psych ROS   GI/Hepatic Neg liver ROS, PUD, neg GERD  ,  Endo/Other  negative endocrine ROS  Renal/GU negative Renal ROS  negative genitourinary   Musculoskeletal   Abdominal   Peds  Hematology negative hematology ROS (+)   Anesthesia Other Findings Past Medical History: No date: Abnormal Pap smear of cervix No date: Anemia No date: Chickenpox No date: UTI (urinary tract infection)   Reproductive/Obstetrics negative OB ROS                             Anesthesia Physical Anesthesia Plan  ASA: 1  Anesthesia Plan: General   Post-op Pain Management:    Induction: Intravenous  PONV Risk Score and Plan: 2 and Ondansetron, Dexamethasone, Midazolam and Treatment may vary due to age or medical condition  Airway Management Planned: LMA and Oral ETT  Additional Equipment:   Intra-op Plan:   Post-operative Plan: Extubation in OR  Informed Consent: I have reviewed the patients History and Physical, chart, labs and discussed the procedure including the risks, benefits and alternatives for the proposed anesthesia with the patient or authorized  representative who has indicated his/her understanding and acceptance.     Dental Advisory Given  Plan Discussed with: Anesthesiologist, CRNA and Surgeon  Anesthesia Plan Comments:         Anesthesia Quick Evaluation

## 2021-11-08 NOTE — Op Note (Signed)
SURGICAL OPERATIVE REPORT   DATE OF PROCEDURE: 11/08/2021  ATTENDING Surgeon(s): Ronny Bacon, MD  ANESTHESIA: GETA  PRE-OPERATIVE DIAGNOSIS: Symptomatic (painful) external hemorrhoids with symptoms not well-controlled despite medical management   POST-OPERATIVE DIAGNOSIS: Same  PROCEDURE(S):  1.) Anoscopy 2.) Simple hemorrhoidectomy Left posterior external.   INTRAOPERATIVE FINDINGS: Posterior left external hemorrhoids ulcer and skin tags  ESTIMATED BLOOD LOSS: 10 mL  URINE OUTPUT: No foley   SPECIMENS: Left posterior external, and skin tag(s) x 2  COMPLICATIONS: None apparent   CONDITION AT END OF PROCEDURE: Hemodynamically stable and extubated   DISPOSITION OF PATIENT: PACU   DETAILS OF PROCEDURE: Patient was brought to the operative suite and appropriately identified. General  anesthesia was administered along with appropriate pre-operative antibiotics, and endotracheal intubation was performed by anesthetist.  Repositioned to prone/jackknife and buttocks taped for exposure, during which a linear skin tear occurred of the posterior peri-anal skin, the injury was epidermal in depth.  Then the operative site was prepped and draped in the usual sterile fashion, and following a brief time out, rigid anoscopy was performed with findings of no distal rectal masses, normal mucosa, or prominent areas. Local anesthetic quarter percent Marcaine with epinephrine was injected, and the two external hemorrhoids with their tags were excised with electrosurgery. An Alice clamp was placed near the base of each pedicle near the dentate line and retracted externally to exteriorize the hemorrhoidal pedicle. Retracted hemorrhoid was carefully dissected from underlying/adherent tissues, and The underlying internal sphincteric muscle fibers were preserved.  Hemostasis was achieved/confirmed. Fluffs, ABD, and mesh briefs were applied.  Patient was then safely able to be extubated, awakened, and  transferred to PACU for post-operative monitoring and care.  Ronny Bacon, M.D., [facility]  11/08/2021 ; 9:57 AM

## 2021-11-08 NOTE — Anesthesia Postprocedure Evaluation (Signed)
Anesthesia Post Note  Patient: Virginia Bird  Procedure(s) Performed: RECTAL EXAM UNDER ANESTHESIA HEMORRHOIDECTOMY  Patient location during evaluation: PACU Anesthesia Type: General Level of consciousness: awake and alert Pain management: pain level controlled Vital Signs Assessment: post-procedure vital signs reviewed and stable Respiratory status: spontaneous breathing, nonlabored ventilation and respiratory function stable Cardiovascular status: blood pressure returned to baseline and stable Postop Assessment: no apparent nausea or vomiting Anesthetic complications: no   No notable events documented.   Last Vitals:  Vitals:   11/08/21 1044 11/08/21 1045  BP: 125/86 123/87  Pulse: 69 68  Resp: 18 17  Temp:  (!) 36.1 C  SpO2: 97% 95%    Last Pain:  Vitals:   11/08/21 0848  TempSrc: Temporal                 Iran Ouch

## 2021-11-08 NOTE — Anesthesia Procedure Notes (Signed)
Procedure Name: Intubation Date/Time: 11/08/2021 9:15 AM Performed by: Loletha Grayer, CRNA Pre-anesthesia Checklist: Patient identified, Patient being monitored, Timeout performed, Emergency Drugs available and Suction available Patient Re-evaluated:Patient Re-evaluated prior to induction Oxygen Delivery Method: Circle system utilized Preoxygenation: Pre-oxygenation with 100% oxygen Induction Type: IV induction Ventilation: Mask ventilation without difficulty Laryngoscope Size: Mac and 3 Grade View: Grade I Tube type: Oral Tube size: 7.0 mm Number of attempts: 1 Airway Equipment and Method: Stylet Placement Confirmation: ETT inserted through vocal cords under direct vision, positive ETCO2 and breath sounds checked- equal and bilateral Secured at: 21 cm Tube secured with: Tape Dental Injury: Teeth and Oropharynx as per pre-operative assessment

## 2021-11-08 NOTE — Discharge Instructions (Signed)

## 2021-11-08 NOTE — Interval H&P Note (Signed)
History and Physical Interval Note:  11/08/2021 8:43 AM  Virginia Bird  has presented today for surgery, with the diagnosis of hemorrhoids,anal fissure.  The various methods of treatment have been discussed with the patient and family. After consideration of risks, benefits and other options for treatment, the patient has consented to  Procedure(s): RECTAL EXAM UNDER ANESTHESIA (N/A) HEMORRHOIDECTOMY (N/A) as a surgical intervention.  The patient's history has been reviewed, patient examined, no change in status, stable for surgery.  I have reviewed the patient's chart and labs.  Questions were answered to the patient's satisfaction.   She did not get enema, but reports utilizing a suppository this AM.  Ronny Bacon

## 2021-11-08 NOTE — OR Nursing (Signed)
As Dr. Christian Mate was taping the buttocks apart, a small tear appeared on the right side of the rectum in prone position.

## 2021-11-08 NOTE — Transfer of Care (Signed)
Immediate Anesthesia Transfer of Care Note  Patient: Virginia Bird  Procedure(s) Performed: RECTAL EXAM UNDER ANESTHESIA HEMORRHOIDECTOMY  Patient Location: PACU  Anesthesia Type:General  Level of Consciousness: awake and drowsy  Airway & Oxygen Therapy: Patient Spontanous Breathing and Patient connected to face mask oxygen  Post-op Assessment: Report given to RN and Post -op Vital signs reviewed and stable  Post vital signs: Reviewed and stable  Last Vitals:  Vitals Value Taken Time  BP 130/81 11/08/21 1002  Temp    Pulse 86 11/08/21 1002  Resp 17 11/08/21 1002  SpO2 96 % 11/08/21 1002    Last Pain:  Vitals:   11/08/21 0848  TempSrc: Temporal         Complications: No notable events documented.

## 2021-11-09 ENCOUNTER — Encounter: Payer: Self-pay | Admitting: Surgery

## 2021-11-11 LAB — SURGICAL PATHOLOGY

## 2021-11-13 ENCOUNTER — Encounter: Payer: BC Managed Care – PPO | Admitting: Primary Care

## 2021-11-21 ENCOUNTER — Encounter: Payer: Self-pay | Admitting: Surgery

## 2021-11-21 ENCOUNTER — Other Ambulatory Visit: Payer: Self-pay

## 2021-11-21 ENCOUNTER — Ambulatory Visit (INDEPENDENT_AMBULATORY_CARE_PROVIDER_SITE_OTHER): Payer: BC Managed Care – PPO | Admitting: Surgery

## 2021-11-21 VITALS — BP 121/81 | HR 80 | Temp 98.5°F | Ht 61.0 in | Wt 157.4 lb

## 2021-11-21 DIAGNOSIS — Z9889 Other specified postprocedural states: Secondary | ICD-10-CM

## 2021-11-21 DIAGNOSIS — Z09 Encounter for follow-up examination after completed treatment for conditions other than malignant neoplasm: Secondary | ICD-10-CM

## 2021-11-21 DIAGNOSIS — Z8719 Personal history of other diseases of the digestive system: Secondary | ICD-10-CM

## 2021-11-21 DIAGNOSIS — K644 Residual hemorrhoidal skin tags: Secondary | ICD-10-CM

## 2021-11-21 HISTORY — DX: Personal history of other diseases of the digestive system: Z87.19

## 2021-11-21 NOTE — Progress Notes (Signed)
[facility] ?POST-OP OFFICE VISIT ? ?11/21/2021 ? ?HPI: ?Virginia Bird is a 41 y.o. female 13 days s/p limited hemorrhoidectomy, with rectal exam under anesthesia.  She reports she is doing well, seeing very little blood, having little to no pain with bowel activity.  Denies fevers and chills. ? ?Vital signs: ?BP 121/81   Pulse 80   Temp 98.5 ?F (36.9 ?C) (Oral)   Ht '5\' 1"'$  (1.549 m)   Wt 157 lb 6.4 oz (71.4 kg)   LMP 10/22/2021 (Approximate)   SpO2 98%   BMI 29.74 kg/m?   ? ?Physical Exam: ?Constitutional: She appears well ?Exam deferred. ? ?Assessment/Plan: ?This is a 41 y.o. female 13 days s/p simple hemorrhoidectomy ? ?Patient Active Problem List  ? Diagnosis Date Noted  ? External ulcerated hemorrhoids 10/24/2021  ? Hemorrhoids 11/27/2020  ? COVID-19 virus infection 10/05/2020  ? Environmental and seasonal allergies 05/23/2020  ? Iron deficiency anemia 11/12/2019  ? Fibroids 09/09/2018  ? 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 Ovarian cyst 01/27/2012  ? ? -We discussed that we would not schedule routine follow-up, but she may return as new issues arise.  We discussed fiber supplementation.  And gave her information as noted below. ?Advised to pursue a goal of 25 to 30 g of fiber daily.  Made aware that the majority of this may be through natural sources, but advised to be aware of actual consumption and to ensure minimal consumption by daily supplementation.  Various forms of supplements discussed.  Recommended Psyllium husk, that mixes well with applesauce, or the powder which goes down well shaken with chocolate milk.  ?Strongly advised to consume more fluids to ensure adequate hydration, instructed to watch color of urine to determine adequacy of hydration.  Clarity is pursued in urine output, and bowel activity that correlates to significant meal intake.   ?We need to  avoid deferring having bowel movements, advised to take the time at the first sign of sensation, typically following meals, and in the morning.   ?Subsequent utilization of MiraLAX may be needed ensure at least daily movement, ideally twice daily bowel movements.  If multiple doses of MiraLAX are necessary utilize them. ?Never skip a day...  ?To be regular, we must do the above EVERY day.   ? ? ?Ronny Bacon M.D., FACS ?11/21/2021, 9:52 AM  ?

## 2021-11-21 NOTE — Patient Instructions (Addendum)
Please call the office if you have questions or concerns. ? ? ?Advised to pursue a goal of 25 to 30 g of fiber daily.  Made aware that the majority of this may be through natural sources, but advised to be aware of actual consumption and to ensure minimal consumption by daily supplementation.  Various forms of supplements discussed.  Recommended Psyllium husk, that mixes well with applesauce, or the powder which goes down well shaken with chocolate milk.  ?Strongly advised to consume more fluids to ensure adequate hydration, instructed to watch color of urine to determine adequacy of hydration.  Clarity is pursued in urine output, and bowel activity that correlates to significant meal intake.   ?We need to avoid deferring having bowel movements, advised to take the time at the first sign of sensation, typically following meals, and in the morning.   ?Subsequent utilization of MiraLAX may be needed ensure at least daily movement, ideally twice daily bowel movements.  If multiple doses of MiraLAX are necessary utilize them. ?Never skip a day...  ?To be regular, we must do the above EVERY day.  ? ?

## 2021-12-13 ENCOUNTER — Ambulatory Visit (INDEPENDENT_AMBULATORY_CARE_PROVIDER_SITE_OTHER): Payer: BC Managed Care – PPO | Admitting: Primary Care

## 2021-12-13 ENCOUNTER — Encounter: Payer: Self-pay | Admitting: Primary Care

## 2021-12-13 VITALS — BP 126/82 | HR 97 | Temp 98.6°F | Ht 61.0 in | Wt 158.0 lb

## 2021-12-13 DIAGNOSIS — Z0000 Encounter for general adult medical examination without abnormal findings: Secondary | ICD-10-CM | POA: Diagnosis not present

## 2021-12-13 DIAGNOSIS — D509 Iron deficiency anemia, unspecified: Secondary | ICD-10-CM | POA: Diagnosis not present

## 2021-12-13 DIAGNOSIS — Z9889 Other specified postprocedural states: Secondary | ICD-10-CM

## 2021-12-13 DIAGNOSIS — Z91018 Allergy to other foods: Secondary | ICD-10-CM

## 2021-12-13 DIAGNOSIS — J019 Acute sinusitis, unspecified: Secondary | ICD-10-CM | POA: Diagnosis not present

## 2021-12-13 DIAGNOSIS — J3089 Other allergic rhinitis: Secondary | ICD-10-CM | POA: Diagnosis not present

## 2021-12-13 DIAGNOSIS — B9689 Other specified bacterial agents as the cause of diseases classified elsewhere: Secondary | ICD-10-CM

## 2021-12-13 DIAGNOSIS — Z8719 Personal history of other diseases of the digestive system: Secondary | ICD-10-CM

## 2021-12-13 LAB — COMPREHENSIVE METABOLIC PANEL
ALT: 9 U/L (ref 0–35)
AST: 11 U/L (ref 0–37)
Albumin: 3.7 g/dL (ref 3.5–5.2)
Alkaline Phosphatase: 52 U/L (ref 39–117)
BUN: 8 mg/dL (ref 6–23)
CO2: 26 mEq/L (ref 19–32)
Calcium: 9.2 mg/dL (ref 8.4–10.5)
Chloride: 104 mEq/L (ref 96–112)
Creatinine, Ser: 0.69 mg/dL (ref 0.40–1.20)
GFR: 106.87 mL/min (ref >60.00)
Glucose, Bld: 88 mg/dL (ref 70–99)
Potassium: 3.9 mEq/L (ref 3.5–5.1)
Sodium: 137 mEq/L (ref 135–145)
Total Bilirubin: 0.3 mg/dL (ref 0.2–1.2)
Total Protein: 6.7 g/dL (ref 6.0–8.3)

## 2021-12-13 LAB — LIPID PANEL
Cholesterol: 140 mg/dL (ref 0–200)
HDL: 59.3 mg/dL (ref >39.00)
LDL Cholesterol: 65 mg/dL (ref 0–99)
NonHDL: 81.06
Total CHOL/HDL Ratio: 2
Triglycerides: 82 mg/dL (ref 0.0–149.0)
VLDL: 16.4 mg/dL (ref 0.0–40.0)

## 2021-12-13 LAB — CBC
HCT: 35.9 % — ABNORMAL LOW (ref 36.0–46.0)
Hemoglobin: 11.4 g/dL — ABNORMAL LOW (ref 12.0–15.0)
MCHC: 31.6 g/dL (ref 30.0–36.0)
MCV: 80.8 fl (ref 78.0–100.0)
Platelets: 345 10*3/uL (ref 150.0–400.0)
RBC: 4.44 Mil/uL (ref 3.87–5.11)
RDW: 15.6 % — ABNORMAL HIGH (ref 11.5–15.5)
WBC: 7.2 10*3/uL (ref 4.0–10.5)

## 2021-12-13 LAB — IBC + FERRITIN
Ferritin: 8.9 ng/mL — ABNORMAL LOW (ref 10.0–291.0)
Iron: 23 ug/dL — ABNORMAL LOW (ref 42–145)
Saturation Ratios: 6.3 % — ABNORMAL LOW (ref 20.0–50.0)
TIBC: 365.4 ug/dL (ref 250.0–450.0)
Transferrin: 261 mg/dL (ref 212.0–360.0)

## 2021-12-13 MED ORDER — FLUTICASONE PROPIONATE 50 MCG/ACT NA SUSP: 1.0000 | Freq: Two times a day (BID) | NASAL | 3 refills | Status: DC

## 2021-12-13 NOTE — Progress Notes (Signed)
? ?Subjective:  ? ? Patient ID: Virginia Bird, female    DOB: May 12, 1981, 41 y.o.   MRN: [id number] ? ?HPI ? ?Virginia EDELEN is a very pleasant 41 y.o. female who presents today for complete physical and follow up of chronic conditions. ? ?Immunizations: ?-Tetanus: Over 10 years, declines  ?-Influenza: Does not complete ?-Covid-19: Has not completed ? ?Diet: Fair diet.  ?Exercise: No regular exercise. ? ?Eye exam: Completes annually  ?Dental exam: Completes semi-annually  ? ?Pap Smear: Completed in 2021 ?Mammogram: Completed in April 2022, scheduled for April 2023 ? ?BP Readings from Last 3 Encounters:  ?12/13/21 126/82  ?11/21/21 121/81  ?11/08/21 123/87  ? ? ?Wt Readings from Last 3 Encounters:  ?12/13/21 158 lb (71.7 kg)  ?11/21/21 157 lb 6.4 oz (71.4 kg)  ?11/08/21 156 lb (70.8 kg)  ? ? ? ? ? ?Review of Systems  ?Constitutional:  Negative for unexpected weight change.  ?HENT:  Negative for rhinorrhea.   ?Respiratory:  Negative for shortness of breath.   ?Cardiovascular:  Negative for chest pain.  ?Gastrointestinal:  Negative for constipation and diarrhea.  ?Genitourinary:  Positive for menstrual problem. Negative for difficulty urinating.  ?Musculoskeletal:  Negative for arthralgias and myalgias.  ?Skin:  Negative for rash.  ?Allergic/Immunologic: Positive for environmental allergies.  ?Neurological:  Negative for dizziness, numbness and headaches.  ?Psychiatric/Behavioral:  The patient is not nervous/anxious.   ? ?   ? ? ?Past Medical History:  ?Diagnosis Date  ? Abnormal Pap smear of cervix   ? Anemia   ? Chickenpox   ? COVID-19 virus infection 10/05/2020  ? UTI (urinary tract infection)   ? ? ?Social History  ? ?Socioeconomic History  ? Marital status: Single  ?  Spouse name: Not on file  ? Number of children: 1  ? Years of education: Not on file  ? Highest education level: Not on file  ?Occupational History  ? Not on file  ?Tobacco Use  ? Smoking status: Every Day  ?  Packs/day: 0.50  ?  Years: 10.00   ?  Pack years: 5.00  ?  Types: Cigarettes  ? Smokeless tobacco: Never  ? Tobacco comments:  ?  Has not tried any quit aids/tn  ?Vaping Use  ? Vaping Use: Never used  ?Substance and Sexual Activity  ? Alcohol use: Yes  ?  Alcohol/week: 0.0 standard drinks  ?  Comment: weekends  ? Drug use: No  ? Sexual activity: Yes  ?  Partners: Male  ?Other Topics Concern  ? Not on file  ?Social History Narrative  ? Single.   ? 1 daughter.   ? Works at [employer].  ? Enjoys going to [employer].   ? ?Social Determinants of Health  ? ?Financial Resource Strain: Not on file  ?Food Insecurity: Not on file  ?Transportation Needs: Not on file  ?Physical Activity: Not on file  ?Stress: Not on file  ?Social Connections: Not on file  ?Intimate Partner Violence: Not on file  ? ? ?Past Surgical History:  ?Procedure Laterality Date  ? DILATION AND CURETTAGE OF UTERUS  09/15/2000  ? 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 HEMORRHOID SURGERY N/A 11/08/2021  ? Procedure: HEMORRHOIDECTOMY;  Surgeon: Ronny Bacon, MD;  Location: [facility] ORS;  Service: General;  Laterality: N/A;  ? RECTAL EXAM UNDER ANESTHESIA N/A 11/08/2021  ? Procedure: RECTAL EXAM UNDER ANESTHESIA;  Surgeon: Ronny Bacon, MD;  Location: [facility] ORS;  Service: General;  Laterality: N/A;  ?  WISDOM TOOTH EXTRACTION    ? ? ?Family History  ?Problem Relation Age of Onset  ? Thyroid disease Mother   ? Hypertension Mother   ? Cancer Father   ?     leukemia  ? Stroke Father   ? Cancer Maternal Aunt   ?     Breast Cancer  ? Breast cancer Maternal Aunt   ? Asthma Sister   ? Miscarriages / Stillbirths Sister   ? ? ?No Known Allergies ? ?Current Outpatient Medications on File Prior to Visit  ?Medication Sig Dispense Refill  ? EPINEPHrine 0.3 mg/0.3 mL IJ SOAJ injection Inject 0.3 mg into the muscle as needed for anaphylaxis. 1 each 0  ? ?No current facility-administered medications on file prior to visit.  ? ? ?BP 126/82   Pulse 97   Temp 98.6 ?F (37 ?C) (Oral)    Ht '5\' 1"'$  (1.549 m)   Wt 158 lb (71.7 kg)   SpO2 97%   BMI 29.85 kg/m?  ?Objective:  ? Physical Exam ?HENT:  ?   Right Ear: Tympanic membrane and ear canal normal.  ?   Left Ear: Tympanic membrane and ear canal normal.  ?   Nose: Nose normal.  ?Eyes:  ?   Conjunctiva/sclera: Conjunctivae normal.  ?   Pupils: Pupils are equal, round, and reactive to light.  ?Neck:  ?   Thyroid: No thyromegaly.  ?Cardiovascular:  ?   Rate and Rhythm: Normal rate and regular rhythm.  ?   Heart sounds: No murmur heard. ?Pulmonary:  ?   Effort: Pulmonary effort is normal.  ?   Breath sounds: Normal breath sounds. No rales.  ?Abdominal:  ?   General: Bowel sounds are normal.  ?   Palpations: Abdomen is soft.  ?   Tenderness: There is no abdominal tenderness.  ?Musculoskeletal:     ?   General: Normal range of motion.  ?   Cervical back: Neck supple.  ?Lymphadenopathy:  ?   Cervical: No cervical adenopathy.  ?Skin: ?   General: Skin is warm and dry.  ?   Findings: No rash.  ?Neurological:  ?   Mental Status: She is alert and oriented to person, place, and time.  ?   Cranial Nerves: No cranial nerve deficit.  ?   Deep Tendon Reflexes: Reflexes are normal and symmetric.  ?Psychiatric:     ?   Mood and Affect: Mood normal.  ? ? ? ? ? ?   ?Assessment & Plan:  ? ? ? ? ?This visit occurred during the SARS-CoV-2 public health emergency.  Safety protocols were in place, including screening questions prior to the visit, additional usage of staff PPE, and extensive cleaning of exam room while observing appropriate contact time as indicated for disinfecting solutions.  ?

## 2021-12-13 NOTE — Assessment & Plan Note (Signed)
Doing well post operatively.  ? ?Follow up notes reviewed from general surgery from March 2023. ?

## 2021-12-13 NOTE — Patient Instructions (Signed)
Stop by the lab prior to leaving today. I will notify you of your results once received.  ? ?It was a pleasure to see you today! ? ?Preventive Care 40-43 Years Old, Female ?Preventive care refers to lifestyle choices and visits with your health care provider that can promote health and wellness. Preventive care visits are also called wellness exams. ?What can I expect for my preventive care visit? ?Counseling ?Your health care provider may ask you questions about your: ?Medical history, including: ?Past medical problems. ?Family medical history. ?Pregnancy history. ?Current health, including: ?Menstrual cycle. ?Method of birth control. ?Emotional well-being. ?Home life and relationship well-being. ?Sexual activity and sexual health. ?Lifestyle, including: ?Alcohol, nicotine or tobacco, and drug use. ?Access to firearms. ?Diet, exercise, and sleep habits. ?Work and work environment. ?Sunscreen use. ?Safety issues such as seatbelt and bike helmet use. ?Physical exam ?Your health care provider will check your: ?Height and weight. These may be used to calculate your BMI (body mass index). BMI is a measurement that tells if you are at a healthy weight. ?Waist circumference. This measures the distance around your waistline. This measurement also tells if you are at a healthy weight and may help predict your risk of certain diseases, such as type 2 diabetes and high blood pressure. ?Heart rate and blood pressure. ?Body temperature. ?Skin for abnormal spots. ?What immunizations do I need? ?Vaccines are usually given at various ages, according to a schedule. Your health care provider will recommend vaccines for you based on your age, medical history, and lifestyle or other factors, such as travel or where you work. ?What tests do I need? ?Screening ?Your health care provider may recommend screening tests for certain conditions. This may include: ?Lipid and cholesterol levels. ?Diabetes screening. This is done by checking  your blood sugar (glucose) after you have not eaten for a while (fasting). ?Pelvic exam and Pap test. ?Hepatitis B test. ?Hepatitis C test. ?HIV (human immunodeficiency virus) test. ?STI (sexually transmitted infection) testing, if you are at risk. ?Lung cancer screening. ?Colorectal cancer screening. ?Mammogram. Talk with your health care provider about when you should start having regular mammograms. This may depend on whether you have a family history of breast cancer. ?BRCA-related cancer screening. This may be done if you have a family history of breast, ovarian, tubal, or peritoneal cancers. ?Bone density scan. This is done to screen for osteoporosis. ?Talk with your health care provider about your test results, treatment options, and if necessary, the need for more tests. ?Follow these instructions at home: ?Eating and drinking ? ?Eat a diet that includes fresh fruits and vegetables, whole grains, lean protein, and low-fat dairy products. ?Take vitamin and mineral supplements as recommended by your health care provider. ?Do not drink alcohol if: ?Your health care provider tells you not to drink. ?You are pregnant, may be pregnant, or are planning to become pregnant. ?If you drink alcohol: ?Limit how much you have to 0-1 drink a day. ?Know how much alcohol is in your drink. In the [place] one drink equals one 12 oz bottle of beer (355 mL), one 5 oz glass of wine (148 mL), or one 1? oz glass of hard liquor (44 mL). ?Lifestyle ?Brush your teeth every morning and night with fluoride toothpaste. Floss one time each day. ?Exercise for at least 30 minutes 5 or more days each week. ?Do not use any products that contain nicotine or tobacco. These products include cigarettes, chewing tobacco, and vaping devices, such as e-cigarettes. If you need help   quitting, ask your health care provider. ?Do not use drugs. ?If you are sexually active, practice safe sex. Use a condom or other form of protection to prevent STIs. ?If you  do not wish to become pregnant, use a form of birth control. If you plan to become pregnant, see your health care provider for a prepregnancy visit. ?Take aspirin only as told by your health care provider. Make sure that you understand how much to take and what form to take. Work with your health care provider to find out whether it is safe and beneficial for you to take aspirin daily. ?Find healthy ways to manage stress, such as: ?Meditation, yoga, or listening to music. ?Journaling. ?Talking to a trusted person. ?Spending time with friends and family. ?Minimize exposure to UV radiation to reduce your risk of skin cancer. ?Safety ?Always wear your seat belt while driving or riding in a vehicle. ?Do not drive: ?If you have been drinking alcohol. Do not ride with someone who has been drinking. ?When you are tired or distracted. ?While texting. ?If you have been using any mind-altering substances or drugs. ?Wear a helmet and other protective equipment during sports activities. ?If you have firearms in your house, make sure you follow all gun safety procedures. ?Seek help if you have been physically or sexually abused. ?What's next? ?Visit your health care provider once a year for an annual wellness visit. ?Ask your health care provider how often you should have your eyes and teeth checked. ?Stay up to date on all vaccines. ?This information is not intended to replace advice given to you by your health care provider. Make sure you discuss any questions you have with your health care provider. ?Document Revised: 02/27/2021 Document Reviewed: 02/27/2021 ?Elsevier Patient Education ? 2022 Elsevier Inc. ? ?

## 2021-12-13 NOTE — Assessment & Plan Note (Signed)
Ongoing abnormal uterine bleeding. ?Follows with GYN. ? ?She is taking oral iron once weekly. ? ?Previously following with hematology, no visit since July 2022. Reviewed notes from July 2022. ? ?Repeat iron studies pending. If her levels remain low she will follow up for iron infusions. ?

## 2021-12-13 NOTE — Assessment & Plan Note (Signed)
Seasonal. ?Overall controlled. ? ?Continue Flonase nasal spray. ?Continue Zyrtec '10mg'$  PRN. ?Refills provided.  ?

## 2021-12-13 NOTE — Assessment & Plan Note (Signed)
Declines tetanus vaccine. ?Pap smear UTD, follows with GYN. ?Mammogram scheduled for next month. ? ?Discussed the importance of a healthy diet and regular exercise in order for weight loss, and to reduce the risk of further co-morbidity. ? ?Exam today stable. ?Labs pending.  ?

## 2021-12-13 NOTE — Assessment & Plan Note (Signed)
No use of Epi pen. ? ?Continue to monitor.  ?

## 2022-01-15 ENCOUNTER — Ambulatory Visit
Admission: RE | Admit: 2022-01-15 | Discharge: 2022-01-15 | Disposition: A | Discharge status: Home / Self Care | Payer: BC Managed Care – PPO | Source: Ambulatory Visit | Attending: Primary Care | Admitting: Primary Care

## 2022-01-15 DIAGNOSIS — Z1231 Encounter for screening mammogram for malignant neoplasm of breast: Secondary | ICD-10-CM | POA: Insufficient documentation

## 2022-03-11 ENCOUNTER — Ambulatory Visit (INDEPENDENT_AMBULATORY_CARE_PROVIDER_SITE_OTHER): Payer: BC Managed Care – PPO | Admitting: Obstetrics & Gynecology

## 2022-03-11 ENCOUNTER — Encounter: Payer: Self-pay | Admitting: Obstetrics & Gynecology

## 2022-03-11 ENCOUNTER — Other Ambulatory Visit (HOSPITAL_COMMUNITY)
Admission: RE | Admit: 2022-03-11 | Discharge: 2022-03-11 | Disposition: A | Discharge status: Home / Self Care | Payer: BC Managed Care – PPO | Source: Ambulatory Visit | Attending: Obstetrics & Gynecology | Admitting: Obstetrics & Gynecology

## 2022-03-11 VITALS — BP 118/79 | HR 102 | Wt 155.4 lb

## 2022-03-11 DIAGNOSIS — Z113 Encounter for screening for infections with a predominantly sexual mode of transmission: Secondary | ICD-10-CM | POA: Insufficient documentation

## 2022-03-11 DIAGNOSIS — Z01419 Encounter for gynecological examination (general) (routine) without abnormal findings: Secondary | ICD-10-CM

## 2022-03-11 DIAGNOSIS — N760 Acute vaginitis: Secondary | ICD-10-CM | POA: Insufficient documentation

## 2022-03-11 DIAGNOSIS — D219 Benign neoplasm of connective and other soft tissue, unspecified: Secondary | ICD-10-CM | POA: Diagnosis not present

## 2022-03-11 DIAGNOSIS — D508 Other iron deficiency anemias: Secondary | ICD-10-CM | POA: Diagnosis not present

## 2022-03-11 DIAGNOSIS — B9689 Other specified bacterial agents as the cause of diseases classified elsewhere: Secondary | ICD-10-CM

## 2022-03-11 DIAGNOSIS — N920 Excessive and frequent menstruation with regular cycle: Secondary | ICD-10-CM | POA: Diagnosis not present

## 2022-03-11 MED ORDER — NAPROXEN 500 MG PO TABS: 500.0000 mg | ORAL_TABLET | Freq: Two times a day (BID) | ORAL | 2 refills | Status: DC

## 2022-03-11 MED ORDER — TRANEXAMIC ACID 650 MG PO TABS: 1300.0000 mg | ORAL_TABLET | Freq: Three times a day (TID) | ORAL | 2 refills | Status: DC

## 2022-03-11 NOTE — Progress Notes (Signed)
GYNECOLOGY ANNUAL PREVENTATIVE CARE ENCOUNTER NOTE  History:     Virginia Bird is a 41 y.o. G64P1011 female here for a routine annual gynecologic exam.  Current complaints: iron deficiency anemia due to heavy menstrual bleeding in the setting of known fibroids.  She wants to know what her levels are and recommended management. Feels tired, no presyncope.   Denies abnormal vaginal discharge, pelvic pain, problems with intercourse or other gynecologic concerns.    Gynecologic History Patient's last menstrual period was 03/02/2022 (exact date). Contraception: condoms Last Pap: 12/06/2019. Result was normal with negative HPV Last Mammogram: 01/15/2022.  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 16 g 3   EPINEPHrine 0.3 mg/0.3 mL IJ SOAJ injection Inject 0.3 mg into the muscle as needed  for anaphylaxis. 1 each 0   No current facility-administered medications on file prior to visit.    No Known Allergies  Social History:  reports that she has been smoking cigarettes. She has a 2.50 pack-year smoking history. She has never used smokeless tobacco. She reports current alcohol use. She reports that she does not use drugs.  Family History  Problem Relation Age of Onset   Thyroid disease Mother    Hypertension Mother    Cancer Father        leukemia   Stroke Father    Cancer Maternal Aunt        Breast Cancer   Breast cancer Maternal Aunt    Asthma Sister    Miscarriages / [place] Sister     The following portions of the patient's history were reviewed and updated as appropriate: allergies, current medications, past family history, past medical history, past social history, past surgical history and problem list.  Review of Systems Pertinent items noted in HPI and remainder of comprehensive ROS otherwise negative.  Physical Exam:  BP 118/79   Pulse (!) 102   Wt 155 lb 6.4 oz (70.5 kg)   LMP 03/02/2022 (Exact Date)   BMI 29.36 kg/m  CONSTITUTIONAL: Well-developed, well-nourished female in no acute distress.  HENT:  Normocephalic, atraumatic, External right and left ear normal.  EYES: Conjunctivae  and EOM are normal. Pupils are equal, round, and reactive to light. No scleral icterus.  NECK: Normal range of motion, supple, no masses.  Normal thyroid.  SKIN: Skin is warm and dry. No rash noted. Not diaphoretic. No erythema. No pallor. MUSCULOSKELETAL: Normal range of motion. No tenderness.  No cyanosis, clubbing, or edema. NEUROLOGIC: Alert and oriented to person, place, and time. Normal reflexes, muscle tone coordination.  PSYCHIATRIC: Normal mood and affect. Normal behavior. Normal judgment and thought content. CARDIOVASCULAR: Normal heart rate noted, regular rhythm RESPIRATORY: Clear to auscultation bilaterally. Effort and breath sounds normal, no problems  with respiration noted. BREASTS: Symmetric in size. No masses, tenderness, skin changes, nipple drainage, or lymphadenopathy bilaterally. Performed in the presence of a chaperone. ABDOMEN: Soft, no distention noted.  No tenderness, rebound or guarding.  PELVIC: Normal appearing external genitalia and urethral meatus; normal appearing vaginal mucosa and cervix.  No abnormal vaginal discharge noted.  Pap smear obtained.  Enlarged fibroid uterus with mild tenderness to palpation, no other palpable masses, no adnexal tenderness.  Performed in the presence of a chaperone.   Assessment and Plan:     1. Menorrhagia with regular cycle 2. Fibroids Discussed medical management options for abnormal uterine bleeding including NSAIDs (Naproxen), tranexamic acid (Lysteda), hormonal/progestin therapy (pills, injection, IUD).  Will get ultrasound and that will determine possible surgical management options ([place], myomectomy, endometrial ablation or hysterectomy as definitive surgical management).  Patient desires Lysteda and Naproxen for now pending ultrasound results.  Printed patient education handouts were given to the patient to review at home.  Lysteda and Naproxen prescribed as needed for now,  bleeding precautions reviewed.  Will also check labs, ultrasound and manage accordingly. She will return in about one month to discuss results and further management. - US PELVIC COMPLETE WITH TRANSVAGINAL; Future - tranexamic acid (LYSTEDA) 650 MG TABS tablet; Take 2 tablets (1,300 mg total) by mouth 3 (three) times daily. Take during menses for a maximum of five days  Dispense: 30 tablet; Refill: 2 - naproxen (NAPROSYN) 500 MG tablet; Take 1 tablet (500 mg total) by mouth 2 (two) times daily with a meal. As needed for pain and during your periods  Dispense: 30 tablet; Refill: 2 - CBC - TSH  3. Iron deficiency anemia Labs checked, will follow up results and manage accordingly. - CBC - Iron, TIBC and Ferritin  Panel  4. Vaginitis and vulvovaginitis This testing was desired by patient. No symptoms. Will follow up results and manage accordingly. - Cervicovaginal ancillary only( [place])  5. Screening for STD (sexually transmitted disease) Patient desired STI screen today, will follow up results and manage accordingly. - Cervicovaginal ancillary only( [place]) - Hepatitis B surface antigen - Hepatitis C antibody - RPR - HIV Antibody (routine testing w rflx)  6. Well woman exam with routine gynecological exam - Cytology - PAP Will follow up results of pap smear and manage accordingly. Mammogram is up to date. Routine preventative health maintenance measures emphasized. Please refer to After Visit Summary for other counseling recommendations.      Jaynie Collins, MD, FACOG Obstetrician & Gynecologist, [facility] for [employer], [facility]

## 2022-03-12 ENCOUNTER — Ambulatory Visit: Payer: BC Managed Care – PPO

## 2022-03-12 LAB — CERVICOVAGINAL ANCILLARY ONLY
Bacterial Vaginitis (gardnerella): POSITIVE — AB
Candida Glabrata: NEGATIVE
Candida Vaginitis: NEGATIVE
Chlamydia: NEGATIVE
Comment: NEGATIVE
Comment: NEGATIVE
Comment: NEGATIVE
Comment: NEGATIVE
Comment: NEGATIVE
Comment: NORMAL
Neisseria Gonorrhea: NEGATIVE
Trichomonas: NEGATIVE

## 2022-03-12 LAB — CBC
Hematocrit: 32.8 % — ABNORMAL LOW (ref 34.0–46.6)
Hemoglobin: 10.2 g/dL — ABNORMAL LOW (ref 11.1–15.9)
MCH: 23.6 pg — ABNORMAL LOW (ref 26.6–33.0)
MCHC: 31.1 g/dL — ABNORMAL LOW (ref 31.5–35.7)
MCV: 76 fL — ABNORMAL LOW (ref 79–97)
Platelets: 552 10*3/uL — ABNORMAL HIGH (ref 150–450)
RBC: 4.33 x10E6/uL (ref 3.77–5.28)
RDW: 14.3 % (ref 11.7–15.4)
WBC: 10.5 10*3/uL (ref 3.4–10.8)

## 2022-03-12 LAB — IRON,TIBC AND FERRITIN PANEL
Ferritin: 24 ng/mL (ref 15–150)
Iron Saturation: 3 % — CL (ref 15–55)
Iron: 10 ug/dL — ABNORMAL LOW (ref 27–159)
Total Iron Binding Capacity: 306 ug/dL (ref 250–450)
UIBC: 296 ug/dL (ref 131–425)

## 2022-03-12 LAB — RPR: RPR Ser Ql: NONREACTIVE

## 2022-03-12 LAB — HEPATITIS B SURFACE ANTIGEN: Hepatitis B Surface Ag: NEGATIVE

## 2022-03-12 LAB — HIV ANTIBODY (ROUTINE TESTING W REFLEX): HIV Screen 4th Generation wRfx: NONREACTIVE

## 2022-03-12 LAB — TSH RFX ON ABNORMAL TO FREE T4: TSH: 1.62 u[IU]/mL (ref 0.450–4.500)

## 2022-03-12 LAB — HEPATITIS C ANTIBODY: Hep C Virus Ab: NONREACTIVE

## 2022-03-12 MED ORDER — FERROUS GLUCONATE 324 (38 FE) MG PO TABS: 324.0000 mg | ORAL_TABLET | Freq: Every day | ORAL | 3 refills | Status: DC

## 2022-03-12 MED ORDER — METRONIDAZOLE 500 MG PO TABS
500.0000 mg | ORAL_TABLET | Freq: Two times a day (BID) | ORAL | 0 refills | Status: AC
Start: 2022-03-12 — End: 2022-03-19

## 2022-03-12 NOTE — Addendum Note (Signed)
Addended by: Verita Schneiders A on: 03/12/2022 03:04 PM   Modules accepted: Orders

## 2022-03-14 LAB — CYTOLOGY - PAP
Comment: NEGATIVE
Diagnosis: UNDETERMINED — AB
High risk HPV: NEGATIVE

## 2022-03-24 ENCOUNTER — Telehealth: Payer: Self-pay

## 2022-03-24 ENCOUNTER — Ambulatory Visit: Payer: BC Managed Care – PPO

## 2022-03-24 NOTE — Telephone Encounter (Signed)
Pt called in stating she needed to reschedule her u/s appointment due to cycle starting and she was uncomfortable getting it done. Notified pt to click on the appt in her mychart to get information on rescheduling her ultrasound. Pt disconnected call.

## 2022-04-03 ENCOUNTER — Ambulatory Visit
Admission: RE | Admit: 2022-04-03 | Discharge: 2022-04-03 | Disposition: A | Discharge status: Home / Self Care | Payer: BC Managed Care – PPO | Source: Ambulatory Visit | Attending: Obstetrics & Gynecology | Admitting: Obstetrics & Gynecology

## 2022-04-03 ENCOUNTER — Other Ambulatory Visit: Payer: Self-pay | Admitting: Obstetrics & Gynecology

## 2022-04-03 DIAGNOSIS — D219 Benign neoplasm of connective and other soft tissue, unspecified: Secondary | ICD-10-CM | POA: Diagnosis not present

## 2022-04-03 DIAGNOSIS — D259 Leiomyoma of uterus, unspecified: Secondary | ICD-10-CM | POA: Diagnosis not present

## 2022-04-03 DIAGNOSIS — N920 Excessive and frequent menstruation with regular cycle: Secondary | ICD-10-CM | POA: Insufficient documentation

## 2022-04-08 ENCOUNTER — Ambulatory Visit (INDEPENDENT_AMBULATORY_CARE_PROVIDER_SITE_OTHER): Payer: BC Managed Care – PPO | Admitting: Obstetrics & Gynecology

## 2022-04-08 ENCOUNTER — Encounter: Payer: Self-pay | Admitting: Obstetrics & Gynecology

## 2022-04-08 VITALS — BP 121/81 | HR 103 | Wt 153.0 lb

## 2022-04-08 DIAGNOSIS — D219 Benign neoplasm of connective and other soft tissue, unspecified: Secondary | ICD-10-CM

## 2022-04-08 DIAGNOSIS — N920 Excessive and frequent menstruation with regular cycle: Secondary | ICD-10-CM

## 2022-04-08 NOTE — Progress Notes (Signed)
GYNECOLOGY OFFICE VISIT NOTE  History:   Virginia Bird is a 41 y.o. G2P1011 here today for follow up after  03/11/22 evaluation and management of menorrhagia in the setting of fibroids.  She was prescribed Lysteda and Naproxen, reports this significantly reduced how much bleeding she had during last period.  Wants to discuss long term management . She denies any abnormal vaginal discharge, bleeding, elvic pain or other concerns.    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   Health Maintenance:  ASCUS pap and negative HRHPV on  03/11/22.  Normal mammogram on 01/17/2022.   Review of Systems:  Pertinent items noted in HPI and remainder of comprehensive ROS otherwise negative.  Physical Exam:  BP 121/81   Pulse (!) 103   Wt 153 lb (69.4 kg)   LMP 03/02/2022 (Exact Date)   BMI 28.91 kg/m  CONSTITUTIONAL: Well-developed, well-nourished female in no acute distress.  HEENT:  Normocephalic, atraumatic. External right and left ear normal. No scleral icterus.  NECK: Normal range of motion, supple, no masses noted on  observation SKIN: No rash noted. Not diaphoretic. No erythema. No pallor. MUSCULOSKELETAL: Normal range of motion. No edema noted. NEUROLOGIC: Alert and oriented to person, place, and time. Normal muscle tone coordination. No cranial nerve deficit noted. PSYCHIATRIC: Normal mood and affect. Normal behavior. Normal judgment and thought content. CARDIOVASCULAR: Normal heart rate noted RESPIRATORY: Effort and breath sounds normal, no problems with respiration noted ABDOMEN: No masses noted. No other overt distention noted.   PELVIC: Deferred  Labs and Imaging    Latest Ref Rng & Units 03/11/2022   12:14 PM 12/13/2021    9:03 AM 08/23/2021   12:48 PM  CBC  WBC 3.4 - 10.8 x10E3/uL 10.5  7.2  8.6   Hemoglobin 11.1 - 15.9 g/dL 10.2  11.4  11.5   Hematocrit 34.0 - 46.6 % 32.8  35.9  36.0   Platelets 150 - 450 x10E3/uL 552  345.0  408    Iron/TIBC/Ferritin/ %Sat    Component Value Date/Time   IRON 10 (L) 03/11/2022 1214   TIBC 306 03/11/2022 1214   FERRITIN 24 03/11/2022 1214   IRONPCTSAT 3 (LL) 03/11/2022 1214   03/11/22 TSH 1.620  US PELVIS (TRANSABDOMINAL ONLY)  Result Date: 04/04/2022 CLINICAL DATA:  Menorrhagia, fibroids. Last menstrual period 03/02/2022 EXAM: TRANSABDOMINAL ULTRASOUND OF PELVIS TECHNIQUE: Transabdominal ultrasound examination of the pelvis was performed including evaluation of the uterus, ovaries, adnexal regions, and pelvic cul-de-sac. COMPARISON:  Ultrasound pelvis 06/11/2018 FINDINGS: Uterus Measurements: 11.4 x 8.5 x 8 cm = volume: 403 mL. Multiple intramural/subserosal uterine  fibroids are noted with the largest measuring 6.6 x 6.1 x 6.7 cm located within the lower uterine segment. Endometrium Thickness: 11 mm. Hyperechoic vascular lesions noted within the endometrium measuring 2.5 x 2 x 3.6 cm. Second vascular mass within the endometrium measuring 1.9 x 1.2 x 2.7 cm. Right ovary Measurements: 3.8 x 2 x 3.1 cm = volume: 12.5 mL. Normal appearance/no adnexal mass. Left  ovary Measurements: 4.3 x 2.9 x 3.3 cm = volume: 21.8 mL. Normal appearance/no adnexal mass. Other findings:  No abnormal free fluid. IMPRESSION: 1. Vascular endometrial hyperechoic lesions that could represent submucosal fibroids versus polyp versus malignancy. Recommend gynecologic consultation for consideration of endometrial biopsy. 2. Enlarged uterus with multiple intramural/subserosal uterine fibroids are noted with the largest measuring 6.6 x 6.1 x 6.7 cm located within the lower uterine segment. 3. Please note transabdominal only ultrasound. Transvaginal ultrasound was not obtained. Electronically Signed   By: Iven Finn M.D.   On: 04/04/2022 19:59      Assessment and Plan:     1. Menorrhagia with regular cycle 2. Fibroids Discussed ultrasound and lab results with patient.  She is happy with her response to Lysteda and Naproxen, but wants more information about surgical management of her fibroids. Reports having pressure symptoms with the largest fibroid in the lower uterine segment.   She desires to preserve fertility. Discussed options of myomectomy, [place].  Risks and benefits reviewed, all questions answered. She is interested in [place], sent message to see if she can get in for consultation with Dr. Sabra Heck or Dr. Elgie Congo for consideration of this procedure. Also discussed [facility] as an alternative medical treatment, information given to her to review for this.  Will follow up with patient after her consultations; she will continue prescribed Lysteda and Naproxen for now.   Routine preventative health maintenance measures emphasized. Please refer to After Visit Summary for other counseling recommendations.   Return for any gynecologic concerns.    I spent 20 minutes dedicated to the care of this patient including pre-visit review of records, face to face time with the patient discussing her conditions and treatments and post visit orders.    Verita Schneiders, MD, [place] for [employer], [place]

## 2022-04-11 ENCOUNTER — Encounter: Payer: Self-pay | Admitting: Obstetrics & Gynecology

## 2022-05-08 ENCOUNTER — Telehealth: Payer: BC Managed Care – PPO | Admitting: Physician Assistant

## 2022-05-08 DIAGNOSIS — B9689 Other specified bacterial agents as the cause of diseases classified elsewhere: Secondary | ICD-10-CM | POA: Diagnosis not present

## 2022-05-08 DIAGNOSIS — J019 Acute sinusitis, unspecified: Secondary | ICD-10-CM | POA: Diagnosis not present

## 2022-05-08 MED ORDER — AMOXICILLIN-POT CLAVULANATE 875-125 MG PO TABS
1.0000 | ORAL_TABLET | Freq: Two times a day (BID) | ORAL | 0 refills | Status: DC
Start: 2022-05-08 — End: 2022-07-11

## 2022-05-08 NOTE — Progress Notes (Signed)
I have spent 5 minutes in review of e-visit questionnaire, review and updating patient chart, medical decision making and response to patient.   Ervin Rothbauer Cody Goldie Dimmer, PA-C    

## 2022-05-08 NOTE — Progress Notes (Signed)

## 2022-05-16 ENCOUNTER — Institutional Professional Consult (permissible substitution) (HOSPITAL_BASED_OUTPATIENT_CLINIC_OR_DEPARTMENT_OTHER): Payer: BC Managed Care – PPO | Admitting: Obstetrics & Gynecology

## 2022-06-03 ENCOUNTER — Other Ambulatory Visit: Payer: Self-pay | Admitting: Obstetrics & Gynecology

## 2022-06-03 DIAGNOSIS — N920 Excessive and frequent menstruation with regular cycle: Secondary | ICD-10-CM

## 2022-06-23 ENCOUNTER — Other Ambulatory Visit: Payer: Self-pay | Admitting: Primary Care

## 2022-06-23 DIAGNOSIS — J3089 Other allergic rhinitis: Secondary | ICD-10-CM

## 2022-06-23 DIAGNOSIS — B9689 Other specified bacterial agents as the cause of diseases classified elsewhere: Secondary | ICD-10-CM

## 2022-06-30 ENCOUNTER — Ambulatory Visit: Payer: BC Managed Care – PPO | Admitting: Family

## 2022-07-11 ENCOUNTER — Ambulatory Visit (INDEPENDENT_AMBULATORY_CARE_PROVIDER_SITE_OTHER): Payer: BC Managed Care – PPO | Admitting: Primary Care

## 2022-07-11 ENCOUNTER — Encounter: Payer: Self-pay | Admitting: Primary Care

## 2022-07-11 VITALS — BP 148/76 | HR 110 | Temp 96.8°F | Ht 61.0 in | Wt 153.0 lb

## 2022-07-11 DIAGNOSIS — N898 Other specified noninflammatory disorders of vagina: Secondary | ICD-10-CM | POA: Diagnosis not present

## 2022-07-11 DIAGNOSIS — R350 Frequency of micturition: Secondary | ICD-10-CM | POA: Diagnosis not present

## 2022-07-11 LAB — POC URINALSYSI DIPSTICK (AUTOMATED)
Bilirubin, UA: NEGATIVE
Blood, UA: POSITIVE
Glucose, UA: NEGATIVE
Ketones, UA: NEGATIVE
Leukocytes, UA: NEGATIVE
Nitrite, UA: NEGATIVE
Protein, UA: POSITIVE — AB
Spec Grav, UA: 1.02 (ref 1.010–1.025)
Urobilinogen, UA: 0.2 E.U./dL
pH, UA: 6 (ref 5.0–8.0)

## 2022-07-11 NOTE — Assessment & Plan Note (Signed)
Checking labs today including Wet Prep, gonorrhea, chlamydia. Await results.

## 2022-07-11 NOTE — Assessment & Plan Note (Signed)
UA today with 2+ blood, otherwise negative. Culture ordered and pending.  Await results.

## 2022-07-11 NOTE — Progress Notes (Signed)
Subjective:    Patient ID: Virginia Bird, female    DOB: 1981/08/14, 41 y.o.   MRN: [id number]  Vaginal Discharge The patient's primary symptoms include pelvic pain and vaginal discharge. Pertinent negatives include no abdominal pain, dysuria, fever, flank pain or hematuria.    Virginia Bird is a very pleasant 41 y.o. female with a history of ASCUS with HPV uterine fibroids, iron deficiency anemia who presents today to   Symptom onset with urinary frequency, vaginal discharge with odor, suprapubic pain. She thinks she has bacterial vaginosis.   She denies hematuria, dysuria, flank pain. She was diagnosed with BV last in June 2023, prescribed treatment but she didn't complete the course.   She is not menstruating but has been bleeding vaginally more frequently, is working with her GYN and is managed on Lysteda.   She was treated with an Augmentin course in August 2023 per E-Visit for an acute sinusitis.     Review of Systems  Constitutional:  Negative for fever.  Gastrointestinal:  Negative for abdominal pain.  Genitourinary:  Positive for pelvic pain and vaginal discharge. Negative for dysuria, flank pain and hematuria.         Past Medical History:  Diagnosis Date   Abnormal Pap smear of cervix    Anemia    Chickenpox    COVID-19 virus infection 10/05/2020   UTI (urinary tract infection)     Social History   Socioeconomic History   Marital status: Single    Spouse name: Not on file   Number of children: 1   Years of education: Not on file   Highest education level: Not on file  Occupational History   Not on file  Tobacco Use   Smoking status: Some Days    Packs/day: 0.25    Years: 10.00    Total pack years: 2.50    Types: Cigarettes   Smokeless tobacco: Never   Tobacco comments:    Has not tried any quit aids/tn  Vaping Use   Vaping Use: Never used  Substance and Sexual Activity   Alcohol use: Yes    Alcohol/week: 0.0 standard drinks of alcohol     Comment: weekends   Drug use: No   Sexual activity: Yes    Partners: Male  Other Topics Concern   Not on file  Social History Narrative   Single.    1 daughter.    Works at [employer].   Enjoys going to [employer].    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   HEMORRHOID SURGERY N/A 11/08/2021   Procedure: HEMORRHOIDECTOMY;  Surgeon: Ronny Bacon, MD;  Location: [facility] OR[facility];  Service: General;  Laterality: N/A;   RECTAL EXAM UNDER ANESTHESIA N/A 11/08/2021   Procedure: RECTAL EXAM UNDER ANESTHESIA;  Surgeon: Ronny Bacon, MD;  Location: [facility] OR[facility];  Service: General;  Laterality: N/A;   WISDOM TOOTH EXTRACTION      Family History  Problem Relation Age of Onset   Thyroid disease Mother    Hypertension Mother    Cancer Father  leukemia   Stroke Father    Cancer Maternal Aunt        Breast Cancer   Breast cancer Maternal Aunt    Asthma Sister    Miscarriages / Stillbirths Sister     No Known Allergies  Current Outpatient Medications on File Prior to Visit  Medication Sig Dispense Refill   EPINEPHrine 0.3 mg/0.3 mL IJ SOAJ injection Inject 0.3 mg into the muscle as needed for anaphylaxis. 1 each 0   ferrous gluconate (FERGON) 324 MG tablet Take 1 tablet (324 mg total) by mouth daily with breakfast. 30 tablet 3   fluticasone (FLONASE) 50 MCG/ACT nasal spray PLACE 1 SPRAY INTO BOTH NOSTRILS 2 (TWO) TIMES DAILY 16 mL 3   naproxen (NAPROSYN) 500 MG tablet Take 1 tablet (500 mg total) by mouth 2 (two) times daily with a meal. As needed for pain and during your periods 30 tablet 2    tranexamic acid (LYSTEDA) 650 MG TABS tablet TAKE 2 TABLETS BY MOUTH 3 TIMES A DAY TAKE DURING MENSES FOR A MAXIMUM OF 5 DAYS 30 tablet 2   No current facility-administered medications on file prior to visit.    BP (!) 148/76   Pulse (!) 110   Temp (!) 96.8 F (36 C) (Temporal)   Ht '5\' 1"'$  (1.549 m)   Wt 153 lb (69.4 kg)   LMP 06/29/2022 (Exact Date)   SpO2 98%   BMI 28.91 kg/m  Objective:   Physical Exam Exam conducted with a chaperone present.  Constitutional:      General: She is not in acute distress. Pulmonary:     Effort: Pulmonary effort is normal.  Genitourinary:    Labia:        Right: No lesion.        Left: No lesion.      Vagina: Vaginal discharge present. No erythema.     Cervix: Discharge present. No cervical motion tenderness, erythema or cervical bleeding.     Comments: Scant amount of whitish vaginal discharge. No foul smell. Skin:    General: Skin is warm and dry.           Assessment & Plan:   Problem List Items Addressed This Visit       Other   Urinary frequency - Primary    UA today with 2+ blood, otherwise negative. Culture ordered and pending.  Await results.       Relevant Orders   POCT Urinalysis Dipstick (Automated) (Completed)   Urine Culture   Vaginal discharge    Checking labs today including Wet Prep, gonorrhea, chlamydia. Await results.       Relevant Orders   C. trachomatis/N. gonorrhoeae RNA   WET PREP BY MOLECULAR PROBE       Pleas Koch, NP

## 2022-07-11 NOTE — Patient Instructions (Signed)
We will be in touch once we receive your vaginal swab and urine test results.   It was a pleasure to see you today!

## 2022-07-14 ENCOUNTER — Other Ambulatory Visit: Payer: Self-pay | Admitting: Primary Care

## 2022-07-14 DIAGNOSIS — N760 Acute vaginitis: Secondary | ICD-10-CM

## 2022-07-14 LAB — WET PREP BY MOLECULAR PROBE
Candida species: NOT DETECTED
MICRO NUMBER:: 14111126
SPECIMEN QUALITY:: ADEQUATE
Trichomonas vaginosis: NOT DETECTED

## 2022-07-14 LAB — URINE CULTURE
MICRO NUMBER:: 14111127
SPECIMEN QUALITY:: ADEQUATE

## 2022-07-14 LAB — C. TRACHOMATIS/N. GONORRHOEAE RNA
C. trachomatis RNA, TMA: NOT DETECTED
N. gonorrhoeae RNA, TMA: NOT DETECTED

## 2022-07-14 MED ORDER — METRONIDAZOLE 500 MG PO TABS: 500.0000 mg | ORAL_TABLET | Freq: Two times a day (BID) | ORAL | 0 refills | Status: AC

## 2022-07-23 ENCOUNTER — Other Ambulatory Visit: Payer: Self-pay | Admitting: Obstetrics & Gynecology

## 2022-07-23 DIAGNOSIS — N920 Excessive and frequent menstruation with regular cycle: Secondary | ICD-10-CM

## 2022-09-17 DIAGNOSIS — K219 Gastro-esophageal reflux disease without esophagitis: Secondary | ICD-10-CM | POA: Diagnosis not present

## 2022-09-17 DIAGNOSIS — J0100 Acute maxillary sinusitis, unspecified: Secondary | ICD-10-CM | POA: Diagnosis not present

## 2022-09-18 ENCOUNTER — Ambulatory Visit: Payer: BC Managed Care – PPO | Admitting: Nurse Practitioner

## 2022-11-10 ENCOUNTER — Other Ambulatory Visit: Payer: Self-pay | Admitting: Primary Care

## 2022-11-10 DIAGNOSIS — Z1231 Encounter for screening mammogram for malignant neoplasm of breast: Secondary | ICD-10-CM

## 2023-01-07 ENCOUNTER — Encounter: Payer: BC Managed Care – PPO | Admitting: Primary Care

## 2023-01-15 ENCOUNTER — Ambulatory Visit (INDEPENDENT_AMBULATORY_CARE_PROVIDER_SITE_OTHER): Payer: BC Managed Care – PPO | Admitting: Primary Care

## 2023-01-15 ENCOUNTER — Encounter: Payer: Self-pay | Admitting: Primary Care

## 2023-01-15 VITALS — BP 134/78 | HR 78 | Temp 97.6°F | Ht 62.0 in | Wt 153.0 lb

## 2023-01-15 DIAGNOSIS — J3089 Other allergic rhinitis: Secondary | ICD-10-CM

## 2023-01-15 DIAGNOSIS — N920 Excessive and frequent menstruation with regular cycle: Secondary | ICD-10-CM | POA: Insufficient documentation

## 2023-01-15 DIAGNOSIS — D219 Benign neoplasm of connective and other soft tissue, unspecified: Secondary | ICD-10-CM | POA: Diagnosis not present

## 2023-01-15 DIAGNOSIS — D509 Iron deficiency anemia, unspecified: Secondary | ICD-10-CM

## 2023-01-15 DIAGNOSIS — Z0000 Encounter for general adult medical examination without abnormal findings: Secondary | ICD-10-CM | POA: Diagnosis not present

## 2023-01-15 DIAGNOSIS — K219 Gastro-esophageal reflux disease without esophagitis: Secondary | ICD-10-CM

## 2023-01-15 LAB — IBC + FERRITIN
Ferritin: 6.1 ng/mL — ABNORMAL LOW (ref 10.0–291.0)
Iron: 15 ug/dL — ABNORMAL LOW (ref 42–145)
Saturation Ratios: 4.1 % — ABNORMAL LOW (ref 20.0–50.0)
TIBC: 365.4 ug/dL (ref 250.0–450.0)
Transferrin: 261 mg/dL (ref 212.0–360.0)

## 2023-01-15 LAB — COMPREHENSIVE METABOLIC PANEL
ALT: 8 U/L (ref 0–35)
AST: 14 U/L (ref 0–37)
Albumin: 3.3 g/dL — ABNORMAL LOW (ref 3.5–5.2)
Alkaline Phosphatase: 50 U/L (ref 39–117)
BUN: 7 mg/dL (ref 6–23)
CO2: 23 mEq/L (ref 19–32)
Calcium: 8.3 mg/dL — ABNORMAL LOW (ref 8.4–10.5)
Chloride: 107 mEq/L (ref 96–112)
Creatinine, Ser: 0.6 mg/dL (ref 0.40–1.20)
GFR: 109.69 mL/min (ref >60.00)
Glucose, Bld: 81 mg/dL (ref 70–99)
Potassium: 3.8 mEq/L (ref 3.5–5.1)
Sodium: 138 mEq/L (ref 135–145)
Total Bilirubin: 0.2 mg/dL (ref 0.2–1.2)
Total Protein: 6.9 g/dL (ref 6.0–8.3)

## 2023-01-15 LAB — LIPID PANEL
Cholesterol: 105 mg/dL (ref 0–200)
HDL: 53.3 mg/dL (ref >39.00)
LDL Cholesterol: 38 mg/dL (ref 0–99)
NonHDL: 52.1
Total CHOL/HDL Ratio: 2
Triglycerides: 71 mg/dL (ref 0.0–149.0)
VLDL: 14.2 mg/dL (ref 0.0–40.0)

## 2023-01-15 LAB — CBC
HCT: 32 % — ABNORMAL LOW (ref 36.0–46.0)
Hemoglobin: 10 g/dL — ABNORMAL LOW (ref 12.0–15.0)
MCHC: 31.3 g/dL (ref 30.0–36.0)
MCV: 72.1 fl — ABNORMAL LOW (ref 78.0–100.0)
Platelets: 377 10*3/uL (ref 150.0–400.0)
RBC: 4.44 Mil/uL (ref 3.87–5.11)
RDW: 18.1 % — ABNORMAL HIGH (ref 11.5–15.5)
WBC: 5.8 10*3/uL (ref 4.0–10.5)

## 2023-01-15 MED ORDER — NAPROXEN 500 MG PO TABS: 500.0000 mg | ORAL_TABLET | Freq: Two times a day (BID) | ORAL | 1 refills | Status: DC

## 2023-01-15 MED ORDER — FAMOTIDINE 20 MG PO TABS: 20.0000 mg | ORAL_TABLET | Freq: Every day | ORAL | 0 refills | Status: DC

## 2023-01-15 MED ORDER — FLUTICASONE PROPIONATE 50 MCG/ACT NA SUSP: 1.0000 | Freq: Two times a day (BID) | NASAL | 0 refills | Status: DC | PRN

## 2023-01-15 NOTE — Assessment & Plan Note (Signed)
Following with GYN. Reviewed office notes from July 2023.  Continue ferrous sulfate 325 mg daily.

## 2023-01-15 NOTE — Assessment & Plan Note (Signed)
Following with GYN, office notes reviewed from July 2023. Repeat iron studies pending.  Refills provided for naproxen 500 mg BID PRN.

## 2023-01-15 NOTE — Assessment & Plan Note (Signed)
Deteriorated.  Start famotidine 20 mg daily x 3 months. She will update.

## 2023-01-15 NOTE — Assessment & Plan Note (Signed)
Deteriorated.   Recommended she use caution with Zyrtec D, discussed rebound symptoms.  Switch to Zyrtec 10 mg.  Continue Flonase.

## 2023-01-15 NOTE — Patient Instructions (Signed)
Stop by the lab prior to leaving today. I will notify you of your results once received.   It was a pleasure to see you today!  

## 2023-01-15 NOTE — Assessment & Plan Note (Signed)
Repeat iron studies pending today. Continue ferrous sulfate 325 mg daily.  Discussed to follow up with GYN for ongoing menorrhagia.

## 2023-01-15 NOTE — Progress Notes (Signed)
Subjective:    Patient ID: Virginia Bird, female    DOB: [birth year], 42 y.o.   MRN: [id number]  HPI  Virginia Bird is a very pleasant 42 y.o. female who presents today for complete physical and follow up of chronic conditions.  Immunizations: -Tetanus: Completed > 10 years ago, declines   Diet: Fair diet.  Exercise: No regular exercise.  Eye exam: Completes annually  Dental exam: Completes semi-annually    Pap Smear: 2023 per GYN Mammogram: Scheduled for May 2024      Review of Systems  Constitutional:  Negative for unexpected weight change.  HENT:  Negative for rhinorrhea.   Respiratory:  Negative for cough and shortness of breath.   Cardiovascular:  Negative for chest pain.  Gastrointestinal:  Negative for constipation and diarrhea.  Genitourinary:  Positive for menstrual problem. Negative for difficulty urinating.  Musculoskeletal:  Negative for arthralgias and myalgias.  Skin:  Negative for rash.  Allergic/Immunologic: Positive for environmental allergies.  Neurological:  Negative for dizziness and headaches.  Psychiatric/Behavioral:  The patient is not nervous/anxious.          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/21/2021   UTI (urinary tract infection)     Social History   Socioeconomic History   Marital status: Single    Spouse name: Not on file   Number of children: 1   Years of education: Not on file   Highest education level: Not on file  Occupational History   Not on file  Tobacco Use   Smoking status: Some Days    Packs/day: 0.25    Years: 10.00    Additional pack years: 0.00    Total pack years: 2.50    Types: Cigarettes   Smokeless tobacco: Never   Tobacco comments:    Has not tried any quit aids/tn  Vaping Use   Vaping Use: Never used  Substance and Sexual Activity   Alcohol use: Yes     Alcohol/week: 0.0 standard drinks of alcohol    Comment: weekends   Drug use: No   Sexual activity: Yes    Partners: Male  Other Topics Concern   Not on file  Social History Narrative   Single.    1 daughter.    Works at [employer].   Enjoys going to [employer].    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   HEMORRHOID SURGERY N/A 11/08/2021   Procedure: HEMORRHOIDECTOMY;  Surgeon: Campbell Lerner, MD;  Location: [facility] OR[facility];  Service: General;  Laterality: N/A;   RECTAL EXAM UNDER ANESTHESIA N/A 11/08/2021   Procedure: RECTAL EXAM UNDER ANESTHESIA;  Surgeon: Campbell Lerner, MD;  Location: [facility] OR[facility];  Service: General;  Laterality: N/A;   WISDOM TOOTH EXTRACTION      Family History  Problem Relation Age of Onset   Thyroid disease Mother    Hypertension Mother    Cancer Father  leukemia   Stroke Father    Cancer Maternal Aunt        Breast Cancer   Breast cancer Maternal Aunt    Asthma Sister    Miscarriages / Stillbirths Sister     No Known Allergies  Current Outpatient Medications on File Prior to Visit  Medication Sig Dispense Refill   EPINEPHrine 0.3 mg/0.3 mL IJ SOAJ injection Inject 0.3 mg into the muscle as needed for anaphylaxis. 1 each 0   ferrous gluconate (FERGON) 324 MG tablet Take 1 tablet (324 mg total) by mouth daily with breakfast. 30 tablet 3   No current facility-administered medications on file prior to visit.    BP 134/78   Pulse 78   Temp 97.6 F (36.4 C) (Temporal)   Ht 5\' 2"  (1.575 m)   Wt 153 lb (69.4 kg)   LMP 01/14/2023   SpO2 95%   BMI 27.98 kg/m   Objective:   Physical Exam HENT:     Right Ear: Tympanic membrane and ear canal normal.     Left Ear: Tympanic membrane and ear canal normal.     Nose: Nose normal.  Eyes:     Conjunctiva/sclera: Conjunctivae normal.     Pupils: Pupils are equal, round, and reactive to light.  Neck:     Thyroid: No thyromegaly.  Cardiovascular:     Rate and Rhythm: Normal rate and regular rhythm.     Heart sounds: No murmur heard. Pulmonary:     Effort: Pulmonary effort is normal.     Breath sounds: Normal breath sounds. No rales.  Abdominal:     General: Bowel sounds are normal.     Palpations: Abdomen is soft.     Tenderness: There is no abdominal tenderness.  Musculoskeletal:        General: Normal range of motion.     Cervical back: Neck supple.  Lymphadenopathy:     Cervical: No cervical adenopathy.  Skin:    General: Skin is warm and dry.     Findings: No rash.  Neurological:     Mental Status: She is alert and oriented to person, place, and time.     Cranial Nerves: No cranial nerve deficit.     Deep Tendon Reflexes: Reflexes are normal and symmetric.  Psychiatric:        Mood and Affect: Mood normal.           Assessment & Plan:  Preventative health care Assessment & Plan: Tetanus due, she declines.  Pap smear UTD. Mammogram scheduled.  Discussed the importance of a healthy diet and regular exercise in order for weight loss, and to reduce the risk of further co-morbidity.  Exam stable. Labs pending.  Follow up in 1 year for repeat physical.   Orders: -     Lipid panel  Fibroids Assessment & Plan: Following with GYN. Reviewed office notes from July 2023.  Continue ferrous sulfate 325 mg daily.    Environmental and seasonal allergies Assessment & Plan: Deteriorated.   Recommended she use caution with Zyrtec D, discussed rebound symptoms.  Switch to Zyrtec 10 mg.  Continue Flonase.   Orders: -     Fluticasone Propionate; Place 1 spray into both  nostrils 2 (two) times daily as needed for allergies or rhinitis.  Dispense: 48 mL; Refill: 0  Iron deficiency anemia, unspecified iron deficiency anemia type Assessment & Plan: Repeat iron studies pending today. Continue ferrous sulfate 325 mg daily.  Discussed to follow up with GYN for  ongoing menorrhagia.   Orders: -     CBC -     Comprehensive metabolic panel -     IBC + Ferritin  Gastroesophageal reflux disease, unspecified whether esophagitis present Assessment & Plan: Deteriorated.  Start famotidine 20 mg daily x 3 months. She will update.   Orders: -     Famotidine; Take 1 tablet (20 mg total) by mouth daily. for heartburn.  Dispense: 90 tablet; Refill: 0  Menorrhagia with regular cycle Assessment & Plan: Following with GYN, office notes reviewed from July 2023. Repeat iron studies pending.  Refills provided for naproxen 500 mg BID PRN.  Orders: -     Naproxen; Take 1 tablet (500 mg total) by mouth 2 (two) times daily with a meal. As needed for cramping  Dispense: 60 tablet; Refill: 1        Doreene Nest, NP

## 2023-01-15 NOTE — Assessment & Plan Note (Signed)
Tetanus due, she declines.  Pap smear UTD. Mammogram scheduled.  Discussed the importance of a healthy diet and regular exercise in order for weight loss, and to reduce the risk of further co-morbidity.  Exam stable. Labs pending.  Follow up in 1 year for repeat physical.

## 2023-01-16 ENCOUNTER — Telehealth: Payer: Self-pay | Admitting: Primary Care

## 2023-01-16 DIAGNOSIS — D509 Iron deficiency anemia, unspecified: Secondary | ICD-10-CM

## 2023-01-16 NOTE — Telephone Encounter (Signed)
Called patient reviewed all information and repeated back to me. Will call if any questions.  ? ?

## 2023-01-16 NOTE — Telephone Encounter (Signed)
Pt called in to make PCP aware that she would like to move forward with infusion that was discuss at office visit would like a call back #[phone]

## 2023-01-16 NOTE — Telephone Encounter (Signed)
Noted.   Please notify her:   You will either be contacted via phone regarding your referral to hematology, or you may receive a letter on your MyChart portal from our referral team with instructions for scheduling an appointment. Please let us know if you have not been contacted by anyone within two weeks.

## 2023-01-20 ENCOUNTER — Ambulatory Visit
Admission: RE | Admit: 2023-01-20 | Discharge: 2023-01-20 | Disposition: A | Discharge status: Home / Self Care | Payer: BC Managed Care – PPO | Source: Ambulatory Visit | Attending: Primary Care | Admitting: Primary Care

## 2023-01-20 DIAGNOSIS — Z1231 Encounter for screening mammogram for malignant neoplasm of breast: Secondary | ICD-10-CM | POA: Insufficient documentation

## 2023-01-22 ENCOUNTER — Inpatient Hospital Stay: Payer: BC Managed Care – PPO | Attending: Oncology | Admitting: Oncology

## 2023-01-22 ENCOUNTER — Inpatient Hospital Stay: Payer: BC Managed Care – PPO

## 2023-01-22 ENCOUNTER — Encounter: Payer: Self-pay | Admitting: Oncology

## 2023-01-22 VITALS — BP 111/73 | HR 97 | Temp 97.6°F | Resp 16 | Ht 62.0 in | Wt 151.7 lb

## 2023-01-22 DIAGNOSIS — D509 Iron deficiency anemia, unspecified: Secondary | ICD-10-CM | POA: Diagnosis not present

## 2023-01-22 DIAGNOSIS — N920 Excessive and frequent menstruation with regular cycle: Secondary | ICD-10-CM | POA: Insufficient documentation

## 2023-01-22 NOTE — Progress Notes (Signed)
[facility]  [phone] [phone]  ID: Virginia Bird OB: [birth year]  MR#: [id number]  NGE#:[id number]  Patient Care Team: Doreene Nest, NP as PCP - General (Internal Medicine) Tereso Newcomer, MD as Attending Physician (Obstetrics and Gynecology)  CHIEF COMPLAINT: Iron deficiency anemia.  INTERVAL HISTORY: Patient was last seen in clinic approximately 2 years ago and was referred back for declining hemoglobin and iron stores.  Patient has chronic weakness and fatigue, but otherwise feels well.  She continues to have heavy menses.  She has no neurologic complaints.  She denies any recent fevers or illnesses.  She has a good appetite and denies weight loss.  She has no chest pain, shortness of breath, cough, or hemoptysis.  She denies any nausea, vomiting, constipation, or diarrhea.  She has no melena or hematochezia.  She has no urinary complaints.  Patient offers no further specific complaints today.  REVIEW OF SYSTEMS:   Review of Systems  Constitutional:  Positive for malaise/fatigue. Negative for fever and weight loss.  Respiratory: Negative.  Negative for cough and shortness of breath.   Cardiovascular: Negative.  Negative for chest pain and leg swelling.  Gastrointestinal: Negative.  Negative for abdominal pain, blood in stool and melena.  Genitourinary: Negative.  Negative for hematuria.  Musculoskeletal: Negative.  Negative for back pain.  Skin: Negative.  Negative for rash.  Neurological:  Positive for weakness. Negative for dizziness, focal weakness and headaches.  Psychiatric/Behavioral: Negative.  The patient is not nervous/anxious.     As per HPI. Otherwise, a complete review of systems is negative.  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   HEMORRHOID SURGERY N/A 11/08/2021   Procedure: HEMORRHOIDECTOMY;  Surgeon: Campbell Lerner, MD;  Location: [facility] ORS;  Service: General;  Laterality: N/A;   RECTAL EXAM UNDER ANESTHESIA N/A 11/08/2021   Procedure: RECTAL EXAM UNDER ANESTHESIA;  Surgeon: Campbell Lerner, MD;  Location: [facility] ORS;  Service: General;  Laterality: N/A;   WISDOM TOOTH EXTRACTION      FAMILY HISTORY: Family History  Problem Relation Age of Onset   Thyroid disease Mother    Hypertension Mother    Cancer Father        leukemia   Stroke Father    Cancer Maternal Aunt        Breast Cancer   Breast cancer Maternal Aunt    Asthma Sister    Miscarriages / Stillbirths Sister     ADVANCED DIRECTIVES (Y/N):  N  HEALTH MAINTENANCE: Social History   Tobacco Use   Smoking status: Some Days    Packs/day: 0.25    Years: 10.00    Additional pack years: 0.00    Total pack years: 2.50    Types: Cigarettes   Smokeless tobacco: Never   Tobacco comments:    Has not tried any quit aids/tn  Vaping Use   Vaping Use: Never used  Substance Use Topics   Alcohol use: Yes    Alcohol/week: 0.0 standard drinks of alcohol    Comment: weekends   Drug use: No  Colonoscopy:  PAP:  Bone density:  Lipid panel:  No Known Allergies  Current Outpatient Medications  Medication Sig Dispense Refill   EPINEPHrine 0.3 mg/0.3 mL IJ SOAJ injection Inject 0.3 mg into the muscle as needed for anaphylaxis. 1 each 0   famotidine (PEPCID) 20 MG tablet Take 1 tablet (20 mg total) by mouth daily. for heartburn. 90 tablet 0   ferrous gluconate (FERGON) 324 MG tablet Take 1 tablet (324 mg total) by mouth daily with breakfast. 30 tablet 3   fluticasone (FLONASE) 50 MCG/ACT nasal spray Place 1 spray into both nostrils 2 (two) times daily  as needed for allergies or rhinitis. 48 mL 0   naproxen (NAPROSYN) 500 MG tablet Take 1 tablet (500 mg total) by mouth 2 (two) times daily with a meal. As needed for cramping 60 tablet 1   No current facility-administered medications for this visit.    OBJECTIVE: Vitals:   01/22/23 1036  BP: 111/73  Pulse: 97  Resp: 16  Temp: 97.6 F (36.4 C)  SpO2: 100%     Body mass index is 27.75 kg/m.    ECOG FS:0 - Asymptomatic  General: Well-developed, well-nourished, no acute distress. Eyes: Pink conjunctiva, anicteric sclera. HEENT: Normocephalic, moist mucous membranes. Lungs: No audible wheezing or coughing. Heart: Regular rate and rhythm. Abdomen: Soft, nontender, no obvious distention. Musculoskeletal: No edema, cyanosis, or clubbing. Neuro: Alert, answering all questions appropriately. Cranial nerves grossly intact. Skin: No rashes or petechiae noted. Psych: Normal affect.  LAB RESULTS:  Lab Results  Component Value Date   NA 138 01/15/2023   K 3.8 01/15/2023   CL 107 01/15/2023   CO2 23 01/15/2023   GLUCOSE 81 01/15/2023   BUN 7 01/15/2023   CREATININE 0.60 01/15/2023   CALCIUM 8.3 (L) 01/15/2023   PROT 6.9 01/15/2023   ALBUMIN 3.3 (L) 01/15/2023   AST 14 01/15/2023   ALT 8 01/15/2023   ALKPHOS 50 01/15/2023   BILITOT 0.2 01/15/2023   GFRNONAA >60 04/11/2009   GFRAA  04/11/2009    >60        The eGFR has been calculated using the MDRD equation. This calculation has not been validated in all clinical situations. eGFR's persistently <60 mL/min signify possible Chronic Kidney Disease.    Lab Results  Component Value Date   WBC 5.8 01/15/2023   NEUTROABS 5.6 08/23/2021   HGB 10.0 (L) 01/15/2023   HCT 32.0 (L) 01/15/2023   MCV 72.1 (L) 01/15/2023   PLT 377.0 01/15/2023   Lab Results  Component Value Date   IRON 15 (L) 01/15/2023   TIBC 365.4 01/15/2023   IRONPCTSAT 4.1 (L) 01/15/2023   Lab Results  Component Value Date   FERRITIN 6.1 (L) 01/15/2023      STUDIES: MM 3D SCREEN BREAST BILATERAL  Result Date: 01/21/2023 CLINICAL DATA:  Screening. EXAM: DIGITAL SCREENING BILATERAL MAMMOGRAM WITH TOMOSYNTHESIS AND CAD TECHNIQUE: Bilateral screening digital craniocaudal and mediolateral oblique mammograms were obtained. Bilateral screening digital breast tomosynthesis was performed. The images were evaluated with computer-aided detection. COMPARISON:  Previous exam(s). ACR Breast Density Category c: The breasts are heterogeneously dense, which may obscure small masses. FINDINGS: There are no findings suspicious for malignancy. IMPRESSION: No mammographic evidence of malignancy. A result letter of this screening mammogram will be mailed directly to the patient. RECOMMENDATION: Screening mammogram in one year. (Code:SM-B-01Y) BI-RADS CATEGORY  1: Negative. Electronically Signed   By: Ted Mcalpine M.D.   On: 01/21/2023 12:17    ASSESSMENT: Iron deficiency  anemia.  PLAN:    Iron deficiency anemia: Likely secondary to fibroids and heavy menstrual bleeding.  Patient had successful hemorrhoid surgery earlier this year.  Her hemoglobin and iron stores have trended down and she is symptomatic.  She last received IV Venofer on January 03, 2021.  Proceed with 5 infusions over the next 2 to 3 weeks.  Patient will then return to clinic in 4 months with repeat laboratory work, further evaluation, and continuation of treatment if needed. Heavy menses: By report patient has fibroids. Continue follow-up with OB/GYN as scheduled.  I spent a total of 30 minutes reviewing chart data, face-to-face evaluation with the patient, counseling and coordination of care as detailed above.   Patient expressed understanding and was in agreement with this plan. She also understands that She can call clinic at any time with any questions, concerns, or complaints.    Jeralyn Ruths, MD   01/22/2023 12:47 PM

## 2023-01-26 ENCOUNTER — Ambulatory Visit: Payer: BC Managed Care – PPO

## 2023-01-28 ENCOUNTER — Inpatient Hospital Stay: Payer: BC Managed Care – PPO

## 2023-01-28 ENCOUNTER — Ambulatory Visit: Payer: BC Managed Care – PPO

## 2023-01-28 VITALS — BP 129/78 | HR 76 | Temp 97.0°F | Resp 18

## 2023-01-28 DIAGNOSIS — D509 Iron deficiency anemia, unspecified: Secondary | ICD-10-CM | POA: Diagnosis not present

## 2023-01-28 MED ORDER — SODIUM CHLORIDE 0.9 % IV SOLN
200.0000 mg | Freq: Once | INTRAVENOUS | Status: AC
  Administered 2023-01-28: 200 mg via INTRAVENOUS
  Filled 2023-01-28: qty 200

## 2023-01-28 MED ORDER — SODIUM CHLORIDE 0.9 % IV SOLN
Freq: Once | INTRAVENOUS | Status: AC
  Filled 2023-01-28: qty 250

## 2023-01-30 ENCOUNTER — Inpatient Hospital Stay: Payer: BC Managed Care – PPO

## 2023-01-30 VITALS — BP 125/72 | HR 76 | Temp 98.7°F | Resp 16

## 2023-01-30 DIAGNOSIS — D509 Iron deficiency anemia, unspecified: Secondary | ICD-10-CM

## 2023-01-30 MED ORDER — SODIUM CHLORIDE 0.9 % IV SOLN
200.0000 mg | Freq: Once | INTRAVENOUS | Status: AC
  Administered 2023-01-30: 200 mg via INTRAVENOUS
  Filled 2023-01-30: qty 200

## 2023-01-30 MED ORDER — SODIUM CHLORIDE 0.9 % IV SOLN
Freq: Once | INTRAVENOUS | Status: AC
  Filled 2023-01-30: qty 250

## 2023-01-30 NOTE — Patient Instructions (Signed)

## 2023-01-30 NOTE — Progress Notes (Signed)
Declined 30 minute post-observation. Vitals stable at discharge.  

## 2023-02-02 ENCOUNTER — Ambulatory Visit: Payer: BC Managed Care – PPO

## 2023-02-04 ENCOUNTER — Inpatient Hospital Stay: Payer: BC Managed Care – PPO

## 2023-02-04 ENCOUNTER — Ambulatory Visit: Payer: BC Managed Care – PPO

## 2023-02-04 VITALS — BP 103/76 | HR 89 | Temp 97.8°F | Resp 18

## 2023-02-04 DIAGNOSIS — D509 Iron deficiency anemia, unspecified: Secondary | ICD-10-CM

## 2023-02-04 MED ORDER — SODIUM CHLORIDE 0.9 % IV SOLN
Freq: Once | INTRAVENOUS | Status: AC
  Filled 2023-02-04: qty 250

## 2023-02-04 MED ORDER — SODIUM CHLORIDE 0.9 % IV SOLN
200.0000 mg | Freq: Once | INTRAVENOUS | Status: AC
  Administered 2023-02-04: 200 mg via INTRAVENOUS
  Filled 2023-02-04: qty 200

## 2023-02-06 ENCOUNTER — Inpatient Hospital Stay: Payer: BC Managed Care – PPO

## 2023-02-10 ENCOUNTER — Ambulatory Visit: Payer: BC Managed Care – PPO

## 2023-02-11 ENCOUNTER — Inpatient Hospital Stay: Payer: BC Managed Care – PPO

## 2023-02-11 VITALS — BP 106/73 | HR 98 | Temp 97.0°F | Resp 18

## 2023-02-11 DIAGNOSIS — D509 Iron deficiency anemia, unspecified: Secondary | ICD-10-CM | POA: Diagnosis not present

## 2023-02-11 MED ORDER — SODIUM CHLORIDE 0.9 % IV SOLN
200.0000 mg | Freq: Once | INTRAVENOUS | Status: AC
  Administered 2023-02-11: 200 mg via INTRAVENOUS
  Filled 2023-02-11: qty 200

## 2023-02-11 MED ORDER — SODIUM CHLORIDE 0.9 % IV SOLN
Freq: Once | INTRAVENOUS | Status: AC
  Filled 2023-02-11: qty 250

## 2023-02-11 MED ORDER — HEPARIN SOD (PORK) LOCK FLUSH 100 UNIT/ML IV SOLN
500.0000 [IU] | Freq: Once | INTRAVENOUS | Status: AC | PRN
  Administered 2023-02-11: 500 [IU]
  Filled 2023-02-11: qty 5

## 2023-02-11 NOTE — Patient Instructions (Signed)

## 2023-02-12 MED FILL — Iron Sucrose Inj 20 MG/ML (Fe Equiv): INTRAVENOUS | Qty: 10 | Status: AC

## 2023-02-13 ENCOUNTER — Inpatient Hospital Stay: Payer: BC Managed Care – PPO

## 2023-02-13 VITALS — BP 123/78 | HR 100 | Temp 97.6°F | Resp 17

## 2023-02-13 DIAGNOSIS — D509 Iron deficiency anemia, unspecified: Secondary | ICD-10-CM

## 2023-02-13 MED ORDER — SODIUM CHLORIDE 0.9 % IV SOLN
Freq: Once | INTRAVENOUS | Status: AC
  Filled 2023-02-13: qty 250

## 2023-02-13 MED ORDER — SODIUM CHLORIDE 0.9% FLUSH
10.0000 mL | Freq: Once | INTRAVENOUS | Status: AC | PRN
  Administered 2023-02-13: 10 mL
  Filled 2023-02-13: qty 10

## 2023-02-13 MED ORDER — SODIUM CHLORIDE 0.9 % IV SOLN
200.0000 mg | Freq: Once | INTRAVENOUS | Status: AC
  Administered 2023-02-13: 200 mg via INTRAVENOUS
  Filled 2023-02-13: qty 200

## 2023-02-13 NOTE — Progress Notes (Signed)
Patient tolerated Venofer infusion well. Explained recommendation of 30 min post monitoring. Patient refused to wait post monitoring. Educated on what signs to watch for & to call with any concerns. No questions, discharged. Stable  

## 2023-02-13 NOTE — Patient Instructions (Signed)

## 2023-03-27 ENCOUNTER — Encounter: Payer: Self-pay | Admitting: Oncology

## 2023-04-01 ENCOUNTER — Encounter: Payer: Self-pay | Admitting: Primary Care

## 2023-04-01 ENCOUNTER — Ambulatory Visit (INDEPENDENT_AMBULATORY_CARE_PROVIDER_SITE_OTHER): Payer: PRIVATE HEALTH INSURANCE | Admitting: Primary Care

## 2023-04-01 ENCOUNTER — Encounter: Payer: Self-pay | Admitting: Oncology

## 2023-04-01 ENCOUNTER — Other Ambulatory Visit (HOSPITAL_COMMUNITY)
Admission: RE | Admit: 2023-04-01 | Discharge: 2023-04-01 | Disposition: A | Discharge status: Home / Self Care | Payer: PRIVATE HEALTH INSURANCE | Source: Ambulatory Visit | Attending: Primary Care | Admitting: Primary Care

## 2023-04-01 VITALS — BP 110/64 | HR 103 | Temp 97.6°F | Ht 62.0 in | Wt 153.0 lb

## 2023-04-01 DIAGNOSIS — Z124 Encounter for screening for malignant neoplasm of cervix: Secondary | ICD-10-CM | POA: Diagnosis not present

## 2023-04-01 DIAGNOSIS — Z91018 Allergy to other foods: Secondary | ICD-10-CM | POA: Diagnosis not present

## 2023-04-01 DIAGNOSIS — R87610 Atypical squamous cells of undetermined significance on cytologic smear of cervix (ASC-US): Secondary | ICD-10-CM

## 2023-04-01 DIAGNOSIS — Z113 Encounter for screening for infections with a predominantly sexual mode of transmission: Secondary | ICD-10-CM | POA: Insufficient documentation

## 2023-04-01 MED ORDER — EPINEPHRINE 0.3 MG/0.3ML IJ SOAJ
0.3000 mg | INTRAMUSCULAR | 0 refills | Status: AC | PRN
Start: 2023-04-01 — End: ?

## 2023-04-01 NOTE — Patient Instructions (Signed)
Stop by the lab prior to leaving today. I will notify you of your results once received.   It was a pleasure to see you today!  

## 2023-04-01 NOTE — Assessment & Plan Note (Signed)
Asymptomatic.  Labs collected today including gonorrhea, chlamydia, trichomonas, herpes, HIV, hep C, syphilis.

## 2023-04-01 NOTE — Assessment & Plan Note (Signed)
Repeat pap smear pending today. Await results.

## 2023-04-01 NOTE — Progress Notes (Signed)
Subjective:    Patient ID: Virginia Bird, female    DOB: 06-08-1981, 42 y.o.   MRN: [id number]  HPI  Virginia Bird is a very pleasant 42 y.o. female who presents today for Pap smear only.  She is also needing a refill of her EpiPen.  She completed her physical exam in May 2024.  Her last Pap smear was completed in June 2023 which showed ASCUS with negative high-risk HPV.  She would also like STD testing.   She denies vaginal discharge, vaginal itching, dysuria.  Review of Systems  Gastrointestinal:  Negative for abdominal pain.  Genitourinary:  Negative for dysuria, pelvic pain and vaginal discharge.         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/21/2021   UTI (urinary tract infection)     Social History   Socioeconomic History   Marital status: Single    Spouse name: Not on file   Number of children: 1   Years of education: Not on file   Highest education level: Not on file  Occupational History   Not on file  Tobacco Use   Smoking status: Some Days    Current packs/day: 0.25    Average packs/day: 0.3 packs/day for 10.0 years (2.5 ttl pk-yrs)    Types: Cigarettes   Smokeless tobacco: Never   Tobacco comments:    Has not tried any quit aids/tn  Vaping Use   Vaping status: Never Used  Substance and Sexual Activity   Alcohol use: Yes    Alcohol/week: 0.0 standard drinks of alcohol    Comment: weekends   Drug use: No   Sexual activity: Yes    Partners: Male  Other Topics Concern   Not on file  Social History Narrative   Single.    1 daughter.    Works at [employer].   Enjoys going to [employer].    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   HEMORRHOID SURGERY N/A 11/08/2021   Procedure: HEMORRHOIDECTOMY;  Surgeon: Campbell Lerner, MD;  Location: [facility] ORS;  Service: General;  Laterality: N/A;   RECTAL EXAM UNDER ANESTHESIA N/A 11/08/2021   Procedure: RECTAL EXAM UNDER ANESTHESIA;  Surgeon: Campbell Lerner, MD;  Location: [facility] ORS;  Service: General;  Laterality: N/A;   WISDOM TOOTH EXTRACTION      Family History  Problem Relation Age of Onset   Thyroid disease Mother    Hypertension Mother    Cancer Father        leukemia   Stroke Father    Cancer Maternal Aunt        Breast Cancer   Breast cancer Maternal Aunt    Asthma Sister    Miscarriages / Stillbirths Sister     No Known Allergies  Current Outpatient Medications on File Prior to Visit  Medication Sig Dispense Refill   famotidine (PEPCID) 20 MG tablet Take 1 tablet (20 mg total)  by mouth daily. for heartburn. 90 tablet 0   ferrous gluconate (FERGON) 324 MG tablet Take 1 tablet (324 mg total) by mouth daily with breakfast. 30 tablet 3   fluticasone (FLONASE) 50 MCG/ACT nasal spray Place 1 spray into both nostrils 2 (two) times daily as needed for allergies or rhinitis. 48 mL 0   naproxen (NAPROSYN) 500 MG tablet Take 1 tablet (500 mg total) by mouth 2 (two) times daily with a meal. As needed for cramping 60 tablet 1   No current facility-administered medications on file prior to visit.    BP 110/64   Pulse (!) 103   Temp 97.6 F (36.4 C) (Temporal)   Ht 5\' 2"  (1.575 m)   Wt 153 lb (69.4 kg)   LMP 03/09/2023   SpO2 98%   BMI 27.98 kg/m  Objective:   Physical Exam Exam conducted with a chaperone present.  Cardiovascular:     Rate and Rhythm: Normal rate.  Pulmonary:     Effort: Pulmonary effort is normal.  Genitourinary:    Exam  position: Lithotomy position.     Labia:        Right: No tenderness or lesion.        Left: No tenderness or lesion.      Vagina: Bleeding present. No vaginal discharge.     Cervix: No cervical motion tenderness or discharge.     Uterus: Normal.      Adnexa: Right adnexa normal and left adnexa normal.     Comments: Scant amount of dark blood Neurological:     Mental Status: She is alert.           Assessment & Plan:  ASCUS of cervix with negative high risk HPV Assessment & Plan: Repeat pap smear pending today. Await results.    Screening for cervical cancer -     Cytology - PAP  Multiple food allergies -     EPINEPHrine; Inject 0.3 mg into the muscle as needed for anaphylaxis.  Dispense: 1 each; Refill: 0  Screening for STD (sexually transmitted disease) Assessment & Plan: Asymptomatic.  Labs collected today including gonorrhea, chlamydia, trichomonas, herpes, HIV, hep C, syphilis.  Orders: -     Hepatitis C antibody -     HIV Antibody (routine testing w rflx) -     RPR        Doreene Nest, NP

## 2023-04-02 LAB — HEPATITIS C ANTIBODY: Hepatitis C Ab: NONREACTIVE

## 2023-04-02 LAB — RPR: RPR Ser Ql: NONREACTIVE

## 2023-04-02 LAB — HIV ANTIBODY (ROUTINE TESTING W REFLEX): HIV 1&2 Ab, 4th Generation: NONREACTIVE

## 2023-04-06 LAB — CYTOLOGY - PAP
Adequacy: ABSENT
Chlamydia: NEGATIVE
Comment: NEGATIVE
Comment: NEGATIVE
Comment: NEGATIVE
Comment: NEGATIVE
Comment: NORMAL
Diagnosis: NEGATIVE
HSV1: NEGATIVE
HSV2: NEGATIVE
High risk HPV: NEGATIVE
Neisseria Gonorrhea: NEGATIVE
Trichomonas: NEGATIVE

## 2023-04-08 ENCOUNTER — Other Ambulatory Visit: Payer: Self-pay | Admitting: Primary Care

## 2023-04-08 ENCOUNTER — Encounter: Payer: Self-pay | Admitting: Oncology

## 2023-04-08 DIAGNOSIS — K219 Gastro-esophageal reflux disease without esophagitis: Secondary | ICD-10-CM

## 2023-05-08 ENCOUNTER — Encounter: Payer: Self-pay | Admitting: Family Medicine

## 2023-05-08 ENCOUNTER — Ambulatory Visit: Payer: PRIVATE HEALTH INSURANCE | Admitting: Family Medicine

## 2023-05-08 VITALS — BP 120/78 | HR 84 | Temp 98.0°F | Ht 62.0 in | Wt 161.0 lb

## 2023-05-08 DIAGNOSIS — N938 Other specified abnormal uterine and vaginal bleeding: Secondary | ICD-10-CM

## 2023-05-08 DIAGNOSIS — R350 Frequency of micturition: Secondary | ICD-10-CM | POA: Diagnosis not present

## 2023-05-08 DIAGNOSIS — R140 Abdominal distension (gaseous): Secondary | ICD-10-CM

## 2023-05-08 LAB — POC URINALSYSI DIPSTICK (AUTOMATED)
Bilirubin, UA: NEGATIVE
Glucose, UA: NEGATIVE
Ketones, UA: NEGATIVE
Leukocytes, UA: NEGATIVE
Nitrite, UA: NEGATIVE
Protein, UA: POSITIVE — AB
Spec Grav, UA: 1.025 (ref 1.010–1.025)
Urobilinogen, UA: 0.2 E.U./dL
pH, UA: 6 (ref 5.0–8.0)

## 2023-05-08 NOTE — Patient Instructions (Addendum)
Can start probiotic like align or culturelle .  We will send the urine for culture.  Avoid bladder irritants  like soda , caffeine, citrus, alcohol  and push water.

## 2023-05-08 NOTE — Assessment & Plan Note (Signed)
Acute, she has had intermittent irregular menstrual cycles and feels she may be beginning to go through menopause.  Her mother went through menopause in her 31s as well. We discussed that some of her symptoms may be secondary to hormonal changes and dysfunctional uterine bleeding.  If this symptom persists she can follow-up consider following up with her primary care doctor or back follicles.

## 2023-05-08 NOTE — Assessment & Plan Note (Signed)
Acute, unclear if this is related to bacterial overgrowth versus recent soda intake.  Recommended pushing water trial of probiotic.

## 2023-05-08 NOTE — Progress Notes (Signed)
Patient ID: Virginia Bird, female    DOB: August 18, 1981, 42 y.o.   MRN: [id number]  This visit was conducted in person.  BP 120/78 (BP Location: Left Arm, Patient Position: Sitting, Cuff Size: Normal)   Pulse 84   Temp 98 F (36.7 C) (Temporal)   Ht 5\' 2"  (1.575 m)   Wt 161 lb (73 kg)   LMP 04/28/2023   SpO2 99%   BMI 29.45 kg/m    CC:  Chief Complaint  Patient presents with   Urinary Frequency   Abdominal Cramping   Abnormal Spotting    Subjective:   HPI: Virginia Bird is a 42 y.o. female presenting on 05/08/2023 for Urinary Frequency, Abdominal Cramping, and Abnormal Spotting  Urinary Frequency  This is a new problem. The current episode started in the past 7 days. The problem has been gradually worsening. Quality: none. There has been no fever. She is Sexually active. There is No history of pyelonephritis. Associated symptoms include frequency and hematuria. Pertinent negatives include no chills, discharge or flank pain.  Abdominal Cramping Associated symptoms include frequency and hematuria. Pertinent negatives include no dysuria, fever or headaches.     Went to [place] 8/9.. started with  lower abdominal cramping. Increase in last few days feeling bloated, fullness in abdomina.  Worse after eating soda.  Regular BMs.. no diarrhea, no blood in stool, no constipation.  .   Hematuria vs vaginal spotting in last few days.Marland Kitchen last menses end of July to 8/4... Noted spotting recur 8/14    Has noted more irregularity of menses lately... occ menses 2 times a month.  Relevant past medical, surgical, family and social history reviewed and updated as indicated. Interim medical history since our last visit reviewed. Allergies and medications reviewed and updated. Outpatient Medications Prior to Visit  Medication Sig Dispense Refill   EPINEPHrine 0.3 mg/0.3 mL IJ SOAJ injection Inject 0.3 mg into the muscle as needed for anaphylaxis. 1 each 0   famotidine (PEPCID) 20 MG  tablet TAKE 1 TABLET (20 MG TOTAL) BY MOUTH DAILY. FOR HEARTBURN. 90 tablet 2   ferrous gluconate (FERGON) 324 MG tablet Take 1 tablet (324 mg total) by mouth daily with breakfast. 30 tablet 3   fluticasone (FLONASE) 50 MCG/ACT nasal spray Place 1 spray into both nostrils 2 (two) times daily as needed for allergies or rhinitis. 48 mL 0   naproxen (NAPROSYN) 500 MG tablet Take 1 tablet (500 mg total) by mouth 2 (two) times daily with a meal. As needed for cramping 60 tablet 1   No facility-administered medications prior to visit.     Per HPI unless specifically indicated in ROS section below Review of Systems  Constitutional:  Negative for chills, fatigue and fever.  HENT:  Negative for congestion.   Eyes:  Negative for pain.  Respiratory:  Negative for cough and shortness of breath.   Cardiovascular:  Negative for chest pain, palpitations and leg swelling.  Gastrointestinal:  Negative for abdominal pain.  Genitourinary:  Positive for frequency and hematuria. Negative for dysuria, flank pain and vaginal bleeding.  Musculoskeletal:  Negative for back pain.  Neurological:  Negative for syncope, light-headedness and headaches.  Psychiatric/Behavioral:  Negative for dysphoric mood.    Objective:  BP 120/78 (BP Location: Left Arm, Patient Position: Sitting, Cuff Size: Normal)   Pulse 84   Temp 98 F (36.7 C) (Temporal)   Ht 5\' 2"  (1.575 m)   Wt 161 lb (73 kg)   LMP 04/28/2023  SpO2 99%   BMI 29.45 kg/m   Wt Readings from Last 3 Encounters:  05/08/23 161 lb (73 kg)  04/01/23 153 lb (69.4 kg)  01/22/23 151 lb 11.2 oz (68.8 kg)      Physical Exam Constitutional:      General: She is not in acute distress.    Appearance: Normal appearance. She is well-developed. She is not ill-appearing or toxic-appearing.  HENT:     Head: Normocephalic.     Right Ear: Hearing, tympanic membrane, ear canal and external ear normal. Tympanic membrane is not erythematous, retracted or bulging.      Left Ear: Hearing, tympanic membrane, ear canal and external ear normal. Tympanic membrane is not erythematous, retracted or bulging.     Nose: No mucosal edema or rhinorrhea.     Right Sinus: No maxillary sinus tenderness or frontal sinus tenderness.     Left Sinus: No maxillary sinus tenderness or frontal sinus tenderness.     Mouth/Throat:     Mouth: Oropharynx is clear and moist and mucous membranes are normal.     Pharynx: Uvula midline.  Eyes:     General: Lids are normal. Lids are everted, no foreign bodies appreciated.     Extraocular Movements: EOM normal.     Conjunctiva/sclera: Conjunctivae normal.     Pupils: Pupils are equal, round, and reactive to light.  Neck:     Thyroid: No thyroid mass or thyromegaly.     Vascular: No carotid bruit.     Trachea: Trachea normal.  Cardiovascular:     Rate and Rhythm: Normal rate and regular rhythm.     Pulses: Normal pulses.     Heart sounds: Normal heart sounds, S1 normal and S2 normal. No murmur heard.    No friction rub. No gallop.  Pulmonary:     Effort: Pulmonary effort is normal. No tachypnea or respiratory distress.     Breath sounds: Normal breath sounds. No decreased breath sounds, wheezing, rhonchi or rales.  Abdominal:     General: Bowel sounds are normal.     Palpations: Abdomen is soft.     Tenderness: There is abdominal tenderness in the suprapubic area.  Musculoskeletal:     Cervical back: Normal range of motion and neck supple.  Skin:    General: Skin is warm, dry and intact.     Findings: No rash.  Neurological:     Mental Status: She is alert.  Psychiatric:        Mood and Affect: Mood is not anxious or depressed.        Speech: Speech normal.        Behavior: Behavior normal. Behavior is cooperative.        Thought Content: Thought content normal.        Cognition and Memory: Cognition and memory normal.        Judgment: Judgment normal.       Results for orders placed or performed in visit on 05/08/23   POCT Urinalysis Dipstick (Automated)  Result Value Ref Range   Color, UA Yellow    Clarity, UA Clear    Glucose, UA Negative Negative   Bilirubin, UA Negative    Ketones, UA Negative    Spec Grav, UA 1.025 1.010 - 1.025   Blood, UA Small (1+)    pH, UA 6.0 5.0 - 8.0   Protein, UA Positive (A) Negative   Urobilinogen, UA 0.2 0.2 or 1.0 E.U./dL   Nitrite, UA Negative  Leukocytes, UA Negative Negative    Assessment and Plan  Urinary frequency Assessment & Plan: Acute, urinalysis shows blood but no white blood cells or nitrite.  This along with atypical symptoms suggests against a urinary tract infection.  Will send urine for culture to verify. Patient feels fairly certain that blood is coming from vaginal area as opposed to urinary.  Encouraged her to push water and avoid bladder irritants such as soda, alcohol, spicy foods etc.  Orders: -     POCT Urinalysis Dipstick (Automated) -     Urine Culture  Abdominal bloating Assessment & Plan: Acute, unclear if this is related to bacterial overgrowth versus recent soda intake.  Recommended pushing water trial of probiotic.   Dysfunctional uterine bleeding Assessment & Plan: Acute, she has had intermittent irregular menstrual cycles and feels she may be beginning to go through menopause.  Her mother went through menopause in her 59s as well. We discussed that some of her symptoms may be secondary to hormonal changes and dysfunctional uterine bleeding.  If this symptom persists she can follow-up consider following up with her primary care doctor or back follicles.     No follow-ups on file.   Kerby Nora, MD

## 2023-05-08 NOTE — Assessment & Plan Note (Addendum)
Acute, urinalysis shows blood but no white blood cells or nitrite.  This along with atypical symptoms suggests against a urinary tract infection.  Will send urine for culture to verify. Patient feels fairly certain that blood is coming from vaginal area as opposed to urinary.  Encouraged her to push water and avoid bladder irritants such as soda, alcohol, spicy foods etc.

## 2023-05-10 LAB — URINE CULTURE
MICRO NUMBER:: 15374004
SPECIMEN QUALITY:: ADEQUATE

## 2023-05-11 ENCOUNTER — Telehealth: Payer: Self-pay | Admitting: Primary Care

## 2023-05-11 NOTE — Telephone Encounter (Signed)
Patient called in asking about her result for her urine culture.She said that she received them through [facility] and is wondering if a provider will be able to review them today,in case she needs an antibiotic,it can be sent in for her today.

## 2023-05-12 ENCOUNTER — Other Ambulatory Visit: Payer: Self-pay | Admitting: Family Medicine

## 2023-05-12 MED ORDER — CEPHALEXIN 500 MG PO CAPS: 500.0000 mg | ORAL_CAPSULE | Freq: Three times a day (TID) | ORAL | 0 refills | Status: DC

## 2023-05-12 NOTE — Telephone Encounter (Signed)
Please return patient's call if she does not get the MyChart before end of today. antibiotic sent to her pharmacy

## 2023-05-12 NOTE — Telephone Encounter (Signed)
[name] reviewed her MyChart message:  Seen by patient Virginia Bird on 05/12/2023  2:24 PM

## 2023-05-21 ENCOUNTER — Ambulatory Visit: Payer: PRIVATE HEALTH INSURANCE

## 2023-05-25 ENCOUNTER — Ambulatory Visit: Payer: PRIVATE HEALTH INSURANCE | Admitting: Family Medicine

## 2023-05-27 ENCOUNTER — Encounter: Payer: Self-pay | Admitting: Oncology

## 2023-05-31 ENCOUNTER — Emergency Department (HOSPITAL_COMMUNITY): Payer: PRIVATE HEALTH INSURANCE

## 2023-05-31 ENCOUNTER — Inpatient Hospital Stay (HOSPITAL_COMMUNITY)
Admission: EM | Admit: 2023-05-31 | Discharge: 2023-06-08 | DRG: 871 | Disposition: A | Discharge status: Home / Self Care | Payer: PRIVATE HEALTH INSURANCE | Attending: Student in an Organized Health Care Education/Training Program | Admitting: Student in an Organized Health Care Education/Training Program

## 2023-05-31 ENCOUNTER — Encounter (HOSPITAL_COMMUNITY): Payer: Self-pay | Admitting: Radiology

## 2023-05-31 ENCOUNTER — Telehealth: Payer: PRIVATE HEALTH INSURANCE | Admitting: Nurse Practitioner

## 2023-05-31 ENCOUNTER — Other Ambulatory Visit: Payer: Self-pay

## 2023-05-31 DIAGNOSIS — A419 Sepsis, unspecified organism: Secondary | ICD-10-CM | POA: Diagnosis not present

## 2023-05-31 DIAGNOSIS — J189 Pneumonia, unspecified organism: Secondary | ICD-10-CM

## 2023-05-31 DIAGNOSIS — A09 Infectious gastroenteritis and colitis, unspecified: Secondary | ICD-10-CM | POA: Diagnosis not present

## 2023-05-31 DIAGNOSIS — D500 Iron deficiency anemia secondary to blood loss (chronic): Secondary | ICD-10-CM | POA: Diagnosis present

## 2023-05-31 DIAGNOSIS — K219 Gastro-esophageal reflux disease without esophagitis: Secondary | ICD-10-CM | POA: Diagnosis present

## 2023-05-31 DIAGNOSIS — D259 Leiomyoma of uterus, unspecified: Secondary | ICD-10-CM | POA: Diagnosis present

## 2023-05-31 DIAGNOSIS — F17210 Nicotine dependence, cigarettes, uncomplicated: Secondary | ICD-10-CM | POA: Diagnosis present

## 2023-05-31 DIAGNOSIS — Z87410 Personal history of cervical dysplasia: Secondary | ICD-10-CM

## 2023-05-31 DIAGNOSIS — E872 Acidosis, unspecified: Secondary | ICD-10-CM | POA: Diagnosis present

## 2023-05-31 DIAGNOSIS — Z8616 Personal history of COVID-19: Secondary | ICD-10-CM

## 2023-05-31 DIAGNOSIS — D509 Iron deficiency anemia, unspecified: Secondary | ICD-10-CM | POA: Diagnosis present

## 2023-05-31 DIAGNOSIS — E871 Hypo-osmolality and hyponatremia: Secondary | ICD-10-CM | POA: Diagnosis present

## 2023-05-31 DIAGNOSIS — B9689 Other specified bacterial agents as the cause of diseases classified elsewhere: Secondary | ICD-10-CM

## 2023-05-31 DIAGNOSIS — R6520 Severe sepsis without septic shock: Principal | ICD-10-CM | POA: Insufficient documentation

## 2023-05-31 DIAGNOSIS — R030 Elevated blood-pressure reading, without diagnosis of hypertension: Secondary | ICD-10-CM | POA: Diagnosis not present

## 2023-05-31 DIAGNOSIS — E876 Hypokalemia: Secondary | ICD-10-CM | POA: Insufficient documentation

## 2023-05-31 DIAGNOSIS — J019 Acute sinusitis, unspecified: Secondary | ICD-10-CM | POA: Diagnosis not present

## 2023-05-31 DIAGNOSIS — Z79899 Other long term (current) drug therapy: Secondary | ICD-10-CM

## 2023-05-31 DIAGNOSIS — N938 Other specified abnormal uterine and vaginal bleeding: Secondary | ICD-10-CM | POA: Diagnosis present

## 2023-05-31 DIAGNOSIS — R519 Headache, unspecified: Secondary | ICD-10-CM | POA: Diagnosis not present

## 2023-05-31 DIAGNOSIS — Z1152 Encounter for screening for COVID-19: Secondary | ICD-10-CM

## 2023-05-31 LAB — CBC WITH DIFFERENTIAL/PLATELET
Abs Immature Granulocytes: 0.04 10*3/uL (ref 0.00–0.07)
Basophils Absolute: 0 10*3/uL (ref 0.0–0.1)
Basophils Relative: 0 %
Eosinophils Absolute: 0 10*3/uL (ref 0.0–0.5)
Eosinophils Relative: 0 %
HCT: 31.8 % — ABNORMAL LOW (ref 36.0–46.0)
Hemoglobin: 9.8 g/dL — ABNORMAL LOW (ref 12.0–15.0)
Immature Granulocytes: 0 %
Lymphocytes Relative: 3 %
Lymphs Abs: 0.3 10*3/uL — ABNORMAL LOW (ref 0.7–4.0)
MCH: 26 pg (ref 26.0–34.0)
MCHC: 30.8 g/dL (ref 30.0–36.0)
MCV: 84.4 fL (ref 80.0–100.0)
Monocytes Absolute: 0 10*3/uL — ABNORMAL LOW (ref 0.1–1.0)
Monocytes Relative: 0 %
Neutro Abs: 9.6 10*3/uL — ABNORMAL HIGH (ref 1.7–7.7)
Neutrophils Relative %: 97 %
Platelets: 344 10*3/uL (ref 150–400)
RBC: 3.77 MIL/uL — ABNORMAL LOW (ref 3.87–5.11)
RDW: 13.6 % (ref 11.5–15.5)
WBC: 9.9 10*3/uL (ref 4.0–10.5)
nRBC: 0 % (ref 0.0–0.2)

## 2023-05-31 LAB — APTT: aPTT: 28 s (ref 24–36)

## 2023-05-31 LAB — PROTIME-INR
INR: 1.2 (ref 0.8–1.2)
Prothrombin Time: 15.2 s (ref 11.4–15.2)

## 2023-05-31 MED ORDER — AMOXICILLIN-POT CLAVULANATE 875-125 MG PO TABS
1.0000 | ORAL_TABLET | Freq: Two times a day (BID) | ORAL | 0 refills | Status: DC
Start: 2023-05-31 — End: 2023-06-08

## 2023-05-31 MED ORDER — SODIUM CHLORIDE 0.9 % IV SOLN
500.0000 mg | INTRAVENOUS | Status: DC
  Administered 2023-06-01: 500 mg via INTRAVENOUS
  Filled 2023-05-31: qty 5

## 2023-05-31 MED ORDER — LACTATED RINGERS IV SOLN: INTRAVENOUS | Status: AC

## 2023-05-31 MED ORDER — SODIUM CHLORIDE 0.9 % IV SOLN
2.0000 g | INTRAVENOUS | Status: AC
  Administered 2023-05-31 – 2023-06-04 (×5): 2 g via INTRAVENOUS
  Filled 2023-05-31 (×5): qty 20

## 2023-05-31 MED ORDER — LACTATED RINGERS IV BOLUS (SEPSIS)
1000.0000 mL | Freq: Once | INTRAVENOUS | Status: AC
  Administered 2023-05-31: 1000 mL via INTRAVENOUS

## 2023-05-31 NOTE — ED Triage Notes (Signed)
Pt arrived from home via [facility] with a sinus infection x1 week. Pt has been self medicating. Had an Evisit today,Amoxicillin started today but developed a fever 102.  Initial vital signs with EMS 178 HR 140/80 28 CBG 148  Pt took tylenol 20ml , 650mg  and naproxen.  After fluid HR 148 136/82

## 2023-05-31 NOTE — Progress Notes (Signed)

## 2023-05-31 NOTE — Progress Notes (Signed)
I have spent 5 minutes in review of e-visit questionnaire, review and updating patient chart, medical decision making and response to patient.  ° °Jerrell Mangel W Secilia Apps, NP ° °  °

## 2023-05-31 NOTE — ED Provider Notes (Incomplete)
[name] EMERGENCY DEPARTMENT AT [facility] Provider Note   CSN: [id number] Arrival date & time: 05/31/23  2311     History {Add pertinent medical, surgical, social history, OB history to HPI:1} Chief Complaint  Patient presents with   Nasal Congestion   Abdominal Pain    Virginia Bird is a 42 y.o. female.  Patient presents via EMS as concern for code sepsis.  She has had abdominal pain, nausea and vomiting after eating [place] food last night.  States she vomited twice today was not able to eat or drink anything.  Has had several loose stools are well.  Did have a fever at home up to 102.  She has had a "sinus infection" for the past 1 week and been self-medicating.  She has had nasal congestion, headache and sore throat.  She had an e-visit today was prescribed amoxicillin which she has had 1 dose but the vomiting preceded this.  No one else has been sick.  She was in [place] in August.  Does have diffuse crampy lower abdominal pain and vomited about twice with loose stools as well.  Today was the first day she had fever.  Found to be tachycardic to the 170s for EMS and she received 750 cc of fluids and is now tachycardic in the 150s.  Denies chest pain or shortness of breath.  The history is provided by the patient and the EMS personnel.  Abdominal Pain Associated symptoms: chills, cough, fatigue, fever, nausea, sore throat and vomiting   Associated symptoms: no chest pain, no dysuria, no hematuria and no shortness of breath        Home Medications Prior to Admission medications   Medication Sig Start Date End Date Taking? Authorizing Provider  amoxicillin-clavulanate (AUGMENTIN) 875-125 MG tablet Take 1 tablet by mouth 2 (two) times daily for 7 days. 05/31/23 06/07/23  Claiborne Rigg, NP  EPINEPHrine 0.3 mg/0.3 mL IJ SOAJ injection Inject 0.3 mg into the muscle as needed for anaphylaxis. 04/01/23   Doreene Nest, NP  famotidine (PEPCID) 20 MG tablet TAKE 1  TABLET (20 MG TOTAL) BY MOUTH DAILY. FOR HEARTBURN. 04/08/23   Doreene Nest, NP  ferrous gluconate (FERGON) 324 MG tablet Take 1 tablet (324 mg total) by mouth daily with breakfast. 03/12/22   Anyanwu, Jethro Bastos, MD  fluticasone (FLONASE) 50 MCG/ACT nasal spray Place 1 spray into both nostrils 2 (two) times daily as needed for allergies or rhinitis. 01/15/23   Doreene Nest, NP  naproxen (NAPROSYN) 500 MG tablet Take 1 tablet (500 mg total) by mouth 2 (two) times daily with a meal. As needed for cramping 01/15/23   Doreene Nest, NP      Allergies    Patient has no known allergies.    Review of Systems   Review of Systems  Constitutional:  Positive for activity change, appetite change, chills, fatigue and fever.  HENT:  Positive for congestion, rhinorrhea and sore throat.   Eyes:  Negative for visual disturbance.  Respiratory:  Positive for cough. Negative for shortness of breath.   Cardiovascular:  Negative for chest pain.  Gastrointestinal:  Positive for abdominal pain, nausea and vomiting.  Genitourinary:  Negative for dysuria and hematuria.  Musculoskeletal:  Positive for arthralgias and myalgias.  Skin:  Negative for rash.  Neurological:  Positive for weakness and headaches. Negative for dizziness.   all other systems are negative except as noted in the HPI and PMH.    Physical  Exam Updated Vital Signs BP 123/71 (BP Location: Right Arm)   Pulse (!) 151   Temp (!) 100.8 F (38.2 C) (Oral)   Resp (!) 25   Ht 5\' 2"  (1.575 m)   Wt 73 kg   LMP 04/28/2023   SpO2 99%   BMI 29.45 kg/m  Physical Exam Vitals and nursing note reviewed.  Constitutional:      General: She is not in acute distress.    Appearance: Normal appearance. She is well-developed and normal weight. She is ill-appearing.  HENT:     Head: Normocephalic and atraumatic.     Right Ear: Tympanic membrane normal.     Left Ear: Tympanic membrane normal.     Nose: Congestion present.     Mouth/Throat:      Mouth: Oropharynx is clear and moist. Mucous membranes are moist.     Pharynx: No oropharyngeal exudate.  Eyes:     Extraocular Movements: EOM normal.     Conjunctiva/sclera: Conjunctivae normal.     Pupils: Pupils are equal, round, and reactive to light.  Neck:     Comments: No meningismus. Cardiovascular:     Rate and Rhythm: Regular rhythm. Tachycardia present.     Heart sounds: Normal heart sounds. No murmur heard.    Comments: Tachycardic to 150s.  Pulmonary:     Effort: Pulmonary effort is normal. No respiratory distress.     Breath sounds: Normal breath sounds.  Chest:     Chest wall: No tenderness.  Abdominal:     Palpations: Abdomen is soft.     Tenderness: There is no abdominal tenderness. There is no guarding or rebound.  Musculoskeletal:        General: No tenderness or edema. Normal range of motion.     Cervical back: Normal range of motion and neck supple.  Skin:    General: Skin is warm.     Capillary Refill: Capillary refill takes less than 2 seconds.     Findings: No rash.  Neurological:     General: No focal deficit present.     Mental Status: She is alert and oriented to person, place, and time. Mental status is at baseline.     Cranial Nerves: No cranial nerve deficit.     Motor: No abnormal muscle tone.     Coordination: Coordination normal.     Comments: No ataxia on finger to nose bilaterally. No pronator drift. 5/5 strength throughout. CN 2-12 intact.Equal grip strength. Sensation intact.   Psychiatric:        Mood and Affect: Mood and affect normal.        Behavior: Behavior normal.     ED Results / Procedures / Treatments   Labs (all labs ordered are listed, but only abnormal results are displayed) Labs Reviewed  CULTURE, BLOOD (ROUTINE X 2)  CULTURE, BLOOD (ROUTINE X 2)  RESP PANEL BY RT-PCR (RSV, FLU A&B, COVID)  [id number]  COMPREHENSIVE METABOLIC PANEL  CBC WITH DIFFERENTIAL/PLATELET  URINALYSIS, W/ REFLEX TO CULTURE (INFECTION SUSPECTED)   HCG, SERUM, QUALITATIVE  PROTIME-INR  APTT  I-STAT CG4 LACTIC ACID, ED  I-STAT CG4 LACTIC ACID, ED    EKG EKG Interpretation Date/Time:  Sunday May 31 2023 23:18:13 EDT Ventricular Rate:  156 PR Interval:  99 QRS Duration:  84 QT Interval:  282 QTC Calculation: 444 R Axis:   96  Text Interpretation: Sinus tachycardia Borderline right axis deviation Low voltage, precordial leads Borderline repolarization abnormality Baseline wander in lead(s) V4 V5 No  previous ECGs available Confirmed by Glynn Octave [phone]) on 05/31/2023 11:28:44 PM  Radiology No results found.  Procedures Procedures  {Document cardiac monitor, telemetry assessment procedure when appropriate:1}  Medications Ordered in ED Medications  lactated ringers infusion (has no administration in time range)  lactated ringers bolus 1,000 mL (has no administration in time range)  cefTRIAXone (ROCEPHIN) 2 g in sodium chloride 0.9 % 100 mL IVPB (has no administration in time range)  azithromycin (ZITHROMAX) 500 mg in sodium chloride 0.9 % 250 mL IVPB (has no administration in time range)    ED Course/ Medical Decision Making/ A&P   {   Click here for ABCD2, HEART and other calculatorsREFRESH Note before signing :1}                              Medical Decision Making Amount and/or Complexity of Data Reviewed Independent Historian: EMS Labs: ordered. Decision-making details documented in ED Course. Radiology: ordered and independent interpretation performed. Decision-making details documented in ED Course. ECG/medicine tests: ordered and independent interpretation performed. Decision-making details documented in ED Course.  Risk Prescription drug management.   Patient presents with concern for sepsis.  She is tachycardic, tachypneic, febrile.  Has had nausea vomiting abdominal pain after eating [place] food yesterday.  Also has had URI symptoms with sinus congestion for the past 1 week.  Code sepsis  activated on arrival.  She is given IV fluids and broad-spectrum antibiotics after cultures are obtained.  {Document critical care time when appropriate:1} {Document review of labs and clinical decision tools ie heart score, Chads2Vasc2 etc:1}  {Document your independent review of radiology images, and any outside records:1} {Document your discussion with family members, caretakers, and with consultants:1} {Document social determinants of health affecting pt's care:1} {Document your decision making why or why not admission, treatments were needed:1} Final Clinical Impression(s) / ED Diagnoses Final diagnoses:  None    Rx / DC Orders ED Discharge Orders     None

## 2023-06-01 ENCOUNTER — Emergency Department (HOSPITAL_COMMUNITY): Payer: PRIVATE HEALTH INSURANCE

## 2023-06-01 DIAGNOSIS — A09 Infectious gastroenteritis and colitis, unspecified: Secondary | ICD-10-CM

## 2023-06-01 HISTORY — DX: Infectious gastroenteritis and colitis, unspecified: A09

## 2023-06-01 LAB — COMPREHENSIVE METABOLIC PANEL
ALT: 15 U/L (ref 0–44)
ALT: 21 U/L (ref 0–44)
AST: 18 U/L (ref 15–41)
AST: 36 U/L (ref 15–41)
Albumin: 2.7 g/dL — ABNORMAL LOW (ref 3.5–5.0)
Albumin: 2.6 g/dL — ABNORMAL LOW (ref 3.5–5.0)
Alkaline Phosphatase: 43 U/L (ref 38–126)
Alkaline Phosphatase: 50 U/L (ref 38–126)
Anion gap: 14 (ref 5–15)
Anion gap: 11 (ref 5–15)
BUN: 6 mg/dL (ref 6–20)
BUN: <5 mg/dL — ABNORMAL LOW (ref 6–20)
CO2: 22 mmol/L (ref 22–32)
CO2: 21 mmol/L — ABNORMAL LOW (ref 22–32)
Calcium: 7.9 mg/dL — ABNORMAL LOW (ref 8.9–10.3)
Calcium: 7.8 mg/dL — ABNORMAL LOW (ref 8.9–10.3)
Chloride: 101 mmol/L (ref 98–111)
Chloride: 105 mmol/L (ref 98–111)
Creatinine, Ser: 0.84 mg/dL (ref 0.44–1.00)
Creatinine, Ser: 0.77 mg/dL (ref 0.44–1.00)
GFR, Estimated: >60 mL/min (ref >60)
GFR, Estimated: >60 mL/min (ref >60)
Glucose, Bld: 141 mg/dL — ABNORMAL HIGH (ref 70–99)
Glucose, Bld: 101 mg/dL — ABNORMAL HIGH (ref 70–99)
Potassium: 3.1 mmol/L — ABNORMAL LOW (ref 3.5–5.1)
Potassium: 3.4 mmol/L — ABNORMAL LOW (ref 3.5–5.1)
Sodium: 137 mmol/L (ref 135–145)
Sodium: 137 mmol/L (ref 135–145)
Total Bilirubin: 0.7 mg/dL (ref 0.3–1.2)
Total Bilirubin: 0.6 mg/dL (ref 0.3–1.2)
Total Protein: 6.5 g/dL (ref 6.5–8.1)
Total Protein: 6.2 g/dL — ABNORMAL LOW (ref 6.5–8.1)

## 2023-06-01 LAB — URINALYSIS, W/ REFLEX TO CULTURE (INFECTION SUSPECTED)
Bilirubin Urine: NEGATIVE
Glucose, UA: NEGATIVE mg/dL
Ketones, ur: NEGATIVE mg/dL
Leukocytes,Ua: NEGATIVE
Nitrite: NEGATIVE
Protein, ur: NEGATIVE mg/dL
Specific Gravity, Urine: 1.02 (ref 1.005–1.030)
pH: 6 (ref 5.0–8.0)

## 2023-06-01 LAB — CBC WITH DIFFERENTIAL/PLATELET
Abs Immature Granulocytes: 0.08 10*3/uL — ABNORMAL HIGH (ref 0.00–0.07)
Basophils Absolute: 0 10*3/uL (ref 0.0–0.1)
Basophils Relative: 0 %
Eosinophils Absolute: 0.1 10*3/uL (ref 0.0–0.5)
Eosinophils Relative: 1 %
HCT: 34.3 % — ABNORMAL LOW (ref 36.0–46.0)
Hemoglobin: 10.9 g/dL — ABNORMAL LOW (ref 12.0–15.0)
Immature Granulocytes: 1 %
Lymphocytes Relative: 3 %
Lymphs Abs: 0.3 10*3/uL — ABNORMAL LOW (ref 0.7–4.0)
MCH: 27.4 pg (ref 26.0–34.0)
MCHC: 31.8 g/dL (ref 30.0–36.0)
MCV: 86.2 fL (ref 80.0–100.0)
Monocytes Absolute: 0.1 10*3/uL (ref 0.1–1.0)
Monocytes Relative: 1 %
Neutro Abs: 10.7 10*3/uL — ABNORMAL HIGH (ref 1.7–7.7)
Neutrophils Relative %: 94 %
Platelets: 323 10*3/uL (ref 150–400)
RBC: 3.98 MIL/uL (ref 3.87–5.11)
RDW: 13.7 % (ref 11.5–15.5)
WBC: 11.3 10*3/uL — ABNORMAL HIGH (ref 4.0–10.5)
nRBC: 0 % (ref 0.0–0.2)

## 2023-06-01 LAB — LACTIC ACID, PLASMA
Lactic Acid, Venous: 1.8 mmol/L (ref 0.5–1.9)
Lactic Acid, Venous: 2.1 mmol/L (ref 0.5–1.9)

## 2023-06-01 LAB — I-STAT CG4 LACTIC ACID, ED
Lactic Acid, Venous: 2.1 mmol/L (ref 0.5–1.9)
Lactic Acid, Venous: 2.2 mmol/L (ref 0.5–1.9)

## 2023-06-01 LAB — RESP PANEL BY RT-PCR (RSV, FLU A&B, COVID)  RVPGX2
Influenza A by PCR: NEGATIVE
Influenza B by PCR: NEGATIVE
Resp Syncytial Virus by PCR: NEGATIVE
SARS Coronavirus 2 by RT PCR: NEGATIVE

## 2023-06-01 LAB — HCG, SERUM, QUALITATIVE: Preg, Serum: NEGATIVE

## 2023-06-01 LAB — PREGNANCY, URINE: Preg Test, Ur: NEGATIVE

## 2023-06-01 MED ORDER — POTASSIUM CHLORIDE CRYS ER 20 MEQ PO TBCR
40.0000 meq | EXTENDED_RELEASE_TABLET | Freq: Once | ORAL | Status: AC
  Administered 2023-06-01: 40 meq via ORAL
  Filled 2023-06-01: qty 2

## 2023-06-01 MED ORDER — LACTATED RINGERS IV BOLUS
1000.0000 mL | Freq: Once | INTRAVENOUS | Status: AC
  Administered 2023-06-01: 1000 mL via INTRAVENOUS

## 2023-06-01 MED ORDER — SODIUM CHLORIDE 0.9% FLUSH
3.0000 mL | Freq: Two times a day (BID) | INTRAVENOUS | Status: DC
  Administered 2023-06-01 – 2023-06-08 (×14): 3 mL via INTRAVENOUS

## 2023-06-01 MED ORDER — ONDANSETRON HCL 4 MG/2ML IJ SOLN
4.0000 mg | Freq: Four times a day (QID) | INTRAMUSCULAR | Status: DC | PRN
  Administered 2023-06-05: 4 mg via INTRAVENOUS
  Filled 2023-06-01: qty 2

## 2023-06-01 MED ORDER — ONDANSETRON HCL 4 MG PO TABS
4.0000 mg | ORAL_TABLET | Freq: Four times a day (QID) | ORAL | Status: DC | PRN
  Administered 2023-06-04 (×2): 4 mg via ORAL
  Filled 2023-06-01 (×2): qty 1

## 2023-06-01 MED ORDER — FENTANYL CITRATE PF 50 MCG/ML IJ SOSY
50.0000 ug | PREFILLED_SYRINGE | Freq: Once | INTRAMUSCULAR | Status: AC
  Administered 2023-06-01: 50 ug via INTRAVENOUS
  Filled 2023-06-01: qty 1

## 2023-06-01 MED ORDER — HYDROMORPHONE HCL 1 MG/ML IJ SOLN
1.0000 mg | Freq: Once | INTRAMUSCULAR | Status: AC
  Administered 2023-06-01: 1 mg via INTRAVENOUS
  Filled 2023-06-01: qty 1

## 2023-06-01 MED ORDER — ACETAMINOPHEN 325 MG PO TABS
650.0000 mg | ORAL_TABLET | Freq: Four times a day (QID) | ORAL | Status: DC | PRN
  Administered 2023-06-01 – 2023-06-07 (×13): 650 mg via ORAL
  Filled 2023-06-01 (×13): qty 2

## 2023-06-01 MED ORDER — METRONIDAZOLE 500 MG/100ML IV SOLN
500.0000 mg | Freq: Once | INTRAVENOUS | Status: AC
  Administered 2023-06-01: 500 mg via INTRAVENOUS
  Filled 2023-06-01: qty 100

## 2023-06-01 MED ORDER — ACETAMINOPHEN 650 MG RE SUPP: 650.0000 mg | Freq: Four times a day (QID) | RECTAL | Status: DC | PRN

## 2023-06-01 MED ORDER — ENOXAPARIN SODIUM 40 MG/0.4ML IJ SOSY
40.0000 mg | PREFILLED_SYRINGE | INTRAMUSCULAR | Status: DC
  Administered 2023-06-01 – 2023-06-08 (×6): 40 mg via SUBCUTANEOUS
  Filled 2023-06-01 (×6): qty 0.4

## 2023-06-01 MED ORDER — KETOROLAC TROMETHAMINE 15 MG/ML IJ SOLN
15.0000 mg | Freq: Once | INTRAMUSCULAR | Status: AC
  Administered 2023-06-01: 15 mg via INTRAVENOUS
  Filled 2023-06-01: qty 1

## 2023-06-01 MED ORDER — METRONIDAZOLE 500 MG/100ML IV SOLN
500.0000 mg | Freq: Two times a day (BID) | INTRAVENOUS | Status: DC
  Administered 2023-06-01 – 2023-06-04 (×8): 500 mg via INTRAVENOUS
  Filled 2023-06-01 (×9): qty 100

## 2023-06-01 MED ORDER — HYDROMORPHONE HCL 1 MG/ML IJ SOLN
0.5000 mg | INTRAMUSCULAR | Status: DC | PRN
  Administered 2023-06-01 – 2023-06-02 (×3): 0.5 mg via INTRAVENOUS
  Filled 2023-06-01 (×3): qty 0.5

## 2023-06-01 MED ORDER — IOHEXOL 350 MG/ML SOLN
75.0000 mL | Freq: Once | INTRAVENOUS | Status: AC | PRN
  Administered 2023-06-01: 75 mL via INTRAVENOUS

## 2023-06-01 NOTE — Sepsis Progress Note (Signed)
Notified bedside nurse of need to draw repeat lactic acid.

## 2023-06-01 NOTE — ED Notes (Signed)
ED TO INPATIENT HANDOFF REPORT  ED Nurse Name and Phone #: Morrie Sheldon [phone]  J Name/Age/Gender Virginia Bird 42 y.o. female Room/Bed: 025C/025C  Code Status   Code Status: Full Code  Home/SNF/Other Home Patient oriented to: self, place, time, and situation Is this baseline? Yes   Triage Complete: Triage complete  Chief Complaint Infectious enteritis [A09]  Triage Note Pt arrived from home via [facility] with a sinus infection x1 week. Pt has been self medicating. Had an Evisit today,Amoxicillin started today but developed a fever 102.  Initial vital signs with EMS 178 HR 140/80 28 CBG 148  Pt took tylenol 20ml , 650mg  and naproxen.  After fluid HR 148 136/82   Allergies No Known Allergies  Level of Care/Admitting Diagnosis ED Disposition     ED Disposition  Admit   Condition  --   Comment  Hospital Area: [facility] [[id number]]  Level of Care: Med-Surg [16]  May place patient in observation at [facility] or [facility] if equivalent level of care is available:: Yes  Covid Evaluation: Confirmed COVID Negative  Diagnosis: Infectious enteritis [191478]  Admitting Physician: Hughie Closs [[id number]]  Attending Physician: Hughie Closs [[id number]]          B Medical/Surgery History 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   HEMORRHOID SURGERY N/A 11/08/2021   Procedure: HEMORRHOIDECTOMY;  Surgeon: Campbell Lerner, MD;  Location: [facility] ORS;  Service: General;  Laterality: N/A;   RECTAL EXAM UNDER ANESTHESIA N/A 11/08/2021   Procedure: RECTAL EXAM UNDER ANESTHESIA;  Surgeon: Campbell Lerner, MD;  Location: [facility] ORS;   Service: General;  Laterality: N/A;   WISDOM TOOTH EXTRACTION       A IV Location/Drains/Wounds Patient Lines/Drains/Airways Status     Active Line/Drains/Airways     Name Placement date Placement time Site Days   Peripheral IV 05/31/23 20 G Left Antecubital 05/31/23  2323  Antecubital  1   Peripheral IV 05/31/23 20 G 1" Anterior;Right Forearm 05/31/23  2312  Forearm  1            Intake/Output Last 24 hours  Intake/Output Summary (Last 24 hours) at 06/01/2023 0855 Last data filed at 06/01/2023 0301 Gross per 24 hour  Intake 2350 ml  Output --  Net 2350 ml    Labs/Imaging Results for orders placed or performed during the hospital encounter of 05/31/23 (from the past 48 hour(s))  Comprehensive metabolic panel     Status: Abnormal   Collection Time: 05/31/23 11:15 PM  Result Value Ref Range   Sodium 137 135 - 145 mmol/L   Potassium 3.1 (L) 3.5 - 5.1 mmol/L   Chloride 101 98 - 111 mmol/L   CO2 22 22 - 32 mmol/L   Glucose, Bld 141 (H) 70 - 99 mg/dL    Comment: Glucose reference range applies only to samples taken after fasting for at least 8 hours.   BUN 6 6 - 20 mg/dL   Creatinine, Ser 6.96 0.44 - 1.00 mg/dL   Calcium 7.9 (L) 8.9 - 10.3 mg/dL   Total Protein 6.5 6.5 - 8.1 g/dL   Albumin  2.7 (L) 3.5 - 5.0 g/dL   AST 18 15 - 41 U/L   ALT 15 0 - 44 U/L   Alkaline Phosphatase 43 38 - 126 U/L   Total Bilirubin 0.7 0.3 - 1.2 mg/dL   GFR, Estimated >78 >29 mL/min    Comment: (NOTE) Calculated using the CKD-EPI Creatinine Equation (2021)    Anion gap 14 5 - 15    Comment: Performed at [facility], [address] [place], [place] [address]  CBC with Differential     Status: Abnormal   Collection Time: 05/31/23 11:15 PM  Result Value Ref Range   WBC 9.9 4.0 - 10.5 K/uL   RBC 3.77 (L) 3.87 - 5.11 MIL/uL   Hemoglobin 9.8 (L) 12.0 - 15.0 g/dL   HCT 08.6 (L) 57.8 - 46.9 %   MCV 84.4 80.0 - 100.0 fL   MCH 26.0 26.0 - 34.0 pg   MCHC 30.8 30.0 - 36.0 g/dL   RDW  62.9 52.8 - 41.3 %   Platelets 344 150 - 400 K/uL   nRBC 0.0 0.0 - 0.2 %   Neutrophils Relative % 97 %   Neutro Abs 9.6 (H) 1.7 - 7.7 K/uL   Lymphocytes Relative 3 %   Lymphs Abs 0.3 (L) 0.7 - 4.0 K/uL   Monocytes Relative 0 %   Monocytes Absolute 0.0 (L) 0.1 - 1.0 K/uL   Eosinophils Relative 0 %   Eosinophils Absolute 0.0 0.0 - 0.5 K/uL   Basophils Relative 0 %   Basophils Absolute 0.0 0.0 - 0.1 K/uL   Immature Granulocytes 0 %   Abs Immature Granulocytes 0.04 0.00 - 0.07 K/uL    Comment: Performed at [facility], [address], [place], [place] [address]  Blood culture (routine x 2)     Status: None (Preliminary result)   Collection Time: 05/31/23 11:15 PM   Specimen: BLOOD  Result Value Ref Range   Specimen Description BLOOD LEFT ANTECUBITAL    Special Requests      BOTTLES DRAWN AEROBIC AND ANAEROBIC Blood Culture results may not be optimal due to an excessive volume of blood received in culture bottles   Culture      NO GROWTH < 12 HOURS Performed at [facility], [address] [place], [place] [address]    Report Status PENDING   Protime-INR     Status: None   Collection Time: 05/31/23 11:15 PM  Result Value Ref Range   Prothrombin Time 15.2 11.4 - 15.2 seconds   INR 1.2 0.8 - 1.2    Comment: (NOTE) INR goal varies based on device and disease states. Performed at [facility], [address], [place], [place] [address]   APTT     Status: None   Collection Time: 05/31/23 11:15 PM  Result Value Ref Range   aPTT 28 24 - 36 seconds    Comment: Performed at [facility], [address], [place], [place] [address]  Resp panel by RT-PCR (RSV, Flu A&B, Covid) Peripheral     Status: None   Collection Time: 05/31/23 11:20 PM   Specimen: Peripheral; Nasal Swab  Result Value Ref Range   SARS Coronavirus 2 by RT PCR NEGATIVE NEGATIVE   Influenza A by PCR NEGATIVE NEGATIVE   Influenza B by PCR NEGATIVE NEGATIVE    Comment: (NOTE) The Xpert Xpress  SARS-CoV-2/FLU/RSV plus assay is intended as an aid in the diagnosis of influenza from Nasopharyngeal swab  specimens and should not be used as a sole basis for treatment. Nasal washings and aspirates are unacceptable for Xpert Xpress SARS-CoV-2/FLU/RSV testing.  Fact Sheet for Patients: BloggerCourse.com  Fact Sheet for Healthcare Providers: SeriousBroker.it  This test is not yet approved or cleared by the [place] FDA and has been authorized for detection and/or diagnosis of SARS-CoV-2 by FDA under an Emergency Use Authorization (EUA). This EUA will remain in effect (meaning this test can be used) for the duration of the COVID-19 declaration under Section 564(b)(1) of the Act, 21 U.S.C. section 360bbb-3(b)(1), unless the authorization is terminated or revoked.     Resp Syncytial Virus by PCR NEGATIVE NEGATIVE    Comment: (NOTE) Fact Sheet for Patients: BloggerCourse.com  Fact Sheet for Healthcare Providers: SeriousBroker.it  This test is not yet approved or cleared by the [place] FDA and has been authorized for detection and/or diagnosis of SARS-CoV-2 by FDA under an Emergency Use Authorization (EUA). This EUA will remain in effect (meaning this test can be used) for the duration of the COVID-19 declaration under Section 564(b)(1) of the Act, 21 U.S.C. section 360bbb-3(b)(1), unless the authorization is terminated or revoked.  Performed at [facility], [address], [place], [place] [address]   Blood culture (routine x 2)     Status: None (Preliminary result)   Collection Time: 05/31/23 11:30 PM   Specimen: BLOOD RIGHT FOREARM  Result Value Ref Range   Specimen Description BLOOD RIGHT FOREARM    Special Requests      BOTTLES DRAWN AEROBIC ONLY Blood Culture results may not be optimal due to an inadequate volume of blood received in culture bottles    Culture      NO GROWTH < 12 HOURS Performed at [facility], [address] [place], [place] [address]    Report Status PENDING   I-Stat Lactic Acid, ED     Status: Abnormal   Collection Time: 05/31/23 11:37 PM  Result Value Ref Range   Lactic Acid, Venous 2.2 (HH) 0.5 - 1.9 mmol/L  Urinalysis, w/ Reflex to Culture (Infection Suspected) -Urine, Clean Catch     Status: Abnormal   Collection Time: 06/01/23  1:30 AM  Result Value Ref Range   Specimen Source URINE, CLEAN CATCH    Color, Urine YELLOW YELLOW   APPearance HAZY (A) CLEAR   Specific Gravity, Urine 1.020 1.005 - 1.030   pH 6.0 5.0 - 8.0   Glucose, UA NEGATIVE NEGATIVE mg/dL   Hgb urine dipstick TRACE (A) NEGATIVE   Bilirubin Urine NEGATIVE NEGATIVE   Ketones, ur NEGATIVE NEGATIVE mg/dL   Protein, ur NEGATIVE NEGATIVE mg/dL   Nitrite NEGATIVE NEGATIVE   Leukocytes,Ua NEGATIVE NEGATIVE   Squamous Epithelial / HPF 0-5 0 - 5 /HPF   WBC, UA 0-5 0 - 5 WBC/hpf    Comment: Reflex urine culture not performed if WBC <=10, OR if Squamous epithelial cells >5. If Squamous epithelial cells >5, suggest recollection.   RBC / HPF 0-5 0 - 5 RBC/hpf   Bacteria, UA RARE (A) NONE SEEN   Mucus PRESENT     Comment: Performed at [facility], [address], [place], [place] [address]  Pregnancy, urine     Status: None   Collection Time: 06/01/23  1:30 AM  Result Value Ref Range   Preg Test, Ur NEGATIVE NEGATIVE    Comment:        THE SENSITIVITY OF THIS METHODOLOGY IS >25 mIU/mL. Performed at  [facility], [address] [place], [place], [place] [address]   I-Stat Lactic Acid, ED     Status: Abnormal   Collection Time: 06/01/23  5:25 AM  Result Value Ref Range   Lactic Acid, Venous 2.1 (HH) 0.5 - 1.9 mmol/L   Comment NOTIFIED PHYSICIAN    CT ABDOMEN PELVIS W CONTRAST  Result Date: 06/01/2023 CLINICAL DATA:  Sepsis.  Acute abdominal pain EXAM: CT ABDOMEN AND PELVIS WITH CONTRAST TECHNIQUE: Multidetector CT imaging of the  abdomen and pelvis was performed using the standard protocol following bolus administration of intravenous contrast. RADIATION DOSE REDUCTION: This exam was performed according to the departmental dose-optimization program which includes automated exposure control, adjustment of the mA and/or kV according to patient size and/or use of iterative reconstruction technique. CONTRAST:  75mL OMNIPAQUE IOHEXOL 350 MG/ML SOLN COMPARISON:  01/17/2009 FINDINGS: Lower chest: Streaky density at the lung bases attributed atelectasis. Hepatobiliary: No focal liver abnormality.No evidence of biliary obstruction or stone. Pancreas: Unremarkable. Spleen: Unremarkable. Adrenals/Urinary Tract: Negative adrenals. No hydronephrosis or stone. Unremarkable bladder. Stomach/Bowel: Thick walled terminal ileum with wall thickening and enhancement greatest just before the ileocecal valve. More proximally there is low-density submucosal expansion. The regional mesentery is edematous and there is ileocolic mild lymph node enlargement without cavitation. Small volume regional peritoneal fluid Vascular/Lymphatic: No acute vascular abnormality. No mass or adenopathy. Reproductive:Enlarged uterus with multiple myometrial masses, the largest in the right body measuring nearly 9 cm. Dominant follicle in the left ovary. Other: No generalized ascites or pneumoperitoneum. Musculoskeletal: No acute abnormalities. IMPRESSION: 1. Thickening of the terminal ileum which could be inflammatory bowel disease or infectious enteritis. Greater mural enhancement with some indistinctness of wall just before the ileocecal valve, suggest follow-up imaging to exclude mass (not favored) or developing wall breakdown/penetrating disease. 2. Enlarged and fibroid uterus. Electronically Signed   By: Tiburcio Pea M.D.   On: 06/01/2023 04:23   DG Chest Port 1 View  Result Date: 05/31/2023 CLINICAL DATA:  Sinus infection.  Self medicating.  Fever. EXAM: PORTABLE CHEST 1  VIEW COMPARISON:  None Available. FINDINGS: Normal cardiomediastinal silhouette. No focal consolidation, pleural effusion, or pneumothorax. No displaced rib fractures. IMPRESSION: No acute cardiopulmonary disease. Electronically Signed   By: Minerva Fester M.D.   On: 05/31/2023 23:45    Pending Labs Unresulted Labs (From admission, onward)     Start     Ordered   06/08/23 0500  Creatinine, serum  (enoxaparin (LOVENOX)    CrCl >/= 30 ml/min)  Weekly,   R     Comments: while on enoxaparin therapy    06/01/23 0849   06/02/23 0500  Comprehensive metabolic panel  Tomorrow morning,   R        06/01/23 0849   06/02/23 0500  CBC  Tomorrow morning,   R        06/01/23 0849   06/01/23 0851  Comprehensive metabolic panel  Once,   R        06/01/23 0850   06/01/23 0851  CBC with Differential/Platelet  Once,   R        06/01/23 0850   06/01/23 0851  Lactic acid, plasma  (Lactic Acid)  STAT Now then every 3 hours,   R (with STAT occurrences)      06/01/23 0850   06/01/23 0849  CBC  (enoxaparin (LOVENOX)    CrCl >/= 30 ml/min)  Once,   R       Comments: Baseline for enoxaparin therapy IF  NOT ALREADY DRAWN.  Notify MD if PLT < 100 K.    06/01/23 0849   05/31/23 2340  hCG, serum, qualitative  Once,   R        05/31/23 2340            Vitals/Pain Today's Vitals   06/01/23 0718 06/01/23 0800 06/01/23 0822 06/01/23 0835  BP:  107/77    Pulse:    (!) 103  Resp:    (!) 28  Temp:  99.1 F (37.3 C)    TempSrc:  Oral    SpO2:    100%  Weight:      Height:      PainSc: 6   3      Isolation Precautions No active isolations  Medications Medications  lactated ringers infusion ( Intravenous New Bag/Given 06/01/23 0717)  cefTRIAXone (ROCEPHIN) 2 g in sodium chloride 0.9 % 100 mL IVPB (0 g Intravenous Stopped 06/01/23 0017)  azithromycin (ZITHROMAX) 500 mg in sodium chloride 0.9 % 250 mL IVPB (0 mg Intravenous Stopped 06/01/23 0245)  enoxaparin (LOVENOX) injection 40 mg (has no administration  in time range)  sodium chloride flush (NS) 0.9 % injection 3 mL (has no administration in time range)  acetaminophen (TYLENOL) tablet 650 mg (has no administration in time range)    Or  acetaminophen (TYLENOL) suppository 650 mg (has no administration in time range)  ondansetron (ZOFRAN) tablet 4 mg (has no administration in time range)    Or  ondansetron (ZOFRAN) injection 4 mg (has no administration in time range)  HYDROmorphone (DILAUDID) injection 0.5 mg (has no administration in time range)  lactated ringers bolus 1,000 mL (0 mLs Intravenous Stopped 06/01/23 0114)  lactated ringers bolus 1,000 mL (0 mLs Intravenous Stopped 06/01/23 0301)  ketorolac (TORADOL) 15 MG/ML injection 15 mg (15 mg Intravenous Given 06/01/23 0139)  fentaNYL (SUBLIMAZE) injection 50 mcg (50 mcg Intravenous Given 06/01/23 0139)  lactated ringers bolus 1,000 mL (0 mLs Intravenous Stopped 06/01/23 0357)  iohexol (OMNIPAQUE) 350 MG/ML injection 75 mL (75 mLs Intravenous Contrast Given 06/01/23 0403)  metroNIDAZOLE (FLAGYL) IVPB 500 mg (0 mg Intravenous Stopped 06/01/23 0654)  HYDROmorphone (DILAUDID) injection 1 mg (1 mg Intravenous Given 06/01/23 1610)    Mobility walks     Focused Assessments See chart   R Recommendations: See Admitting Provider Note  Report given to:   Additional Notes: see chart

## 2023-06-01 NOTE — Plan of Care (Signed)
  Problem: Education: Goal: Knowledge of General Education information will improve Description: Including pain rating scale, medication(s)/side effects and non-pharmacologic comfort measures 06/01/2023 1844 by Maceo Pro, RN Outcome: Progressing 06/01/2023 1844 by Maceo Pro, RN Outcome: Progressing   Problem: Health Behavior/Discharge Planning: Goal: Ability to manage health-related needs will improve 06/01/2023 1844 by Maceo Pro, RN Outcome: Progressing 06/01/2023 1844 by Maceo Pro, RN Outcome: Progressing   Problem: Clinical Measurements: Goal: Ability to maintain clinical measurements within normal limits will improve 06/01/2023 1844 by Maceo Pro, RN Outcome: Progressing 06/01/2023 1844 by Maceo Pro, RN Outcome: Progressing   Problem: Clinical Measurements: Goal: Will remain free from infection 06/01/2023 1844 by Maceo Pro, RN Outcome: Progressing 06/01/2023 1844 by Maceo Pro, RN Outcome: Progressing   Problem: Clinical Measurements: Goal: Diagnostic test results will improve 06/01/2023 1844 by Maceo Pro, RN Outcome: Progressing 06/01/2023 1844 by Maceo Pro, RN Outcome: Progressing   Problem: Activity: Goal: Risk for activity intolerance will decrease 06/01/2023 1844 by Maceo Pro, RN Outcome: Progressing 06/01/2023 1844 by Maceo Pro, RN Outcome: Progressing   Problem: Nutrition: Goal: Adequate nutrition will be maintained 06/01/2023 1844 by Maceo Pro, RN Outcome: Progressing 06/01/2023 1844 by Maceo Pro, RN Outcome: Progressing   Problem: Coping: Goal: Level of anxiety will decrease 06/01/2023 1844 by Maceo Pro, RN Outcome: Progressing 06/01/2023 1844 by Maceo Pro, RN Outcome: Progressing

## 2023-06-01 NOTE — ED Notes (Addendum)
Pt oxygen 88% Pt put on 2 liters  I will notify the Nurse

## 2023-06-01 NOTE — Sepsis Progress Note (Signed)
Elink monitoring for the code sepsis protocol.  

## 2023-06-01 NOTE — Sepsis Progress Note (Signed)
Messaged Bedside RN again about need for 2nd lactic acid

## 2023-06-01 NOTE — H&P (Signed)
History and Physical    Patient: Virginia Bird NFA:[id number] DOB: 06-17-1981 DOA: 05/31/2023 DOS: the patient was seen and examined on 06/01/2023 PCP: Doreene Nest, NP  Patient coming from: Home  Chief Complaint:  Chief Complaint  Patient presents with   Nasal Congestion   Abdominal Pain   HPI: Virginia Bird is a 42 y.o. female with medical history significant of cervical dysplasia, uterine fibroids with dysfunctional uterine bleeding and associated iron deficiency anemia, and GERD.  Patient presented to the hospital with new onset nausea vomiting and loose stools as well as fever at home.  She reported fever at home up to 102 degrees.  On the date of presentation patient had an e-visit with her primary care doctor and was diagnosed with acute sinusitis.  She was started on Augmentin but had only taken 1 dose.  Upon evaluation in the ED her Tmax was 100.8, she was tachycardic, her potassium was 3.1, white count was normal, lactic acid was elevated at 2.2 and 2.1, urinalysis was unremarkable.  Having significant abdominal pain described as level 7/10.  She underwent a CT of the abdomen and pelvis that showed a thickened terminal ileum consistent with either inflammatory bowel disease versus infectious enteritis.  She was also found to have atelectasis of her lower lungs but has not been having any coughing or other URI symptoms.  Hospitalist has been asked to evaluate the patient for admission.  In further discussion with the patient she clarified that she had eaten some [place] food within the previous 24 hours and about 3 to 4 hours after that developed emesis and significant nausea.  She had significant periumbilical abdominal pain and loose stools.  She describes the pain as waxing and waning and associated with abdominal distention.  She reports she was given unknown antibiotics about 2 to 3 weeks ago for similar symptoms.  She has not been referred to her primary  gastroenterologist Dr. Allegra Lai after onset of the symptoms.  She has had no bleeding.  Since onset of current symptoms she has not passed any flatus.   Review of Systems: As mentioned in the history of present illness. All other systems reviewed and are negative. 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 She has a 2.5 pack-year smoking history. She has never used smokeless tobacco. She reports current alcohol use. She reports that she does not use drugs.  No Known Allergies  Family History  Problem Relation Age of Onset   Thyroid disease Mother    Hypertension Mother    Cancer Father        leukemia   Stroke Father    Cancer Maternal Aunt  Breast Cancer   Breast cancer Maternal Aunt    Asthma Sister    Miscarriages / [place] Sister     Prior to Admission medications   Medication Sig Start Date End Date Taking? Authorizing Provider  EPINEPHrine 0.3 mg/0.3 mL IJ SOAJ injection Inject 0.3 mg into the muscle as needed for anaphylaxis. 04/01/23  Yes Doreene Nest, NP  famotidine (PEPCID) 20 MG tablet TAKE 1 TABLET (20 MG TOTAL) BY MOUTH DAILY. FOR HEARTBURN. 04/08/23  Yes Doreene Nest, NP  fluticasone (FLONASE) 50 MCG/ACT  nasal spray Place 1 spray into both nostrils 2 (two) times daily as needed for allergies or rhinitis. 01/15/23  Yes Doreene Nest, NP  naproxen (NAPROSYN) 500 MG tablet Take 1 tablet (500 mg total) by mouth 2 (two) times daily with a meal. As needed for cramping Patient taking differently: Take 500 mg by mouth daily as needed (for cramping). 01/15/23  Yes Doreene Nest, NP  amoxicillin-clavulanate (AUGMENTIN) 875-125 MG tablet Take 1 tablet by mouth 2 (two) times daily for 7 days. Patient not taking: Reported on 06/01/2023 05/31/23 06/07/23  Claiborne Rigg, NP  ferrous gluconate (FERGON) 324 MG tablet Take 1 tablet (324 mg total) by mouth daily with breakfast. Patient not taking: Reported on 06/01/2023 03/12/22   Tereso Newcomer, MD    Physical Exam: Vitals:   06/01/23 0718 06/01/23 0800 06/01/23 0835 06/01/23 0900  BP:  107/77    Pulse:   (!) 103 99  Resp:  (!) 24 (!) 21 20  Temp:  99.1 F (37.3 C)    TempSrc:  Oral    SpO2: 97%  100% 99%  Weight:      Height:       Constitutional: NAD, calm, comfortable after receiving pain medications Respiratory: clear to auscultation bilaterally, no wheezing, no crackles. Normal respiratory effort. No accessory muscle use.  Room air Cardiovascular: Regular rate and rhythm, no murmurs / rubs / gallops. No extremity edema. 2+ pedal pulses.  Abdomen: Currently nontender after multiple pain medications, no masses palpated. No hepatosplenomegaly. Bowel sounds positive but hypoactive.  Musculoskeletal: no clubbing / cyanosis. No joint deformity upper and lower extremities. Good ROM, no contractures. Normal muscle tone.  Skin: no rashes, lesions, ulcers. No induration Neurologic: CN 2-12 grossly intact. Sensation intact,  Strength 5/5 x all 4 extremities.  Psychiatric: Normal judgment and insight. Alert and oriented x 3. Normal mood.     Data Reviewed:  Sodium 137, potassium 3.1, CO2 22, glucose 141, BUN 6, creatinine 0.84, LFTs are  normal  Lactic acid 2.2 and 2.1  White count 9900 with elevated neutrophils, hemoglobin 9.8, platelets 344,000, coags are normal  Urine pregnancy is negative  Respiratory panel/PCR negative for flu, RSV or COVID  Urinalysis unremarkable not consistent with UTI  Blood cultures are pending  CT abdomen and pelvis:Thickening of the terminal ileum which could be inflammatory bowel disease or infectious enteritis. Greater mural enhancement with some indistinctness of wall just before the ileocecal valve, suggest follow-up imaging to exclude mass (not favored) or developing wall breakdown/penetrating disease.  Portable chest x-ray: No acute processes identified  Assessment and Plan: Small bowel enteritis with associated SIRS physiology Based on patient report this is the second episode of similar symptoms in less than 30 days Continue broad-spectrum antibiotics: Rocephin and Flagyl; no evidence of pneumonia so can discontinue azithromycin Continue low-dose IV Dilaudid for pain Allow for clear liquids but continue IV fluids Discussed via secure chat with patient's gastroenterologist Dr.  Erasmo Downer states she is happy to see the patient after discharge given concerns over possible IBD  Mild acute hypokalemia Repeat labs after infusion with lactated Ringer's and give additional potassium if necessary Repeat labs again in the morning  History of uterine fibroids with dysfunctional uterine bleeding Chronic iron deficiency anemia Hemoglobin stable and at baseline    Advance Care Planning:   Code Status: Full Code   VTE prophylaxis: Lovenox  Consults: Dr. Vanga/gastroenterology feel secure chat discussion  Family Communication: Patient only  Severity of Illness: The appropriate patient status for this patient is OBSERVATION. Observation status is judged to be reasonable and necessary in order to provide the required intensity of service to ensure the patient's safety. The patient's  presenting symptoms, physical exam findings, and initial radiographic and laboratory data in the context of their medical condition is felt to place them at decreased risk for further clinical deterioration. Furthermore, it is anticipated that the patient will be medically stable for discharge from the hospital within 2 midnights of admission.   Author: Junious Silk, NP 06/01/2023 9:45 AM  For on call review [email or web address].

## 2023-06-02 DIAGNOSIS — A419 Sepsis, unspecified organism: Secondary | ICD-10-CM | POA: Diagnosis present

## 2023-06-02 DIAGNOSIS — F17210 Nicotine dependence, cigarettes, uncomplicated: Secondary | ICD-10-CM | POA: Diagnosis present

## 2023-06-02 DIAGNOSIS — D259 Leiomyoma of uterus, unspecified: Secondary | ICD-10-CM | POA: Diagnosis present

## 2023-06-02 DIAGNOSIS — R112 Nausea with vomiting, unspecified: Secondary | ICD-10-CM | POA: Diagnosis not present

## 2023-06-02 DIAGNOSIS — D500 Iron deficiency anemia secondary to blood loss (chronic): Secondary | ICD-10-CM | POA: Diagnosis present

## 2023-06-02 DIAGNOSIS — A09 Infectious gastroenteritis and colitis, unspecified: Secondary | ICD-10-CM | POA: Diagnosis present

## 2023-06-02 DIAGNOSIS — R030 Elevated blood-pressure reading, without diagnosis of hypertension: Secondary | ICD-10-CM | POA: Diagnosis not present

## 2023-06-02 DIAGNOSIS — E871 Hypo-osmolality and hyponatremia: Secondary | ICD-10-CM | POA: Diagnosis present

## 2023-06-02 DIAGNOSIS — N938 Other specified abnormal uterine and vaginal bleeding: Secondary | ICD-10-CM | POA: Diagnosis present

## 2023-06-02 DIAGNOSIS — Z79899 Other long term (current) drug therapy: Secondary | ICD-10-CM | POA: Diagnosis not present

## 2023-06-02 DIAGNOSIS — R519 Headache, unspecified: Secondary | ICD-10-CM | POA: Diagnosis not present

## 2023-06-02 DIAGNOSIS — Z1152 Encounter for screening for COVID-19: Secondary | ICD-10-CM | POA: Diagnosis not present

## 2023-06-02 DIAGNOSIS — J189 Pneumonia, unspecified organism: Secondary | ICD-10-CM | POA: Diagnosis present

## 2023-06-02 DIAGNOSIS — E872 Acidosis, unspecified: Secondary | ICD-10-CM | POA: Diagnosis present

## 2023-06-02 DIAGNOSIS — K219 Gastro-esophageal reflux disease without esophagitis: Secondary | ICD-10-CM | POA: Diagnosis present

## 2023-06-02 DIAGNOSIS — R6520 Severe sepsis without septic shock: Secondary | ICD-10-CM | POA: Insufficient documentation

## 2023-06-02 DIAGNOSIS — E876 Hypokalemia: Secondary | ICD-10-CM | POA: Insufficient documentation

## 2023-06-02 DIAGNOSIS — Z87410 Personal history of cervical dysplasia: Secondary | ICD-10-CM | POA: Diagnosis not present

## 2023-06-02 DIAGNOSIS — Z8616 Personal history of COVID-19: Secondary | ICD-10-CM | POA: Diagnosis not present

## 2023-06-02 LAB — COMPREHENSIVE METABOLIC PANEL
ALT: 29 U/L (ref 0–44)
AST: 47 U/L — ABNORMAL HIGH (ref 15–41)
Albumin: 2.2 g/dL — ABNORMAL LOW (ref 3.5–5.0)
Alkaline Phosphatase: 47 U/L (ref 38–126)
Anion gap: 9 (ref 5–15)
BUN: <5 mg/dL — ABNORMAL LOW (ref 6–20)
CO2: 22 mmol/L (ref 22–32)
Calcium: 7.5 mg/dL — ABNORMAL LOW (ref 8.9–10.3)
Chloride: 100 mmol/L (ref 98–111)
Creatinine, Ser: 0.91 mg/dL (ref 0.44–1.00)
GFR, Estimated: >60 mL/min (ref >60)
Glucose, Bld: 88 mg/dL (ref 70–99)
Potassium: 2.9 mmol/L — ABNORMAL LOW (ref 3.5–5.1)
Sodium: 131 mmol/L — ABNORMAL LOW (ref 135–145)
Total Bilirubin: 0.6 mg/dL (ref 0.3–1.2)
Total Protein: 5.4 g/dL — ABNORMAL LOW (ref 6.5–8.1)

## 2023-06-02 LAB — CBC
HCT: 27.8 % — ABNORMAL LOW (ref 36.0–46.0)
Hemoglobin: 8.9 g/dL — ABNORMAL LOW (ref 12.0–15.0)
MCH: 26.6 pg (ref 26.0–34.0)
MCHC: 32 g/dL (ref 30.0–36.0)
MCV: 83.2 fL (ref 80.0–100.0)
Platelets: 276 10*3/uL (ref 150–400)
RBC: 3.34 MIL/uL — ABNORMAL LOW (ref 3.87–5.11)
RDW: 13.6 % (ref 11.5–15.5)
WBC: 5.4 10*3/uL (ref 4.0–10.5)
nRBC: 0 % (ref 0.0–0.2)

## 2023-06-02 LAB — LACTIC ACID, PLASMA: Lactic Acid, Venous: 0.7 mmol/L (ref 0.5–1.9)

## 2023-06-02 MED ORDER — KETOROLAC TROMETHAMINE 30 MG/ML IJ SOLN
30.0000 mg | Freq: Four times a day (QID) | INTRAMUSCULAR | Status: DC | PRN
  Administered 2023-06-02 – 2023-06-03 (×2): 30 mg via INTRAVENOUS
  Filled 2023-06-02 (×3): qty 1

## 2023-06-02 MED ORDER — POTASSIUM CHLORIDE CRYS ER 20 MEQ PO TBCR: 40.0000 meq | EXTENDED_RELEASE_TABLET | Freq: Once | ORAL | Status: DC

## 2023-06-02 MED ORDER — POTASSIUM CHLORIDE CRYS ER 20 MEQ PO TBCR
40.0000 meq | EXTENDED_RELEASE_TABLET | ORAL | Status: AC
  Administered 2023-06-02 (×2): 40 meq via ORAL
  Filled 2023-06-02 (×2): qty 2

## 2023-06-02 MED ORDER — POTASSIUM CHLORIDE 10 MEQ/100ML IV SOLN
10.0000 meq | INTRAVENOUS | Status: AC
  Administered 2023-06-03 (×3): 10 meq via INTRAVENOUS
  Filled 2023-06-02 (×2): qty 100

## 2023-06-02 NOTE — Plan of Care (Signed)

## 2023-06-02 NOTE — Plan of Care (Signed)
  Problem: Education: Goal: Knowledge of General Education information will improve Description: Including pain rating scale, medication(s)/side effects and non-pharmacologic comfort measures Outcome: Progressing   Problem: Health Behavior/Discharge Planning: Goal: Ability to manage health-related needs will improve Outcome: Progressing   Problem: Clinical Measurements: Goal: Ability to maintain clinical measurements within normal limits will improve Outcome: Progressing   Problem: Clinical Measurements: Goal: Will remain free from infection Outcome: Progressing   Problem: Clinical Measurements: Goal: Diagnostic test results will improve Outcome: Progressing   Problem: Pain Managment: Goal: General experience of comfort will improve Outcome: Progressing

## 2023-06-02 NOTE — Progress Notes (Signed)
PROGRESS NOTE    BAHATI ZETTEL  NGE:[id number] DOB: 1981/04/22 DOA: 05/31/2023 PCP: Doreene Nest, NP   Brief Narrative:  HPI: Virginia Bird is a 42 y.o. female with medical history significant of cervical dysplasia, uterine fibroids with dysfunctional uterine bleeding and associated iron deficiency anemia, and GERD.  Patient presented to the hospital with new onset nausea vomiting and loose stools as well as fever at home.  She reported fever at home up to 102 degrees.  On the date of presentation patient had an e-visit with her primary care doctor and was diagnosed with acute sinusitis.  She was started on Augmentin but had only taken 1 dose.   Upon evaluation in the ED her Tmax was 100.8, she was tachycardic, her potassium was 3.1, white count was normal, lactic acid was elevated at 2.2 and 2.1, urinalysis was unremarkable.  Having significant abdominal pain described as level 7/10.  She underwent a CT of the abdomen and pelvis that showed a thickened terminal ileum consistent with either inflammatory bowel disease versus infectious enteritis.  She was also found to have atelectasis of her lower lungs but has not been having any coughing or other URI symptoms.  Hospitalist has been asked to evaluate the patient for admission.   In further discussion with the patient she clarified that she had eaten some [place] food within the previous 24 hours and about 3 to 4 hours after that developed emesis and significant nausea.  She had significant periumbilical abdominal pain and loose stools.  She describes the pain as waxing and waning and associated with abdominal distention.  She reports she was given unknown antibiotics about 2 to 3 weeks ago for similar symptoms.  She has not been referred to her primary gastroenterologist Dr. Allegra Lai after onset of the symptoms.  She has had no bleeding.  Since onset of current symptoms she has not passed any flatus.  Assessment & Plan:   Principal  Problem:   Infectious enteritis Active Problems:   Iron deficiency anemia   Dysfunctional uterine bleeding   Severe sepsis (HCC)   Hypokalemia  Severe sepsis secondary to small bowel enteritis, POA: Met severe sepsis criteria based on fever 100.8, tachycardia, leukocytosis and lactic acid of> 2.  Lactic acidosis resolved as well as leukocytosis however patient just had fever of 101.3.  She said that her symptoms are improving, no abdominal pain and nausea.  She had not tried soft diet that I ordered in the morning.  Would continue current antibiotics and monitor.   Hypokalemia: Very low.  Will replenish.   History of uterine fibroids with dysfunctional uterine bleeding Chronic iron deficiency anemia Hemoglobin stable and at baseline  Hyponatremia: 131.  Monitor.  DVT prophylaxis: enoxaparin (LOVENOX) injection 40 mg Start: 06/01/23 1000   Code Status: Full Code  Family Communication:  None present at bedside.  Plan of care discussed with patient in length and he/she verbalized understanding and agreed with it.  Status is: Inpatient Remains inpatient appropriate because: Febrile with electrolyte abnormalities.   Estimated body mass index is 29.45 kg/m as calculated from the following:   Height as of this encounter: 5\' 2"  (1.575 m).   Weight as of this encounter: 73 kg.    Nutritional Assessment: Body mass index is 29.45 kg/m.Marland Kitchen Seen by dietician.  I agree with the assessment and plan as outlined below: Nutrition Status:        . Skin Assessment: I have examined the patient's skin and I agree with  the wound assessment as performed by the wound care RN as outlined below:    Consultants:  None  Procedures:  None  Antimicrobials:  Anti-infectives (From admission, onward)    Start     Dose/Rate Route Frequency Ordered Stop   06/01/23 1045  metroNIDAZOLE (FLAGYL) IVPB 500 mg        500 mg 100 mL/hr over 60 Minutes Intravenous Every 12 hours 06/01/23 0954      06/01/23 0500  metroNIDAZOLE (FLAGYL) IVPB 500 mg        500 mg 100 mL/hr over 60 Minutes Intravenous  Once 06/01/23 0454 06/01/23 0654   05/31/23 2330  cefTRIAXone (ROCEPHIN) 2 g in sodium chloride 0.9 % 100 mL IVPB        2 g 200 mL/hr over 30 Minutes Intravenous Every 24 hours 05/31/23 2327 06/05/23 2329   05/31/23 2330  azithromycin (ZITHROMAX) 500 mg in sodium chloride 0.9 % 250 mL IVPB  Status:  Discontinued        500 mg 250 mL/hr over 60 Minutes Intravenous Every 24 hours 05/31/23 2327 06/01/23 0952         Subjective: Patient seen and examined.  She says that her abdominal pain is improving, she denied any nausea but she had not tried any soft diet yet.  No other complaint.  She is eager to go home but she understands that she needs to stay in the hospital.  Objective: Vitals:   06/02/23 0453 06/02/23 0602 06/02/23 0754 06/02/23 1533  BP: 115/74 109/70 101/65 139/87  Pulse: (!) 111 99 84 (!) 106  Resp: 18 18    Temp: 100.1 F (37.8 C) 99 F (37.2 C) 98 F (36.7 C) (!) 101.2 F (38.4 C)  TempSrc: Oral Oral Oral Oral  SpO2: 97% 98% (!) 88% 98%  Weight:      Height:        Intake/Output Summary (Last 24 hours) at 06/02/2023 1544 Last data filed at 06/02/2023 1216 Gross per 24 hour  Intake 612.5 ml  Output --  Net 612.5 ml   Filed Weights   05/31/23 2315  Weight: 73 kg    Examination:  General exam: Appears calm and comfortable  Respiratory system: Clear to auscultation. Respiratory effort normal. Cardiovascular system: S1 & S2 heard, RRR. No JVD, murmurs, rubs, gallops or clicks. No pedal edema. Gastrointestinal system: Abdomen is nondistended, soft and mild right middle abdominal quadrant tenderness.. No organomegaly or masses felt. Normal bowel sounds heard. Central nervous system: Alert and oriented. No focal neurological deficits. Extremities: Symmetric 5 x 5 power. Skin: No rashes, lesions or ulcers Psychiatry: Judgement and insight appear normal. Mood  & affect appropriate.    Data Reviewed: I have personally reviewed following labs and imaging studies  CBC: Recent Labs  Lab 05/31/23 2315 06/01/23 1107 06/02/23 0724  WBC 9.9 11.3* 5.4  NEUTROABS 9.6* 10.7*  --   HGB 9.8* 10.9* 8.9*  HCT 31.8* 34.3* 27.8*  MCV 84.4 86.2 83.2  PLT 344 323 276   Basic Metabolic Panel: Recent Labs  Lab 05/31/23 2315 06/01/23 1107 06/02/23 0724  NA 137 137 131*  K 3.1* 3.4* 2.9*  CL 101 105 100  CO2 22 21* 22  GLUCOSE 141* 101* 88  BUN 6 <5* <5*  CREATININE 0.84 0.77 0.91  CALCIUM 7.9* 7.8* 7.5*   GFR: Estimated Creatinine Clearance: 73.9 mL/min (by C-G formula based on SCr of 0.91 mg/dL). Liver Function Tests: Recent Labs  Lab 05/31/23 2315 06/01/23 1107  06/02/23 0724  AST 18 36 47*  ALT 15 21 29   ALKPHOS 43 50 47  BILITOT 0.7 0.6 0.6  PROT 6.5 6.2* 5.4*  ALBUMIN 2.7* 2.6* 2.2*   No results for input(s): "LIPASE", "AMYLASE" in the last 168 hours. No results for input(s): "AMMONIA" in the last 168 hours. Coagulation Profile: Recent Labs  Lab 05/31/23 2315  INR 1.2   Cardiac Enzymes: No results for input(s): "CKTOTAL", "CKMB", "CKMBINDEX", "TROPONINI" in the last 168 hours. BNP (last 3 results) No results for input(s): "PROBNP" in the last 8760 hours. HbA1C: No results for input(s): "HGBA1C" in the last 72 hours. CBG: No results for input(s): "GLUCAP" in the last 168 hours. Lipid Profile: No results for input(s): "CHOL", "HDL", "LDLCALC", "TRIG", "CHOLHDL", "LDLDIRECT" in the last 72 hours. Thyroid Function Tests: No results for input(s): "TSH", "T4TOTAL", "FREET4", "T3FREE", "THYROIDAB" in the last 72 hours. Anemia Panel: No results for input(s): "VITAMINB12", "FOLATE", "FERRITIN", "TIBC", "IRON", "RETICCTPCT" in the last 72 hours. Sepsis Labs: Recent Labs  Lab 06/01/23 0525 06/01/23 1107 06/01/23 1434 06/02/23 0724  LATICACIDVEN 2.1* 1.8 2.1* 0.7    Recent Results (from the past 240 hour(s))  Blood  culture (routine x 2)     Status: None (Preliminary result)   Collection Time: 05/31/23 11:15 PM   Specimen: BLOOD  Result Value Ref Range Status   Specimen Description BLOOD LEFT ANTECUBITAL  Final   Special Requests   Final    BOTTLES DRAWN AEROBIC AND ANAEROBIC Blood Culture results may not be optimal due to an excessive volume of blood received in culture bottles   Culture   Final    NO GROWTH 1 DAY Performed at [facility], [address], [place], [place] [address]    Report Status PENDING  Incomplete  Resp panel by RT-PCR (RSV, Flu A&B, Covid) Peripheral     Status: None   Collection Time: 05/31/23 11:20 PM   Specimen: Peripheral; Nasal Swab  Result Value Ref Range Status   SARS Coronavirus 2 by RT PCR NEGATIVE NEGATIVE Final   Influenza A by PCR NEGATIVE NEGATIVE Final   Influenza B by PCR NEGATIVE NEGATIVE Final    Comment: (NOTE) The Xpert Xpress SARS-CoV-2/FLU/RSV plus assay is intended as an aid in the diagnosis of influenza from Nasopharyngeal swab specimens and should not be used as a sole basis for treatment. Nasal washings and aspirates are unacceptable for Xpert Xpress SARS-CoV-2/FLU/RSV testing.  Fact Sheet for Patients: BloggerCourse.com  Fact Sheet for Healthcare Providers: SeriousBroker.it  This test is not yet approved or cleared by the [place] FDA and has been authorized for detection and/or diagnosis of SARS-CoV-2 by FDA under an Emergency Use Authorization (EUA). This EUA will remain in effect (meaning this test can be used) for the duration of the COVID-19 declaration under Section 564(b)(1) of the Act, 21 U.S.C. section 360bbb-3(b)(1), unless the authorization is terminated or revoked.     Resp Syncytial Virus by PCR NEGATIVE NEGATIVE Final    Comment: (NOTE) Fact Sheet for Patients: BloggerCourse.com  Fact Sheet for Healthcare  Providers: SeriousBroker.it  This test is not yet approved or cleared by the [place] FDA and has been authorized for detection and/or diagnosis of SARS-CoV-2 by FDA under an Emergency Use Authorization (EUA). This EUA will remain in effect (meaning this test can be used) for the duration of the COVID-19 declaration under Section 564(b)(1) of the Act, 21 U.S.C. section 360bbb-3(b)(1), unless the authorization is terminated or revoked.  Performed  at [facility], [address] [place], [place] [address]   Blood culture (routine x 2)     Status: None (Preliminary result)   Collection Time: 05/31/23 11:30 PM   Specimen: BLOOD RIGHT FOREARM  Result Value Ref Range Status   Specimen Description BLOOD RIGHT FOREARM  Final   Special Requests   Final    BOTTLES DRAWN AEROBIC ONLY Blood Culture results may not be optimal due to an inadequate volume of blood received in culture bottles   Culture   Final    NO GROWTH 1 DAY Performed at [facility], [address] [place], [place] [address]    Report Status PENDING  Incomplete     Radiology Studies: CT ABDOMEN PELVIS W CONTRAST  Result Date: 06/01/2023 CLINICAL DATA:  Sepsis.  Acute abdominal pain EXAM: CT ABDOMEN AND PELVIS WITH CONTRAST TECHNIQUE: Multidetector CT imaging of the abdomen and pelvis was performed using the standard protocol following bolus administration of intravenous contrast. RADIATION DOSE REDUCTION: This exam was performed according to the departmental dose-optimization program which includes automated exposure control, adjustment of the mA and/or kV according to patient size and/or use of iterative reconstruction technique. CONTRAST:  75mL OMNIPAQUE IOHEXOL 350 MG/ML SOLN COMPARISON:  01/17/2009 FINDINGS: Lower chest: Streaky density at the lung bases attributed atelectasis. Hepatobiliary: No focal liver abnormality.No evidence of biliary obstruction or stone. Pancreas:  Unremarkable. Spleen: Unremarkable. Adrenals/Urinary Tract: Negative adrenals. No hydronephrosis or stone. Unremarkable bladder. Stomach/Bowel: Thick walled terminal ileum with wall thickening and enhancement greatest just before the ileocecal valve. More proximally there is low-density submucosal expansion. The regional mesentery is edematous and there is ileocolic mild lymph node enlargement without cavitation. Small volume regional peritoneal fluid Vascular/Lymphatic: No acute vascular abnormality. No mass or adenopathy. Reproductive:Enlarged uterus with multiple myometrial masses, the largest in the right body measuring nearly 9 cm. Dominant follicle in the left ovary. Other: No generalized ascites or pneumoperitoneum. Musculoskeletal: No acute abnormalities. IMPRESSION: 1. Thickening of the terminal ileum which could be inflammatory bowel disease or infectious enteritis. Greater mural enhancement with some indistinctness of wall just before the ileocecal valve, suggest follow-up imaging to exclude mass (not favored) or developing wall breakdown/penetrating disease. 2. Enlarged and fibroid uterus. Electronically Signed   By: Tiburcio Pea M.D.   On: 06/01/2023 04:23   DG Chest Port 1 View  Result Date: 05/31/2023 CLINICAL DATA:  Sinus infection.  Self medicating.  Fever. EXAM: PORTABLE CHEST 1 VIEW COMPARISON:  None Available. FINDINGS: Normal cardiomediastinal silhouette. No focal consolidation, pleural effusion, or pneumothorax. No displaced rib fractures. IMPRESSION: No acute cardiopulmonary disease. Electronically Signed   By: Minerva Fester M.D.   On: 05/31/2023 23:45    Scheduled Meds:  enoxaparin (LOVENOX) injection  40 mg Subcutaneous Q24H   sodium chloride flush  3 mL Intravenous Q12H   Continuous Infusions:  cefTRIAXone (ROCEPHIN)  IV Stopped (06/01/23 2307)   metronidazole Stopped (06/02/23 1133)     LOS: 0 days   Hughie Closs, MD [facility]  06/02/2023, 3:44 PM    *Please note that this is a verbal dictation therefore any spelling or grammatical errors are due to the "Dragon Medical One" system interpretation.  Please page via Amion and do not message via secure chat for urgent patient care matters. Secure chat can be used for non urgent patient care matters.  How to contact the [facility] Attending or Consulting provider 7A - 7P or covering provider during after hours 7P -7A, for this  patient?  Check the care team in [facility] and look for a) attending/consulting [facility] provider listed and b) the [facility] team listed. Page or secure chat 7A-7P. Log into [email or web address] and use [facility] universal password to access. If you do not have the password, please contact the hospital operator. Locate the [facility] provider you are looking for under [facility] and page to a number that you can be directly reached. If you still have difficulty reaching the provider, please page the [facility] (Director on Call) for the Hospitalists listed on amion for assistance.

## 2023-06-03 DIAGNOSIS — A09 Infectious gastroenteritis and colitis, unspecified: Secondary | ICD-10-CM | POA: Diagnosis not present

## 2023-06-03 LAB — PROCALCITONIN: Procalcitonin: 3.21 ng/mL

## 2023-06-03 MED ORDER — OXYMETAZOLINE HCL 0.05 % NA SOLN
1.0000 | Freq: Two times a day (BID) | NASAL | Status: AC
  Administered 2023-06-03 – 2023-06-05 (×2): 1 via NASAL
  Filled 2023-06-03: qty 30

## 2023-06-03 NOTE — Progress Notes (Signed)
PROGRESS NOTE    Virginia Bird  ZOX:[id number] DOB: [birth year] DOA: 05/31/2023 PCP: Doreene Nest, NP   Brief Narrative:  Virginia Bird is a 42 y.o. female with medical history significant of cervical dysplasia, uterine fibroids with dysfunctional uterine bleeding and associated iron deficiency anemia, and GERD.  Patient presented to the hospital with new onset nausea vomiting and loose stools as well as fever at home.  Upon evaluation in the ED her Tmax was 100.8, she was tachycardic, her potassium was 3.1,  lactic acid was elevated at 2.2.  She underwent a CT of the abdomen and pelvis that showed a thickened terminal ileum consistent with either inflammatory bowel disease versus infectious enteritis.   In further discussion with the patient she clarified that she had eaten some [place] food within the previous 24 hours and about 3 to 4 hours after that developed emesis and significant nausea.  She had significant periumbilical abdominal pain and loose stools.  She describes the pain as waxing and waning and associated with abdominal distention.  She reports she was given unknown antibiotics about 2 to 3 weeks ago for similar symptoms.  She has not been referred to her primary gastroenterologist Dr. Allegra Lai after onset of the symptoms.    Assessment & Plan:   Principal Problem:   Infectious enteritis Active Problems:   Iron deficiency anemia   Dysfunctional uterine bleeding   Severe sepsis (HCC)   Hypokalemia  Severe sepsis secondary to small bowel enteritis, POA: Met severe sepsis criteria based on fever 100.8, tachycardia, leukocytosis and lactic acid of> 2.  Lactic acidosis resolved as well as leukocytosis however patient had another fever spike, this time was high-grade of 103 at 2 AM on 06/03/2023.  She denies any abdominal pain but continues to have nausea.  She is afraid and not eating much.  Barely ate 50% of the lunch yesterday and nothing at night.  Leukocytosis resolved.   Will continue current antibiotics.  If she were to spike another fever, will need to repeat CT with contrast to rule out abscess.   Hypokalemia: Resolved.   History of uterine fibroids with dysfunctional uterine bleeding Chronic iron deficiency anemia Hemoglobin stable and at baseline  Hyponatremia: 131.  Monitor.  DVT prophylaxis: enoxaparin (LOVENOX) injection 40 mg Start: 06/01/23 1000   Code Status: Full Code  Family Communication:  None present at bedside.  Plan of care discussed with patient in length and he/she verbalized understanding and agreed with it.  Status is: Inpatient Remains inpatient appropriate because: Febrile    Estimated body mass index is 29.45 kg/m as calculated from the following:   Height as of this encounter: 5\' 2"  (1.575 m).   Weight as of this encounter: 73 kg.    Nutritional Assessment: Body mass index is 29.45 kg/m.Marland Kitchen Seen by dietician.  I agree with the assessment and plan as outlined below: Nutrition Status:        . Skin Assessment: I have examined the patient's skin and I agree with the wound assessment as performed by the wound care RN as outlined below:    Consultants:  None  Procedures:  None  Antimicrobials:  Anti-infectives (From admission, onward)    Start     Dose/Rate Route Frequency Ordered Stop   06/01/23 1045  metroNIDAZOLE (FLAGYL) IVPB 500 mg        500 mg 100 mL/hr over 60 Minutes Intravenous Every 12 hours 06/01/23 0954     06/01/23 0500  metroNIDAZOLE (FLAGYL) IVPB  500 mg        500 mg 100 mL/hr over 60 Minutes Intravenous  Once 06/01/23 0454 06/01/23 0654   05/31/23 2330  cefTRIAXone (ROCEPHIN) 2 g in sodium chloride 0.9 % 100 mL IVPB        2 g 200 mL/hr over 30 Minutes Intravenous Every 24 hours 05/31/23 2327 06/05/23 2329   05/31/23 2330  azithromycin (ZITHROMAX) 500 mg in sodium chloride 0.9 % 250 mL IVPB  Status:  Discontinued        500 mg 250 mL/hr over 60 Minutes Intravenous Every 24 hours 05/31/23  2327 06/01/23 0952         Subjective: Patient seen and examined.  Denies any abdominal pain but continues to have nausea.  Appears down today.  Tends to keep room dark all the time.  Objective: Vitals:   06/02/23 2205 06/03/23 0200 06/03/23 0500 06/03/23 0740  BP: 132/84  (!) 142/91 (!) 146/87  Pulse: 91  92 100  Resp: 18  18   Temp: 99.2 F (37.3 C) (!) 103 F (39.4 C) 99.7 F (37.6 C) 100.1 F (37.8 C)  TempSrc: Oral Oral Oral Oral  SpO2: 96%   97%  Weight:      Height:        Intake/Output Summary (Last 24 hours) at 06/03/2023 1144 Last data filed at 06/02/2023 1216 Gross per 24 hour  Intake 300 ml  Output --  Net 300 ml   Filed Weights   05/31/23 2315  Weight: 73 kg    Examination:  General exam: Appears calm and comfortable  Respiratory system: Clear to auscultation. Respiratory effort normal. Cardiovascular system: S1 & S2 heard, RRR. No JVD, murmurs, rubs, gallops or clicks. No pedal edema. Gastrointestinal system: Abdomen is nondistended, soft and right periumbilical tenderness. No organomegaly or masses felt. Normal bowel sounds heard. Central nervous system: Alert and oriented. No focal neurological deficits. Extremities: Symmetric 5 x 5 power. Skin: No rashes, lesions or ulcers.  Psychiatry: Judgement and insight appear normal. Mood & affect appropriate.   Data Reviewed: I have personally reviewed following labs and imaging studies  CBC: Recent Labs  Lab 05/31/23 2315 06/01/23 1107 06/02/23 0724 06/03/23 0726  WBC 9.9 11.3* 5.4 6.1  NEUTROABS 9.6* 10.7*  --   --   HGB 9.8* 10.9* 8.9* 9.8*  HCT 31.8* 34.3* 27.8* 30.9*  MCV 84.4 86.2 83.2 82.4  PLT 344 323 276 294   Basic Metabolic Panel: Recent Labs  Lab 05/31/23 2315 06/01/23 1107 06/02/23 0724 06/03/23 0726  NA 137 137 131* 131*  K 3.1* 3.4* 2.9* 4.1  CL 101 105 100 100  CO2 22 21* 22 19*  GLUCOSE 141* 101* 88 83  BUN 6 <5* <5* <5*  CREATININE 0.84 0.77 0.91 0.83  CALCIUM 7.9*  7.8* 7.5* 7.9*   GFR: Estimated Creatinine Clearance: 81 mL/min (by C-G formula based on SCr of 0.83 mg/dL). Liver Function Tests: Recent Labs  Lab 05/31/23 2315 06/01/23 1107 06/02/23 0724 06/03/23 0726  AST 18 36 47* 43*  ALT 15 21 29 31   ALKPHOS 43 50 47 84  BILITOT 0.7 0.6 0.6 0.5  PROT 6.5 6.2* 5.4* 5.8*  ALBUMIN 2.7* 2.6* 2.2* 2.3*   No results for input(s): "LIPASE", "AMYLASE" in the last 168 hours. No results for input(s): "AMMONIA" in the last 168 hours. Coagulation Profile: Recent Labs  Lab 05/31/23 2315  INR 1.2   Cardiac Enzymes: No results for input(s): "CKTOTAL", "CKMB", "CKMBINDEX", "TROPONINI" in the  last 168 hours. BNP (last 3 results) No results for input(s): "PROBNP" in the last 8760 hours. HbA1C: No results for input(s): "HGBA1C" in the last 72 hours. CBG: No results for input(s): "GLUCAP" in the last 168 hours. Lipid Profile: No results for input(s): "CHOL", "HDL", "LDLCALC", "TRIG", "CHOLHDL", "LDLDIRECT" in the last 72 hours. Thyroid Function Tests: No results for input(s): "TSH", "T4TOTAL", "FREET4", "T3FREE", "THYROIDAB" in the last 72 hours. Anemia Panel: No results for input(s): "VITAMINB12", "FOLATE", "FERRITIN", "TIBC", "IRON", "RETICCTPCT" in the last 72 hours. Sepsis Labs: Recent Labs  Lab 06/01/23 0525 06/01/23 1107 06/01/23 1434 06/02/23 0724 06/03/23 0810  PROCALCITON  --   --   --   --  3.21  LATICACIDVEN 2.1* 1.8 2.1* 0.7  --     Recent Results (from the past 240 hour(s))  Blood culture (routine x 2)     Status: None (Preliminary result)   Collection Time: 05/31/23 11:15 PM   Specimen: BLOOD  Result Value Ref Range Status   Specimen Description BLOOD LEFT ANTECUBITAL  Final   Special Requests   Final    BOTTLES DRAWN AEROBIC AND ANAEROBIC Blood Culture results may not be optimal due to an excessive volume of blood received in culture bottles   Culture   Final    NO GROWTH 2 DAYS Performed at [facility],  [address] [place], [place] [address]    Report Status PENDING  Incomplete  Resp panel by RT-PCR (RSV, Flu A&B, Covid) Peripheral     Status: None   Collection Time: 05/31/23 11:20 PM   Specimen: Peripheral; Nasal Swab  Result Value Ref Range Status   SARS Coronavirus 2 by RT PCR NEGATIVE NEGATIVE Final   Influenza A by PCR NEGATIVE NEGATIVE Final   Influenza B by PCR NEGATIVE NEGATIVE Final    Comment: (NOTE) The Xpert Xpress SARS-CoV-2/FLU/RSV plus assay is intended as an aid in the diagnosis of influenza from Nasopharyngeal swab specimens and should not be used as a sole basis for treatment. Nasal washings and aspirates are unacceptable for Xpert Xpress SARS-CoV-2/FLU/RSV testing.  Fact Sheet for Patients: BloggerCourse.com  Fact Sheet for Healthcare Providers: SeriousBroker.it  This test is not yet approved or cleared by the [place] FDA and has been authorized for detection and/or diagnosis of SARS-CoV-2 by FDA under an Emergency Use Authorization (EUA). This EUA will remain in effect (meaning this test can be used) for the duration of the COVID-19 declaration under Section 564(b)(1) of the Act, 21 U.S.C. section 360bbb-3(b)(1), unless the authorization is terminated or revoked.     Resp Syncytial Virus by PCR NEGATIVE NEGATIVE Final    Comment: (NOTE) Fact Sheet for Patients: BloggerCourse.com  Fact Sheet for Healthcare Providers: SeriousBroker.it  This test is not yet approved or cleared by the [place] FDA and has been authorized for detection and/or diagnosis of SARS-CoV-2 by FDA under an Emergency Use Authorization (EUA). This EUA will remain in effect (meaning this test can be used) for the duration of the COVID-19 declaration under Section 564(b)(1) of the Act, 21 U.S.C. section 360bbb-3(b)(1), unless the authorization is terminated  or revoked.  Performed at [facility], [address] [place], [place] [address]   Blood culture (routine x 2)     Status: None (Preliminary result)   Collection Time: 05/31/23 11:30 PM   Specimen: BLOOD RIGHT FOREARM  Result Value Ref Range Status   Specimen Description BLOOD RIGHT FOREARM  Final   Special Requests  Final    BOTTLES DRAWN AEROBIC ONLY Blood Culture results may not be optimal due to an inadequate volume of blood received in culture bottles   Culture   Final    NO GROWTH 2 DAYS Performed at [facility], [address] [place], [place] [address]    Report Status PENDING  Incomplete     Radiology Studies: No results found.  Scheduled Meds:  enoxaparin (LOVENOX) injection  40 mg Subcutaneous Q24H   sodium chloride flush  3 mL Intravenous Q12H   Continuous Infusions:  cefTRIAXone (ROCEPHIN)  IV 2 g (06/03/23 0125)   metronidazole 500 mg (06/03/23 0924)     LOS: 1 day   Hughie Closs, MD [facility]  06/03/2023, 11:44 AM   *Please note that this is a verbal dictation therefore any spelling or grammatical errors are due to the "Dragon Medical One" system interpretation.  Please page via Amion and do not message via secure chat for urgent patient care matters. Secure chat can be used for non urgent patient care matters.  How to contact the [facility] Attending or Consulting provider 7A - 7P or covering provider during after hours 7P -7A, for this patient?  Check the care team in [facility] and look for a) attending/consulting TRH provider listed and b) the [facility] team listed. Page or secure chat 7A-7P. Log into [email or web address] and use [place]'s universal password to access. If you do not have the password, please contact the hospital operator. Locate the [facility] provider you are looking for under [facility] and page to a number that you can be directly reached. If you still have difficulty reaching the provider, please page the [facility] (Director on Call)  for the Hospitalists listed on amion for assistance.

## 2023-06-03 NOTE — Plan of Care (Signed)

## 2023-06-04 ENCOUNTER — Inpatient Hospital Stay (HOSPITAL_COMMUNITY): Payer: PRIVATE HEALTH INSURANCE

## 2023-06-04 DIAGNOSIS — A09 Infectious gastroenteritis and colitis, unspecified: Secondary | ICD-10-CM | POA: Diagnosis not present

## 2023-06-04 LAB — BASIC METABOLIC PANEL
Anion gap: 11 (ref 5–15)
BUN: <5 mg/dL — ABNORMAL LOW (ref 6–20)
CO2: 23 mmol/L (ref 22–32)
Calcium: 8.4 mg/dL — ABNORMAL LOW (ref 8.9–10.3)
Chloride: 97 mmol/L — ABNORMAL LOW (ref 98–111)
Creatinine, Ser: 0.72 mg/dL (ref 0.44–1.00)
GFR, Estimated: >60 mL/min (ref >60)
Glucose, Bld: 106 mg/dL — ABNORMAL HIGH (ref 70–99)
Potassium: 3.2 mmol/L — ABNORMAL LOW (ref 3.5–5.1)
Sodium: 131 mmol/L — ABNORMAL LOW (ref 135–145)

## 2023-06-04 LAB — CBC WITH DIFFERENTIAL/PLATELET
Abs Immature Granulocytes: 0.09 10*3/uL — ABNORMAL HIGH (ref 0.00–0.07)
Basophils Absolute: 0 10*3/uL (ref 0.0–0.1)
Basophils Relative: 1 %
Eosinophils Absolute: 0 10*3/uL (ref 0.0–0.5)
Eosinophils Relative: 0 %
HCT: 33.1 % — ABNORMAL LOW (ref 36.0–46.0)
Hemoglobin: 10.6 g/dL — ABNORMAL LOW (ref 12.0–15.0)
Immature Granulocytes: 2 %
Lymphocytes Relative: 14 %
Lymphs Abs: 0.8 10*3/uL (ref 0.7–4.0)
MCH: 25.9 pg — ABNORMAL LOW (ref 26.0–34.0)
MCHC: 32 g/dL (ref 30.0–36.0)
MCV: 80.9 fL (ref 80.0–100.0)
Monocytes Absolute: 0.5 10*3/uL (ref 0.1–1.0)
Monocytes Relative: 10 %
Neutro Abs: 4.2 10*3/uL (ref 1.7–7.7)
Neutrophils Relative %: 73 %
Platelets: 320 10*3/uL (ref 150–400)
RBC: 4.09 MIL/uL (ref 3.87–5.11)
RDW: 14 % (ref 11.5–15.5)
WBC: 5.6 10*3/uL (ref 4.0–10.5)
nRBC: 0 % (ref 0.0–0.2)

## 2023-06-04 MED ORDER — IOHEXOL 350 MG/ML SOLN
75.0000 mL | Freq: Once | INTRAVENOUS | Status: AC | PRN
  Administered 2023-06-04: 75 mL via INTRAVENOUS

## 2023-06-04 MED ORDER — IOHEXOL 9 MG/ML PO SOLN
500.0000 mL | ORAL | Status: AC
  Administered 2023-06-04 (×2): 500 mL via ORAL

## 2023-06-04 MED ORDER — IBUPROFEN 200 MG PO TABS
600.0000 mg | ORAL_TABLET | Freq: Two times a day (BID) | ORAL | Status: DC | PRN
  Administered 2023-06-04 – 2023-06-07 (×5): 600 mg via ORAL
  Filled 2023-06-04 (×5): qty 3

## 2023-06-04 NOTE — Progress Notes (Signed)
PROGRESS NOTE    Virginia Bird  AVW:[id number] DOB: Jan 31, 1981 DOA: 05/31/2023 PCP: Doreene Nest, NP   Brief Narrative:  Virginia Bird is a 42 y.o. female with medical history significant of cervical dysplasia, uterine fibroids with dysfunctional uterine bleeding and associated iron deficiency anemia, and GERD.  Patient presented to the hospital with new onset nausea vomiting and loose stools as well as fever at home.  Upon evaluation in the ED her Tmax was 100.8, she was tachycardic, her potassium was 3.1,  lactic acid was elevated at 2.2.  She underwent a CT of the abdomen and pelvis that showed a thickened terminal ileum consistent with either inflammatory bowel disease versus infectious enteritis.   In further discussion with the patient she clarified that she had eaten some [place] food within the previous 24 hours and about 3 to 4 hours after that developed emesis and significant nausea.  She had significant periumbilical abdominal pain and loose stools.  She describes the pain as waxing and waning and associated with abdominal distention.  She reports she was given unknown antibiotics about 2 to 3 weeks ago for similar symptoms.  She has not been referred to her primary gastroenterologist Dr. Allegra Lai after onset of the symptoms.    Assessment & Plan:   Principal Problem:   Infectious enteritis Active Problems:   Iron deficiency anemia   Dysfunctional uterine bleeding   Severe sepsis (HCC)   Hypokalemia  Severe sepsis secondary to small bowel enteritis, POA: Met severe sepsis criteria based on fever 100.8, tachycardia, leukocytosis and lactic acid of> 2.  Lactic acidosis resolved as well as leukocytosis however patient had another fever spike, this time was high-grade of 103 at 2 AM on 06/03/2023.  She denies any abdominal pain but continues to have intermittent nausea.  She ate 100% of lunch yesterday but nothing for dinner.  No vomiting.  Will continue soft diet.  She  continues to have fever with recent temperature spike of 100.7 this morning at 7:22 AM.  No abdominal pain or tenderness.  I am concerned about possible abscess.  Will proceed with repeat CT abdomen and pelvis with IV contrast.  Low threshold for GI consultation.  Procalcitonin 3.21.   Hypokalemia: Resolved.   History of uterine fibroids with dysfunctional uterine bleeding Chronic iron deficiency anemia Hemoglobin stable and at baseline  Hyponatremia: 131.  Monitor.  DVT prophylaxis: enoxaparin (LOVENOX) injection 40 mg Start: 06/01/23 1000   Code Status: Full Code  Family Communication:  None present at bedside.  Plan of care discussed with patient in length and he/she verbalized understanding and agreed with it.  Status is: Inpatient Remains inpatient appropriate because: Febrile    Estimated body mass index is 29.45 kg/m as calculated from the following:   Height as of this encounter: 5\' 2"  (1.575 m).   Weight as of this encounter: 73 kg.    Nutritional Assessment: Body mass index is 29.45 kg/m.Marland Kitchen Seen by dietician.  I agree with the assessment and plan as outlined below: Nutrition Status:        . Skin Assessment: I have examined the patient's skin and I agree with the wound assessment as performed by the wound care RN as outlined below:    Consultants:  None  Procedures:  None  Antimicrobials:  Anti-infectives (From admission, onward)    Start     Dose/Rate Route Frequency Ordered Stop   06/01/23 1045  metroNIDAZOLE (FLAGYL) IVPB 500 mg  500 mg 100 mL/hr over 60 Minutes Intravenous Every 12 hours 06/01/23 0954     06/01/23 0500  metroNIDAZOLE (FLAGYL) IVPB 500 mg        500 mg 100 mL/hr over 60 Minutes Intravenous  Once 06/01/23 0454 06/01/23 0654   05/31/23 2330  cefTRIAXone (ROCEPHIN) 2 g in sodium chloride 0.9 % 100 mL IVPB        2 g 200 mL/hr over 30 Minutes Intravenous Every 24 hours 05/31/23 2327 06/05/23 2329   05/31/23 2330  azithromycin  (ZITHROMAX) 500 mg in sodium chloride 0.9 % 250 mL IVPB  Status:  Discontinued        500 mg 250 mL/hr over 60 Minutes Intravenous Every 24 hours 05/31/23 2327 06/01/23 0952         Subjective: Patient seen and examined.  Her major complaint is " I have sinusitis and I am tired of headaches".  Her only complaint is headache.  She continues to believe that this is sinusitis.  Yesterday she requested to give her Augmentin.  She was informed that she is on Rocephin and that should cover sinusitis if any however I personally do not believe she has any sinusitis.  For headaches, she is aware that she has Tylenol ordered.  I am going to also order ibuprofen as needed every 12 hours.  Objective: Vitals:   06/03/23 1439 06/03/23 2019 06/04/23 0430 06/04/23 0722  BP: 119/70 128/74 136/86 (!) 145/82  Pulse: 85 97 78 96  Resp:  16 16 18   Temp: 100.1 F (37.8 C) 100 F (37.8 C) 99 F (37.2 C) (!) 100.7 F (38.2 C)  TempSrc: Oral Oral Oral Oral  SpO2: 96% 95% 99% 99%  Weight:      Height:        Intake/Output Summary (Last 24 hours) at 06/04/2023 0854 Last data filed at 06/04/2023 0749 Gross per 24 hour  Intake 500 ml  Output --  Net 500 ml   Filed Weights   05/31/23 2315  Weight: 73 kg    Examination:  General exam: Appears calm and comfortable  Respiratory system: Clear to auscultation. Respiratory effort normal. Cardiovascular system: S1 & S2 heard, RRR. No JVD, murmurs, rubs, gallops or clicks. No pedal edema. Gastrointestinal system: Abdomen is nondistended, soft and nontender. No organomegaly or masses felt. Normal bowel sounds heard. Central nervous system: Alert and oriented. No focal neurological deficits. Extremities: Symmetric 5 x 5 power. Skin: No rashes, lesions or ulcers.  Psychiatry: Judgement and insight appear normal. Mood & affect appropriate.   Data Reviewed: I have personally reviewed following labs and imaging studies  CBC: Recent Labs  Lab 05/31/23 2315  06/01/23 1107 06/02/23 0724 06/03/23 0726  WBC 9.9 11.3* 5.4 6.1  NEUTROABS 9.6* 10.7*  --   --   HGB 9.8* 10.9* 8.9* 9.8*  HCT 31.8* 34.3* 27.8* 30.9*  MCV 84.4 86.2 83.2 82.4  PLT 344 323 276 294   Basic Metabolic Panel: Recent Labs  Lab 05/31/23 2315 06/01/23 1107 06/02/23 0724 06/03/23 0726  NA 137 137 131* 131*  K 3.1* 3.4* 2.9* 4.1  CL 101 105 100 100  CO2 22 21* 22 19*  GLUCOSE 141* 101* 88 83  BUN 6 <5* <5* <5*  CREATININE 0.84 0.77 0.91 0.83  CALCIUM 7.9* 7.8* 7.5* 7.9*   GFR: Estimated Creatinine Clearance: 81 mL/min (by C-G formula based on SCr of 0.83 mg/dL). Liver Function Tests: Recent Labs  Lab 05/31/23 2315 06/01/23 1107 06/02/23 0724 06/03/23  0726  AST 18 36 47* 43*  ALT 15 21 29 31   ALKPHOS 43 50 47 84  BILITOT 0.7 0.6 0.6 0.5  PROT 6.5 6.2* 5.4* 5.8*  ALBUMIN 2.7* 2.6* 2.2* 2.3*   No results for input(s): "LIPASE", "AMYLASE" in the last 168 hours. No results for input(s): "AMMONIA" in the last 168 hours. Coagulation Profile: Recent Labs  Lab 05/31/23 2315  INR 1.2   Cardiac Enzymes: No results for input(s): "CKTOTAL", "CKMB", "CKMBINDEX", "TROPONINI" in the last 168 hours. BNP (last 3 results) No results for input(s): "PROBNP" in the last 8760 hours. HbA1C: No results for input(s): "HGBA1C" in the last 72 hours. CBG: No results for input(s): "GLUCAP" in the last 168 hours. Lipid Profile: No results for input(s): "CHOL", "HDL", "LDLCALC", "TRIG", "CHOLHDL", "LDLDIRECT" in the last 72 hours. Thyroid Function Tests: No results for input(s): "TSH", "T4TOTAL", "FREET4", "T3FREE", "THYROIDAB" in the last 72 hours. Anemia Panel: No results for input(s): "VITAMINB12", "FOLATE", "FERRITIN", "TIBC", "IRON", "RETICCTPCT" in the last 72 hours. Sepsis Labs: Recent Labs  Lab 06/01/23 0525 06/01/23 1107 06/01/23 1434 06/02/23 0724 06/03/23 0810  PROCALCITON  --   --   --   --  3.21  LATICACIDVEN 2.1* 1.8 2.1* 0.7  --     Recent Results  (from the past 240 hour(s))  Blood culture (routine x 2)     Status: None (Preliminary result)   Collection Time: 05/31/23 11:15 PM   Specimen: BLOOD  Result Value Ref Range Status   Specimen Description BLOOD LEFT ANTECUBITAL  Final   Special Requests   Final    BOTTLES DRAWN AEROBIC AND ANAEROBIC Blood Culture results may not be optimal due to an excessive volume of blood received in culture bottles   Culture   Final    NO GROWTH 3 DAYS Performed at [facility], [address], [place], [place] [address]    Report Status PENDING  Incomplete  Resp panel by RT-PCR (RSV, Flu A&B, Covid) Peripheral     Status: None   Collection Time: 05/31/23 11:20 PM   Specimen: Peripheral; Nasal Swab  Result Value Ref Range Status   SARS Coronavirus 2 by RT PCR NEGATIVE NEGATIVE Final   Influenza A by PCR NEGATIVE NEGATIVE Final   Influenza B by PCR NEGATIVE NEGATIVE Final    Comment: (NOTE) The Xpert Xpress SARS-CoV-2/FLU/RSV plus assay is intended as an aid in the diagnosis of influenza from Nasopharyngeal swab specimens and should not be used as a sole basis for treatment. Nasal washings and aspirates are unacceptable for Xpert Xpress SARS-CoV-2/FLU/RSV testing.  Fact Sheet for Patients: BloggerCourse.com  Fact Sheet for Healthcare Providers: SeriousBroker.it  This test is not yet approved or cleared by the [place] FDA and has been authorized for detection and/or diagnosis of SARS-CoV-2 by FDA under an Emergency Use Authorization (EUA). This EUA will remain in effect (meaning this test can be used) for the duration of the COVID-19 declaration under Section 564(b)(1) of the Act, 21 U.S.C. section 360bbb-3(b)(1), unless the authorization is terminated or revoked.     Resp Syncytial Virus by PCR NEGATIVE NEGATIVE Final    Comment: (NOTE) Fact Sheet for Patients: BloggerCourse.com  Fact Sheet for  Healthcare Providers: SeriousBroker.it  This test is not yet approved or cleared by the [place] FDA and has been authorized for detection and/or diagnosis of SARS-CoV-2 by FDA under an Emergency Use Authorization (EUA). This EUA will remain in effect (meaning this test can be used) for  the duration of the COVID-19 declaration under Section 564(b)(1) of the Act, 21 U.S.C. section 360bbb-3(b)(1), unless the authorization is terminated or revoked.  Performed at [facility], [address] [place], [place] [address]   Blood culture (routine x 2)     Status: None (Preliminary result)   Collection Time: 05/31/23 11:30 PM   Specimen: BLOOD RIGHT FOREARM  Result Value Ref Range Status   Specimen Description BLOOD RIGHT FOREARM  Final   Special Requests   Final    BOTTLES DRAWN AEROBIC ONLY Blood Culture results may not be optimal due to an inadequate volume of blood received in culture bottles   Culture   Final    NO GROWTH 3 DAYS Performed at [facility], [address] [place], [place] [address]    Report Status PENDING  Incomplete     Radiology Studies: No results found.  Scheduled Meds:  enoxaparin (LOVENOX) injection  40 mg Subcutaneous Q24H   iohexol  500 mL Oral Q1H   oxymetazoline  1 spray Each Nare BID   sodium chloride flush  3 mL Intravenous Q12H   Continuous Infusions:  cefTRIAXone (ROCEPHIN)  IV Stopped (06/04/23 0004)   metronidazole Stopped (06/03/23 2227)     LOS: 2 days   Hughie Closs, MD [facility]  06/04/2023, 8:54 AM   *Please note that this is a verbal dictation therefore any spelling or grammatical errors are due to the "Dragon Medical One" system interpretation.  Please page via Amion and do not message via secure chat for urgent patient care matters. Secure chat can be used for non urgent patient care matters.  How to contact the [facility] Attending or Consulting provider 7A - 7P or covering provider  during after hours 7P -7A, for this patient?  Check the care team in [facility] and look for a) attending/consulting TRH provider listed and b) the [facility] team listed. Page or secure chat 7A-7P. Log into [email or web address] and use [place]'s universal password to access. If you do not have the password, please contact the hospital operator. Locate the [facility] provider you are looking for under [facility] and page to a number that you can be directly reached. If you still have difficulty reaching the provider, please page the [facility] (Director on Call) for the Hospitalists listed on amion for assistance.

## 2023-06-05 DIAGNOSIS — A09 Infectious gastroenteritis and colitis, unspecified: Secondary | ICD-10-CM | POA: Diagnosis not present

## 2023-06-05 LAB — COMPREHENSIVE METABOLIC PANEL
ALT: 35 U/L (ref 0–44)
AST: 64 U/L — ABNORMAL HIGH (ref 15–41)
Albumin: 2.2 g/dL — ABNORMAL LOW (ref 3.5–5.0)
Alkaline Phosphatase: 105 U/L (ref 38–126)
Anion gap: 9 (ref 5–15)
BUN: <5 mg/dL — ABNORMAL LOW (ref 6–20)
CO2: 23 mmol/L (ref 22–32)
Calcium: 7.9 mg/dL — ABNORMAL LOW (ref 8.9–10.3)
Chloride: 100 mmol/L (ref 98–111)
Creatinine, Ser: 0.68 mg/dL (ref 0.44–1.00)
GFR, Estimated: >60 mL/min (ref >60)
Glucose, Bld: 95 mg/dL (ref 70–99)
Potassium: 3.2 mmol/L — ABNORMAL LOW (ref 3.5–5.1)
Sodium: 132 mmol/L — ABNORMAL LOW (ref 135–145)
Total Bilirubin: 0.6 mg/dL (ref 0.3–1.2)
Total Protein: 5.7 g/dL — ABNORMAL LOW (ref 6.5–8.1)

## 2023-06-05 LAB — CBC WITH DIFFERENTIAL/PLATELET
Abs Immature Granulocytes: 0.13 10*3/uL — ABNORMAL HIGH (ref 0.00–0.07)
Basophils Absolute: 0 10*3/uL (ref 0.0–0.1)
Basophils Relative: 1 %
Eosinophils Absolute: 0 10*3/uL (ref 0.0–0.5)
Eosinophils Relative: 0 %
HCT: 28.5 % — ABNORMAL LOW (ref 36.0–46.0)
Hemoglobin: 9.4 g/dL — ABNORMAL LOW (ref 12.0–15.0)
Immature Granulocytes: 2 %
Lymphocytes Relative: 16 %
Lymphs Abs: 1 10*3/uL (ref 0.7–4.0)
MCH: 26.9 pg (ref 26.0–34.0)
MCHC: 33 g/dL (ref 30.0–36.0)
MCV: 81.4 fL (ref 80.0–100.0)
Monocytes Absolute: 0.8 10*3/uL (ref 0.1–1.0)
Monocytes Relative: 14 %
Neutro Abs: 4 10*3/uL (ref 1.7–7.7)
Neutrophils Relative %: 67 %
Platelets: 279 10*3/uL (ref 150–400)
RBC: 3.5 MIL/uL — ABNORMAL LOW (ref 3.87–5.11)
RDW: 14.1 % (ref 11.5–15.5)
WBC: 6 10*3/uL (ref 4.0–10.5)
nRBC: 0.3 % — ABNORMAL HIGH (ref 0.0–0.2)

## 2023-06-05 LAB — C DIFFICILE (CDIFF) QUICK SCRN (NO PCR REFLEX)
C Diff antigen: NEGATIVE
C Diff interpretation: NOT DETECTED
C Diff toxin: NEGATIVE

## 2023-06-05 MED ORDER — PIPERACILLIN-TAZOBACTAM 3.375 G IVPB
3.3750 g | Freq: Three times a day (TID) | INTRAVENOUS | Status: DC
  Administered 2023-06-05 – 2023-06-08 (×9): 3.375 g via INTRAVENOUS
  Filled 2023-06-05 (×9): qty 50

## 2023-06-05 MED ORDER — POTASSIUM CHLORIDE CRYS ER 20 MEQ PO TBCR
40.0000 meq | EXTENDED_RELEASE_TABLET | ORAL | Status: AC
  Administered 2023-06-05 (×2): 40 meq via ORAL
  Filled 2023-06-05 (×2): qty 2

## 2023-06-05 NOTE — Consult Note (Signed)
Consultation  Referring Provider: [facility]/ Pahwani Primary Care Physician:  Doreene Nest, NP Primary Gastroenterologist:Dr Christeen Douglas [facility] 2022 Reason for Consultation: Acute ileitis  HPI: Virginia Bird is a 42 y.o. female history of chronic iron deficiency anemia, felt to be on the basis of menorrhagia, with large fibroids.  Prior GI evaluation with Dr. Christeen Douglas GI in 2022.  She did have a previous anal fissure and had also undergone hemorrhoidal banding in November 2022.  No prior colonoscopy. Is that she had been in her usual state of health though had had some recent bacterial vaginosis and treatment with antibiotics. She says she had not been having any chronic abdominal pain or cramping, no changes in bowel habits, no issues over the past few months with diarrhea, no hematochezia. She ate [place] food on 05/30/2023/beef steak and onions, and became ill 3 to 4 hours later with lower abdominal pain cramping nausea and vomiting and fever.  She was also having sinus issues and felt like she had a sinus infection. When she presented she met sepsis criteria with tachycardia, elevated lactate at 2.2 and elevated WBC in the 12 range. CT of the abdomen and pelvis was done which showed come for antral wall thickening of the terminal ileum and a large lobulated fibroid uterus with the largest fibroid 7.8 cm, Chest x-ray negative  Can labs with WBC 11.2/hemoglobin 10.9/Mehta crit 14.3/platelets 329 Potassium 3.1 Creatinine 0.84/LFTs within normal limits  Cultures were done and she was empirically started on Rocephin and metronidazole  She says that she feels better since admission and that her nausea and vomiting has resolved and she is able to eat, she is not having any significant abdominal pain at this point but has been having loose stools with at least 3-4 loose to liquid bowel movements per day, has not noticed any blood. Unfortunately she continues to have intermittent  fevers and was febrile to 102 this morning  Pete CT of the abdomen pelvis yesterday showed persistent circumferential wall thickening of the terminal ileum.  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 Authorizing Provider  EPINEPHrine 0.3 mg/0.3 mL IJ SOAJ injection Inject 0.3 mg into the muscle as needed for anaphylaxis. 04/01/23  Yes Doreene Nest, NP  famotidine (PEPCID) 20 MG tablet TAKE 1 TABLET (20 MG TOTAL) BY MOUTH DAILY. FOR HEARTBURN. 04/08/23  Yes Doreene Nest, NP  fluticasone (FLONASE) 50 MCG/ACT nasal spray Place 1 spray into both nostrils 2 (two) times daily as needed for allergies or rhinitis. 01/15/23  Yes Doreene Nest, NP  naproxen (NAPROSYN) 500  MG tablet Take 1 tablet (500 mg total) by mouth 2 (two) times daily with a meal. As needed for cramping Patient taking differently: Take 500 mg by mouth daily as needed (for cramping). 01/15/23  Yes Doreene Nest, NP  amoxicillin-clavulanate (AUGMENTIN) 875-125 MG tablet Take 1 tablet by mouth 2 (two) times daily for 7 days. Patient not taking: Reported on 06/01/2023  05/31/23 06/07/23  Claiborne Rigg, NP  ferrous gluconate (FERGON) 324 MG tablet Take 1 tablet (324 mg total) by mouth daily with breakfast. Patient not taking: Reported on 06/01/2023 03/12/22   Tereso Newcomer, MD    Current Facility-Administered Medications  Medication Dose Route Frequency Provider Last Rate Last Admin   acetaminophen (TYLENOL) tablet 650 mg  650 mg Oral Q6H PRN Russella Dar, NP   650 mg at 06/05/23 9629   Or   acetaminophen (TYLENOL) suppository 650 mg  650 mg Rectal Q6H PRN Russella Dar, NP       enoxaparin (LOVENOX) injection 40 mg  40 mg Subcutaneous Q24H Russella Dar, NP   40 mg at 06/04/23 1033   HYDROmorphone (DILAUDID) injection 0.5 mg  0.5 mg Intravenous Q2H PRN Russella Dar, NP   0.5 mg at 06/02/23 0007   ibuprofen (ADVIL) tablet 600 mg  600 mg Oral Q12H PRN Hughie Closs, MD   600 mg at 06/05/23 0323   ketorolac (TORADOL) 30 MG/ML injection 30 mg  30 mg Intravenous Q6H PRN Hughie Closs, MD   30 mg at 06/03/23 1334   metroNIDAZOLE (FLAGYL) IVPB 500 mg  500 mg Intravenous Q12H Russella Dar, NP 100 mL/hr at 06/04/23 2227 500 mg at 06/04/23 2227   ondansetron (ZOFRAN) tablet 4 mg  4 mg Oral Q6H PRN Russella Dar, NP   4 mg at 06/04/23 1548   Or   ondansetron (ZOFRAN) injection 4 mg  4 mg Intravenous Q6H PRN Russella Dar, NP   4 mg at 06/05/23 0322   oxymetazoline (AFRIN) 0.05 % nasal spray 1 spray  1 spray Each Nare BID Hughie Closs, MD   1 spray at 06/03/23 2121   potassium chloride SA (KLOR-CON M) CR tablet 40 mEq  40 mEq Oral Q4H Pahwani, Ravi, MD       sodium chloride flush (NS) 0.9 % injection 3 mL  3 mL Intravenous Q12H Russella Dar, NP   3 mL at 06/04/23 2224    Allergies as of 05/31/2023   (No Known Allergies)    Family History  Problem Relation Age of Onset   Thyroid disease Mother    Hypertension Mother    Cancer Father        leukemia   Stroke Father    Cancer Maternal Aunt        Breast Cancer   Breast cancer  Maternal Aunt    Asthma Sister    Miscarriages / [place] Sister     Social History   Socioeconomic History   Marital status: Single    Spouse name: Not on file   Number of children: 1   Years of education: Not on file   Highest education level: Not on file  Occupational History   Not on file  Tobacco Use   Smoking status: Every Day    Current packs/day: 0.25    Average packs/day: 0.3 packs/day for 10.0 years (2.5 ttl pk-yrs)    Types: Cigarettes   Smokeless tobacco: Never   Tobacco comments:    Has not  tried any quit aids/tn  Vaping Use   Vaping status: Never Used  Substance and Sexual Activity   Alcohol use: Yes    Comment: daily cocktail   Drug use: No   Sexual activity: Yes    Partners: Male  Other Topics Concern   Not on file  Social History Narrative   Single.    1 daughter.    Works at [employer].   Enjoys going to [employer].    Social Determinants of Health   Financial Resource Strain: Not on file  Food Insecurity: No Food Insecurity (06/01/2023)   Hunger Vital Sign    Worried About Running Out of Food in the Last Year: Never true    Ran Out of Food in the Last Year: Never true  Transportation Needs: No Transportation Needs (06/01/2023)   PRAPARE - Administrator, Civil Service (Medical): No    Lack of Transportation (Non-Medical): No  Physical Activity: Not on file  Stress: Not on file  Social Connections: Not on file  Intimate Partner Violence: Not At Risk (06/01/2023)   Humiliation, Afraid, Rape, and Kick questionnaire    Fear of Current or Ex-Partner: No    Emotionally Abused: No    Physically Abused: No    Sexually Abused: No    Review of Systems: Pertinent positive and negative review of systems were noted in the above HPI section.  All other review of systems was otherwise negative.   Physical Exam: Vital signs in last 24 hours: Temp:  [98 F (36.7 C)-102 F (38.9 C)] 98 F (36.7 C) (09/20 0729) Pulse Rate:  [69-93]  69 (09/20 0729) Resp:  [16-18] 18 (09/20 0729) BP: (92-137)/(63-79) 121/79 (09/20 0729) SpO2:  [89 %-100 %] 100 % (09/20 0729) Last BM Date : 06/03/23 General:   Alert,  Well-developed, well-nourished, pleasant and cooperative in NAD Head:  Normocephalic and atraumatic. Eyes:  Sclera clear, no icterus.   Conjunctiva pink. Ears:  Normal auditory acuity. Nose:  No deformity, discharge,  or lesions. Mouth:  No deformity or lesions.   Neck:  Supple; no masses or thyromegaly. Lungs:  Clear throughout to auscultation.   No wheezes, crackles, or rhonchi  Heart:  Regular rate and rhythm; no murmurs, clicks, rubs,  or gallops. Abdomen:  Soft, minimal tenderness right lower quadrant and suprapubic, full bladder, BS active,nonpalp mass or hsm.   Rectal: not done Msk:  Symmetrical without gross deformities. . Pulses:  Normal pulses noted. Extremities:  Without clubbing or edema. Neurologic:  Alert and  oriented x4;  grossly normal neurologically. Skin:  Intact without significant lesions or rashes.. Psych:  Alert and cooperative. Normal mood and affect.  Intake/Output from previous day: 09/19 0701 - 09/20 0700 In: 400 [IV Piggyback:400] Out: -  Intake/Output this shift: No intake/output data recorded.  Lab Results: Recent Labs    06/03/23 0726 06/04/23 1329 06/05/23 0535  WBC 6.1 5.6 6.0  HGB 9.8* 10.6* 9.4*  HCT 30.9* 33.1* 28.5*  PLT 294 320 279   BMET Recent Labs    06/03/23 0726 06/04/23 1329 06/05/23 0535  NA 131* 131* 132*  K 4.1 3.2* 3.2*  CL 100 97* 100  CO2 19* 23 23  GLUCOSE 83 106* 95  BUN <5* <5* <5*  CREATININE 0.83 0.72 0.68  CALCIUM 7.9* 8.4* 7.9*   LFT Recent Labs    06/05/23 0535  PROT 5.7*  ALBUMIN 2.2*  AST 64*  ALT 35  ALKPHOS 105  BILITOT 0.6  PT/INR No results for input(s): "LABPROT", "INR" in the last 72 hours. Hepatitis Panel No results for input(s): "HEPBSAG", "HCVAB", "HEPAIGM", "HEPBIGM" in the last 72 hours.    IMPRESSION:   #80 43 year old African-American female with acute illness onset 05/30/2023 after eating [place] food with development of nausea vomiting and crampy abdominal pain as well as fever. Sent to the ER and met sepsis criteria Empiric Rocephin and metronidazole  Initial CT imaging showed circumferential wall thickening of the terminal ileum and a large fibroid uterus  Blood cultures negative  She has had improvement in some of her GI symptoms with resolution of nausea and vomiting, she has been able to eat, no significant abdominal pain but continues to have intermittent fevers and has been having loose stools at least 3-4 times per day  This is still most consistent with an acute infectious enteritis likely foodborne/food poisoning  No antecedent symptoms to suggest IBD  #2 history of chronic iron deficiency anemia felt secondary to menorrhagia with fibroid uterus, followed by hematology  History of internal hemorrhoids status post banding 2002 and prior anal fissure  Plan; diet as tolerates GI path panel, cdiff quick Calprotectin Further GI recommendations pending results of infectious workup    Korynn Kenedy PA-C 06/05/2023, 10:10 AM

## 2023-06-05 NOTE — Progress Notes (Signed)
PROGRESS NOTE    Virginia Bird  NUU:[id number] DOB: December 06, 1980 DOA: 05/31/2023 PCP: Doreene Nest, NP   Brief Narrative:  Virginia Bird is a 42 y.o. female with medical history significant of cervical dysplasia, uterine fibroids with dysfunctional uterine bleeding and associated iron deficiency anemia, and GERD.  Patient presented to the hospital with new onset nausea vomiting and loose stools as well as fever at home.  Upon evaluation in the ED her Tmax was 100.8, she was tachycardic, her potassium was 3.1,  lactic acid was elevated at 2.2.  She underwent a CT of the abdomen and pelvis that showed a thickened terminal ileum consistent with either inflammatory bowel disease versus infectious enteritis.   In further discussion with the patient she clarified that she had eaten some [place] food within the previous 24 hours and about 3 to 4 hours after that developed emesis and significant nausea.  She had significant periumbilical abdominal pain and loose stools.  She describes the pain as waxing and waning and associated with abdominal distention.  She reports she was given unknown antibiotics about 2 to 3 weeks ago for similar symptoms.  She has not been referred to her primary gastroenterologist Dr. Allegra Lai after onset of the symptoms.    Assessment & Plan:   Principal Problem:   Infectious enteritis Active Problems:   Iron deficiency anemia   Dysfunctional uterine bleeding   Severe sepsis (HCC)   Hypokalemia  Severe sepsis secondary to small bowel enteritis, POA: Met severe sepsis criteria based on fever 100.8, tachycardia, leukocytosis and lactic acid of> 2.  Lactic acidosis resolved as well as leukocytosis however patient had another fever spike, this time was high-grade of 103 at 2 AM on 06/03/2023.  She denies any abdominal pain but continues to have intermittent nausea.  She ate 100% of lunch yesterday but nothing for dinner.  No vomiting.  Will continue soft diet.  She  continues to have fever with recent temperature spike of 100.7 this morning at 7:22 AM.  No abdominal pain or tenderness.  Out of concern of possible abscess, I repeated CT abdomen pelvis which once again showed ileitis but no abscess.  She continues to have persistent fever with Tmax of 102 yesterday afternoon despite of being on Rocephin and Flagyl.  She completed 5 days of Rocephin.  I will now start her on Zosyn.  I have consulted GI for their recommendations.  Procalcitonin 3.21.   Hypokalemia: Low again, will replenish.   History of uterine fibroids with dysfunctional uterine bleeding Chronic iron deficiency anemia Hemoglobin stable and at baseline  Hyponatremia: 132.  Monitor.  Isolated elevation of AST: Slightly higher than before.  No abdominal pain or tenderness.  Awaiting GI recommendations.  HCAP: CT abdomen shows new patchy airspace and groundglass opacities in the bilateral lower lobes and right middle lobe concerning for multifocal pneumonia however patient denies any shortness of breath, she has no leukocytosis.  Clinically does not appear to have pneumonia however she is now being started on Zosyn so that should cover it if it is pneumonia.  DVT prophylaxis: enoxaparin (LOVENOX) injection 40 mg Start: 06/01/23 1000   Code Status: Full Code  Family Communication:  None present at bedside.  Plan of care discussed with patient in length and he/she verbalized understanding and agreed with it.  Status is: Inpatient Remains inpatient appropriate because: Febrile    Estimated body mass index is 29.45 kg/m as calculated from the following:   Height as of this  encounter: 5\' 2"  (1.575 m).   Weight as of this encounter: 73 kg.    Nutritional Assessment: Body mass index is 29.45 kg/m.Marland Kitchen Seen by dietician.  I agree with the assessment and plan as outlined below: Nutrition Status:        . Skin Assessment: I have examined the patient's skin and I agree with the wound  assessment as performed by the wound care RN as outlined below:    Consultants:  None  Procedures:  None  Antimicrobials:  Anti-infectives (From admission, onward)    Start     Dose/Rate Route Frequency Ordered Stop   06/01/23 1045  metroNIDAZOLE (FLAGYL) IVPB 500 mg        500 mg 100 mL/hr over 60 Minutes Intravenous Every 12 hours 06/01/23 0954     06/01/23 0500  metroNIDAZOLE (FLAGYL) IVPB 500 mg        500 mg 100 mL/hr over 60 Minutes Intravenous  Once 06/01/23 0454 06/01/23 0654   05/31/23 2330  cefTRIAXone (ROCEPHIN) 2 g in sodium chloride 0.9 % 100 mL IVPB        2 g 200 mL/hr over 30 Minutes Intravenous Every 24 hours 05/31/23 2327 06/05/23 0001   05/31/23 2330  azithromycin (ZITHROMAX) 500 mg in sodium chloride 0.9 % 250 mL IVPB  Status:  Discontinued        500 mg 250 mL/hr over 60 Minutes Intravenous Every 24 hours 05/31/23 2327 06/01/23 0952         Subjective: Seen and examined.  She has no complaints but appears frustrated for persistent fever.  Objective: Vitals:   06/04/23 0722 06/04/23 1542 06/04/23 1953 06/05/23 0729  BP: (!) 145/82 137/76 92/63 121/79  Pulse: 96 93 84 69  Resp: 18 16 16 18   Temp: (!) 100.7 F (38.2 C) (!) 102 F (38.9 C) 98 F (36.7 C) 98 F (36.7 C)  TempSrc: Oral Oral Oral Oral  SpO2: 99% 97% (!) 89% 100%  Weight:      Height:        Intake/Output Summary (Last 24 hours) at 06/05/2023 1013 Last data filed at 06/04/2023 2223 Gross per 24 hour  Intake 100 ml  Output --  Net 100 ml   Filed Weights   05/31/23 2315  Weight: 73 kg    Examination:  General exam: Appears calm and comfortable  Respiratory system: Clear to auscultation. Respiratory effort normal. Cardiovascular system: S1 & S2 heard, RRR. No JVD, murmurs, rubs, gallops or clicks. No pedal edema. Gastrointestinal system: Abdomen is nondistended, soft and nontender. No organomegaly or masses felt. Normal bowel sounds heard. Central nervous system: Alert and  oriented. No focal neurological deficits. Extremities: Symmetric 5 x 5 power. Skin: No rashes, lesions or ulcers.  Psychiatry: Judgement and insight appear normal. Mood & affect appropriate.   Data Reviewed: I have personally reviewed following labs and imaging studies  CBC: Recent Labs  Lab 05/31/23 2315 06/01/23 1107 06/02/23 0724 06/03/23 0726 06/04/23 1329 06/05/23 0535  WBC 9.9 11.3* 5.4 6.1 5.6 6.0  NEUTROABS 9.6* 10.7*  --   --  4.2 4.0  HGB 9.8* 10.9* 8.9* 9.8* 10.6* 9.4*  HCT 31.8* 34.3* 27.8* 30.9* 33.1* 28.5*  MCV 84.4 86.2 83.2 82.4 80.9 81.4  PLT 344 323 276 294 320 279   Basic Metabolic Panel: Recent Labs  Lab 06/01/23 1107 06/02/23 0724 06/03/23 0726 06/04/23 1329 06/05/23 0535  NA 137 131* 131* 131* 132*  K 3.4* 2.9* 4.1 3.2* 3.2*  CL  105 100 100 97* 100  CO2 21* 22 19* 23 23  GLUCOSE 101* 88 83 106* 95  BUN <5* <5* <5* <5* <5*  CREATININE 0.77 0.91 0.83 0.72 0.68  CALCIUM 7.8* 7.5* 7.9* 8.4* 7.9*   GFR: Estimated Creatinine Clearance: 84 mL/min (by C-G formula based on SCr of 0.68 mg/dL). Liver Function Tests: Recent Labs  Lab 05/31/23 2315 06/01/23 1107 06/02/23 0724 06/03/23 0726 06/05/23 0535  AST 18 36 47* 43* 64*  ALT 15 21 29 31  35  ALKPHOS 43 50 47 84 105  BILITOT 0.7 0.6 0.6 0.5 0.6  PROT 6.5 6.2* 5.4* 5.8* 5.7*  ALBUMIN 2.7* 2.6* 2.2* 2.3* 2.2*   No results for input(s): "LIPASE", "AMYLASE" in the last 168 hours. No results for input(s): "AMMONIA" in the last 168 hours. Coagulation Profile: Recent Labs  Lab 05/31/23 2315  INR 1.2   Cardiac Enzymes: No results for input(s): "CKTOTAL", "CKMB", "CKMBINDEX", "TROPONINI" in the last 168 hours. BNP (last 3 results) No results for input(s): "PROBNP" in the last 8760 hours. HbA1C: No results for input(s): "HGBA1C" in the last 72 hours. CBG: No results for input(s): "GLUCAP" in the last 168 hours. Lipid Profile: No results for input(s): "CHOL", "HDL", "LDLCALC", "TRIG",  "CHOLHDL", "LDLDIRECT" in the last 72 hours. Thyroid Function Tests: No results for input(s): "TSH", "T4TOTAL", "FREET4", "T3FREE", "THYROIDAB" in the last 72 hours. Anemia Panel: No results for input(s): "VITAMINB12", "FOLATE", "FERRITIN", "TIBC", "IRON", "RETICCTPCT" in the last 72 hours. Sepsis Labs: Recent Labs  Lab 06/01/23 0525 06/01/23 1107 06/01/23 1434 06/02/23 0724 06/03/23 0810  PROCALCITON  --   --   --   --  3.21  LATICACIDVEN 2.1* 1.8 2.1* 0.7  --     Recent Results (from the past 240 hour(s))  Blood culture (routine x 2)     Status: None (Preliminary result)   Collection Time: 05/31/23 11:15 PM   Specimen: BLOOD  Result Value Ref Range Status   Specimen Description BLOOD LEFT ANTECUBITAL  Final   Special Requests   Final    BOTTLES DRAWN AEROBIC AND ANAEROBIC Blood Culture results may not be optimal due to an excessive volume of blood received in culture bottles   Culture   Final    NO GROWTH 4 DAYS Performed at [facility], [address] [place], [place] [address]    Report Status PENDING  Incomplete  Resp panel by RT-PCR (RSV, Flu A&B, Covid) Peripheral     Status: None   Collection Time: 05/31/23 11:20 PM   Specimen: Peripheral; Nasal Swab  Result Value Ref Range Status   SARS Coronavirus 2 by RT PCR NEGATIVE NEGATIVE Final   Influenza A by PCR NEGATIVE NEGATIVE Final   Influenza B by PCR NEGATIVE NEGATIVE Final    Comment: (NOTE) The Xpert Xpress SARS-CoV-2/FLU/RSV plus assay is intended as an aid in the diagnosis of influenza from Nasopharyngeal swab specimens and should not be used as a sole basis for treatment. Nasal washings and aspirates are unacceptable for Xpert Xpress SARS-CoV-2/FLU/RSV testing.  Fact Sheet for Patients: BloggerCourse.com  Fact Sheet for Healthcare Providers: SeriousBroker.it  This test is not yet approved or cleared by the [place] FDA and has been authorized  for detection and/or diagnosis of SARS-CoV-2 by FDA under an Emergency Use Authorization (EUA). This EUA will remain in effect (meaning this test can be used) for the duration of the COVID-19 declaration under Section 564(b)(1) of the Act, 21 U.S.C. section 360bbb-3(b)(1), unless the  authorization is terminated or revoked.     Resp Syncytial Virus by PCR NEGATIVE NEGATIVE Final    Comment: (NOTE) Fact Sheet for Patients: BloggerCourse.com  Fact Sheet for Healthcare Providers: SeriousBroker.it  This test is not yet approved or cleared by the [place] FDA and has been authorized for detection and/or diagnosis of SARS-CoV-2 by FDA under an Emergency Use Authorization (EUA). This EUA will remain in effect (meaning this test can be used) for the duration of the COVID-19 declaration under Section 564(b)(1) of the Act, 21 U.S.C. section 360bbb-3(b)(1), unless the authorization is terminated or revoked.  Performed at [facility], [address] [place], [place] [address]   Blood culture (routine x 2)     Status: None (Preliminary result)   Collection Time: 05/31/23 11:30 PM   Specimen: BLOOD RIGHT FOREARM  Result Value Ref Range Status   Specimen Description BLOOD RIGHT FOREARM  Final   Special Requests   Final    BOTTLES DRAWN AEROBIC ONLY Blood Culture results may not be optimal due to an inadequate volume of blood received in culture bottles   Culture   Final    NO GROWTH 4 DAYS Performed at [facility], [address], [place], [place] [address]    Report Status PENDING  Incomplete     Radiology Studies: CT ABDOMEN PELVIS W CONTRAST  Result Date: 06/04/2023 CLINICAL DATA:  Abdominal pain EXAM: CT ABDOMEN AND PELVIS WITH CONTRAST TECHNIQUE: Multidetector CT imaging of the abdomen and pelvis was performed using the standard protocol following bolus administration of intravenous contrast. RADIATION DOSE  REDUCTION: This exam was performed according to the departmental dose-optimization program which includes automated exposure control, adjustment of the mA and/or kV according to patient size and/or use of iterative reconstruction technique. CONTRAST:  75mL OMNIPAQUE IOHEXOL 350 MG/ML SOLN COMPARISON:  CT abdomen and pelvis 06/01/2023 FINDINGS: Lower chest: There are small bilateral pleural effusions with compressive atelectasis in the bilateral lower lobes. Additionally, there are patchy airspace and ground-glass opacities in the bilateral lower lobes and right middle lobe. These findings are new. Hepatobiliary: No focal liver abnormality is seen. No gallstones, gallbladder wall thickening, or biliary dilatation. Pancreas: Unremarkable. No pancreatic ductal dilatation or surrounding inflammatory changes. Spleen: Normal in size without focal abnormality. Adrenals/Urinary Tract: Adrenal glands are unremarkable. Kidneys are normal, without renal calculi, focal lesion, or hydronephrosis. Bladder is unremarkable. Stomach/Bowel: There is circumferential wall thickening inflammatory stranding involving the terminal ileum similar to prior. No definite perforation or abscess identified. The appendix is within normal limits. There is no bowel obstruction. Stomach is within normal limits. Vascular/Lymphatic: No significant vascular findings are present. No enlarged abdominal or pelvic lymph nodes. Reproductive: Lobulated enlarged heterogeneous uterus is again noted compatible with fibroid change. The largest fibroid is on the right measuring 7.8 cm. Left ovarian/adnexal low-attenuation area is rounded measuring 3.8 cm, unchanged from prior, possibly a cyst or complex cyst. Dilated fluid-filled tubular structure in the right adnexa may represent hydrosalpinx similar to the prior study. Other: There is no ascites or focal abdominal wall hernia. There is new mild body wall edema. There is subcutaneous air in the right anterior  abdominal wall likely related to medication injection site. Musculoskeletal: No acute or significant osseous findings. IMPRESSION: 1. Stable wall thickening and inflammation of the terminal ileum compatible with ileitis. No bowel obstruction or abscess. 2. New small bilateral pleural effusions with compressive atelectasis. 3. New patchy airspace and ground-glass opacities in the bilateral lower lobes  and right middle lobe concerning for multifocal pneumonia. 4. New mild body wall edema. 5. Fibroid uterus. 6. Stable left ovarian/adnexal cystic lesion. This can be followed up/further evaluated with pelvic ultrasound. 7. Stable dilated fluid-filled tubular structure in the right adnexa may represent hydrosalpinx. This can be further evaluated with pelvic ultrasound. Electronically Signed   By: Darliss Cheney M.D.   On: 06/04/2023 16:57    Scheduled Meds:  enoxaparin (LOVENOX) injection  40 mg Subcutaneous Q24H   oxymetazoline  1 spray Each Nare BID   potassium chloride  40 mEq Oral Q4H   sodium chloride flush  3 mL Intravenous Q12H   Continuous Infusions:  metronidazole 500 mg (06/04/23 2227)     LOS: 3 days   Hughie Closs, MD [facility]  06/05/2023, 10:13 AM   *Please note that this is a verbal dictation therefore any spelling or grammatical errors are due to the "Dragon Medical One" system interpretation.  Please page via Amion and do not message via secure chat for urgent patient care matters. Secure chat can be used for non urgent patient care matters.  How to contact the [facility] Attending or Consulting provider 7A - 7P or covering provider during after hours 7P -7A, for this patient?  Check the care team in [facility] and look for a) attending/consulting [facility] provider listed and b) the [facility] team listed. Page or secure chat 7A-7P. Log into [email or web address] and use [place]'s universal password to access. If you do not have the password, please contact the hospital operator. Locate the [facility]  provider you are looking for under [facility] and page to a number that you can be directly reached. If you still have difficulty reaching the provider, please page the [facility] (Director on Call) for the Hospitalists listed on amion for assistance.

## 2023-06-06 DIAGNOSIS — R112 Nausea with vomiting, unspecified: Secondary | ICD-10-CM | POA: Diagnosis not present

## 2023-06-06 DIAGNOSIS — A09 Infectious gastroenteritis and colitis, unspecified: Secondary | ICD-10-CM | POA: Diagnosis not present

## 2023-06-06 LAB — CULTURE, BLOOD (ROUTINE X 2)
Culture: NO GROWTH
Culture: NO GROWTH

## 2023-06-06 LAB — GASTROINTESTINAL PANEL BY PCR, STOOL (REPLACES STOOL CULTURE)

## 2023-06-06 LAB — BASIC METABOLIC PANEL
Anion gap: 7 (ref 5–15)
BUN: <5 mg/dL — ABNORMAL LOW (ref 6–20)
CO2: 24 mmol/L (ref 22–32)
Calcium: 8.1 mg/dL — ABNORMAL LOW (ref 8.9–10.3)
Chloride: 103 mmol/L (ref 98–111)
Creatinine, Ser: 0.67 mg/dL (ref 0.44–1.00)
GFR, Estimated: >60 mL/min (ref >60)
Glucose, Bld: 94 mg/dL (ref 70–99)
Potassium: 3.7 mmol/L (ref 3.5–5.1)
Sodium: 134 mmol/L — ABNORMAL LOW (ref 135–145)

## 2023-06-06 MED ORDER — POTASSIUM CHLORIDE 10 MEQ/100ML IV SOLN
10.0000 meq | INTRAVENOUS | Status: AC
  Administered 2023-06-06 (×3): 10 meq via INTRAVENOUS
  Filled 2023-06-06 (×3): qty 100

## 2023-06-06 NOTE — Plan of Care (Signed)

## 2023-06-06 NOTE — Progress Notes (Addendum)
Patient ID: Virginia Bird, female   DOB: 01/10/1981, 42 y.o.   MRN: [id number]    Progress Note   Subjective  Day # 6 CC: acute Ileitis/fever  Antibiotic change to Zosyn  CT yesterday had also shown atelectasis bilateral lower lobes and patchy airspace/groundglass opacities bilateral lower lobes right middle lobe  Cdiff neg GI path - pending Calprotectin - pending  Tmax 100.5  Patient says she is feeling much better today, denies any shortness of breath or cough.  No current abdominal pain no nausea or vomiting tolerating p.o.'s.  Still having diarrhea, 2 small stools today     Objective   Vital signs in last 24 hours: Temp:  [98.2 F (36.8 C)-100.5 F (38.1 C)] 98.5 F (36.9 C) (09/21 0817) Pulse Rate:  [58-84] 58 (09/21 0817) Resp:  [17-18] 18 (09/21 0817) BP: (125-159)/(62-87) 159/87 (09/21 0817) SpO2:  [90 %-100 %] 100 % (09/21 0817) Last BM Date : 06/05/23 General:  African-American female in NAD Heart:  Regular rate and rhythm; no murmurs Lungs: Respirations even and unlabored, clear anteriorly Abdomen:  Soft, nondistended, no significant abdominal tenderness. Normal bowel sounds. Extremities:  Without edema. Neurologic:  Alert and oriented,  grossly normal neurologically. Psych:  Cooperative. Normal mood and affect.  Intake/Output from previous day: No intake/output data recorded. Intake/Output this shift: No intake/output data recorded.  Lab Results: Recent Labs    06/04/23 1329 06/05/23 0535  WBC 5.6 6.0  HGB 10.6* 9.4*  HCT 33.1* 28.5*  PLT 320 279   BMET Recent Labs    06/04/23 1329 06/05/23 0535 06/06/23 0832  NA 131* 132* 134*  K 3.2* 3.2* 3.7  CL 97* 100 103  CO2 23 23 24   GLUCOSE 106* 95 94  BUN <5* <5* <5*  CREATININE 0.72 0.68 0.67  CALCIUM 8.4* 7.9* 8.1*   LFT Recent Labs    06/05/23 0535  PROT 5.7*  ALBUMIN 2.2*  AST 64*  ALT 35  ALKPHOS 105  BILITOT 0.6   PT/INR No results for input(s): "LABPROT", "INR" in the  last 72 hours.  Studies/Results: CT ABDOMEN PELVIS W CONTRAST  Result Date: 06/04/2023 CLINICAL DATA:  Abdominal pain EXAM: CT ABDOMEN AND PELVIS WITH CONTRAST TECHNIQUE: Multidetector CT imaging of the abdomen and pelvis was performed using the standard protocol following bolus administration of intravenous contrast. RADIATION DOSE REDUCTION: This exam was performed according to the departmental dose-optimization program which includes automated exposure control, adjustment of the mA and/or kV according to patient size and/or use of iterative reconstruction technique. CONTRAST:  75mL OMNIPAQUE IOHEXOL 350 MG/ML SOLN COMPARISON:  CT abdomen and pelvis 06/01/2023 FINDINGS: Lower chest: There are small bilateral pleural effusions with compressive atelectasis in the bilateral lower lobes. Additionally, there are patchy airspace and ground-glass opacities in the bilateral lower lobes and right middle lobe. These findings are new. Hepatobiliary: No focal liver abnormality is seen. No gallstones, gallbladder wall thickening, or biliary dilatation. Pancreas: Unremarkable. No pancreatic ductal dilatation or surrounding inflammatory changes. Spleen: Normal in size without focal abnormality. Adrenals/Urinary Tract: Adrenal glands are unremarkable. Kidneys are normal, without renal calculi, focal lesion, or hydronephrosis. Bladder is unremarkable. Stomach/Bowel: There is circumferential wall thickening inflammatory stranding involving the terminal ileum similar to prior. No definite perforation or abscess identified. The appendix is within normal limits. There is no bowel obstruction. Stomach is within normal limits. Vascular/Lymphatic: No significant vascular findings are present. No enlarged abdominal or pelvic lymph nodes. Reproductive: Lobulated enlarged heterogeneous uterus is again noted compatible with  fibroid change. The largest fibroid is on the right measuring 7.8 cm. Left ovarian/adnexal low-attenuation area is  rounded measuring 3.8 cm, unchanged from prior, possibly a cyst or complex cyst. Dilated fluid-filled tubular structure in the right adnexa may represent hydrosalpinx similar to the prior study. Other: There is no ascites or focal abdominal wall hernia. There is new mild body wall edema. There is subcutaneous air in the right anterior abdominal wall likely related to medication injection site. Musculoskeletal: No acute or significant osseous findings. IMPRESSION: 1. Stable wall thickening and inflammation of the terminal ileum compatible with ileitis. No bowel obstruction or abscess. 2. New small bilateral pleural effusions with compressive atelectasis. 3. New patchy airspace and ground-glass opacities in the bilateral lower lobes and right middle lobe concerning for multifocal pneumonia. 4. New mild body wall edema. 5. Fibroid uterus. 6. Stable left ovarian/adnexal cystic lesion. This can be followed up/further evaluated with pelvic ultrasound. 7. Stable dilated fluid-filled tubular structure in the right adnexa may represent hydrosalpinx. This can be further evaluated with pelvic ultrasound. Electronically Signed   By: Darliss Cheney M.D.   On: 06/04/2023 16:57       Assessment / Plan:    #23 42 year old female with acute illness onset 05/30/2023 after eating [place] food with development of nausea vomiting, crampy abdominal pain and fever Met sepsis criteria on admission Started on Rocephin and metronidazole  Initial CT showed circumferential wall thickening of the terminal ileum consistent with an acute ileitis also noted large fibroid uterus.  Blood cultures negative. She had had improvement in some of her GI symptoms but has continued to have diarrhea, and had continued to have fevers, up to 102 on 06/04/2023.  Repeat CT imaging consistent with persistent acute ileitis, and also noted bilateral compressive atelectasis and airspace changes in the lower lobes and right middle lobe  Switch to  Zosyn  Fever curve trending down, clinically patient is feeling better, does continue with some diarrhea  Unclear if persistent fever secondary to the initial acute ileitis/infectious ileitis versus new pneumonia or combination of above  #2 history of chronic iron deficiency anemia felt secondary to menorrhagia d/t fibroids-followed by hematology  #3 history of internal hemorrhoids S/P banding in 2022, prior anal fissure  Plan:   Await GI path panel and fecal calprotectin Continue Zosyn Diet as tolerated   LOS: 4 days   Amy EsterwoodPA-C  06/06/2023, 10:39 AM    Attending Physician Note   I have taken an interval history, reviewed the chart and examined the patient. I performed a substantive portion of this encounter, including complete performance of at least one of the key components, in conjunction with the APP. I agree with the APP's note, impression and recommendations with my edits.    Claudette Head, MD [facility] See AMION, [facility], for our on call provider

## 2023-06-06 NOTE — Progress Notes (Signed)
PROGRESS NOTE    Virginia Bird  ZOX:[id number]  DOB: [birth year]  DOA: 05/31/2023 PCP: Doreene Nest, NP Outpatient Specialists:   Hospital course:  42 year old female with cervical dysplasia, dysfunctional uterine bleeding, IDA and GERD was admitted with nausea vomiting and diarrhea.  Workup revealed thickened terminal ileum consistent with IBD versus infectious enteritis.  Patient was admitted with severe sepsis secondary to small bowel enteritis.  Patient was treated empirically with ceftriaxone and metronidazole however continued to have persistent fevers up to 102 even after 5 days of treatment.  Patient was changed to Zosyn on 06/05/2023.  Of note CT also showed patchy airspace and groundglass opacities in bilateral lower lobes and RML concerning for multifocal pneumonia.   Subjective:  Patient states that she feels much better since yesterday.  Notes "my sinuses are draining, and I am coughing up a lot".  Also notes that her stool are no longer as liquid although they are not quite solid either.  In general she feels much improved.   Objective: Vitals:   06/05/23 2119 06/05/23 2124 06/06/23 0542 06/06/23 0817  BP:  134/77 125/62 (!) 159/87  Pulse:  84 78 (!) 58  Resp:  18 17 18   Temp: 100.2 F (37.9 C) 99.8 F (37.7 C) 98.2 F (36.8 C) 98.5 F (36.9 C)  TempSrc: Oral Oral Oral Oral  SpO2:  100% 90% 100%  Weight:      Height:       No intake or output data in the 24 hours ending 06/06/23 1633 Filed Weights   05/31/23 2315  Weight: 73 kg     Exam:  General: Patient sitting up in bed blowing her nose and coughing. Eyes: sclera anicteric, conjuctiva mild injection bilaterally CVS: S1-S2, regular  Respiratory:  decreased air entry bilaterally secondary to decreased inspiratory effort, rales at bases  GI: NABS, soft, NT  LE: Warm and well-perfused Neuro: A/O x 3,  grossly nonfocal.  Psych: patient is logical and coherent, judgement and insight appear  normal, mood and affect appropriate to situation.  Data Reviewed:  Basic Metabolic Panel: Recent Labs  Lab 06/02/23 0724 06/03/23 0726 06/04/23 1329 06/05/23 0535 06/06/23 0832  NA 131* 131* 131* 132* 134*  K 2.9* 4.1 3.2* 3.2* 3.7  CL 100 100 97* 100 103  CO2 22 19* 23 23 24   GLUCOSE 88 83 106* 95 94  BUN <5* <5* <5* <5* <5*  CREATININE 0.91 0.83 0.72 0.68 0.67  CALCIUM 7.5* 7.9* 8.4* 7.9* 8.1*    CBC: Recent Labs  Lab 05/31/23 2315 06/01/23 1107 06/02/23 0724 06/03/23 0726 06/04/23 1329 06/05/23 0535  WBC 9.9 11.3* 5.4 6.1 5.6 6.0  NEUTROABS 9.6* 10.7*  --   --  4.2 4.0  HGB 9.8* 10.9* 8.9* 9.8* 10.6* 9.4*  HCT 31.8* 34.3* 27.8* 30.9* 33.1* 28.5*  MCV 84.4 86.2 83.2 82.4 80.9 81.4  PLT 344 323 276 294 320 279     Scheduled Meds:  enoxaparin (LOVENOX) injection  40 mg Subcutaneous Q24H   oxymetazoline  1 spray Each Nare BID   sodium chloride flush  3 mL Intravenous Q12H   Continuous Infusions:  piperacillin-tazobactam (ZOSYN)  IV 3.375 g (06/06/23 1359)     Assessment & Plan:   Small bowel enteritis--infectious versus IBD Severe sepsis physiology--resolved Patient seems to be clinically improved since starting Zosyn yesterday. Also of note, she has been afebrile since starting the medications with resolution of previous fever Continue Zosyn day #2 today, she had previously  completed 5 days of ceftriaxone azithromycin without clinical improvement  CAP versus H CAP Patient noted to have bilateral lower lobe and RML patchy airspace disease and groundglass opacities Of note she has not had any oxygen requirement However she notes she does feel better after starting Zosyn and is coughing up phlegm Also notes that her sinuses are draining  Elevated BP Patient does not have a history of HTN Will follow BP while patient is in house  Hypokalemia Resolved with treatment  Dysfunctional uterine bleeding IDA Hemoglobin is stable and at  baseline      DVT prophylaxis: Lovenox Code Status: Full Family Communication: None today     Studies: No results found.  Principal Problem:   Infectious enteritis Active Problems:   Iron deficiency anemia   Dysfunctional uterine bleeding   Severe sepsis (HCC)   Hypokalemia     Donnita Farina Orma Flaming, [facility]  If 7PM-7AM, please contact night-coverage [email or web address]   LOS: 4 days

## 2023-06-07 ENCOUNTER — Inpatient Hospital Stay (HOSPITAL_COMMUNITY): Payer: PRIVATE HEALTH INSURANCE

## 2023-06-07 DIAGNOSIS — J189 Pneumonia, unspecified organism: Secondary | ICD-10-CM

## 2023-06-07 DIAGNOSIS — A09 Infectious gastroenteritis and colitis, unspecified: Secondary | ICD-10-CM | POA: Diagnosis not present

## 2023-06-07 DIAGNOSIS — R112 Nausea with vomiting, unspecified: Secondary | ICD-10-CM | POA: Diagnosis not present

## 2023-06-07 HISTORY — DX: Pneumonia, unspecified organism: J18.9

## 2023-06-07 NOTE — Progress Notes (Signed)
PROGRESS NOTE    LASTACIA SLISZ  ZOX:[id number]  DOB: [birth year]  DOA: 05/31/2023 PCP: Doreene Nest, NP Outpatient Specialists:   Hospital course:  42 year old female with cervical dysplasia, dysfunctional uterine bleeding, IDA and GERD was admitted with nausea vomiting and diarrhea.  Workup revealed thickened terminal ileum consistent with IBD versus infectious enteritis.  Patient was admitted with severe sepsis secondary to small bowel enteritis.  Patient was treated empirically with ceftriaxone and metronidazole however continued to have persistent fevers up to 102 even after 5 days of treatment.  Patient was changed to Zosyn on 06/05/2023.  Of note CT also showed patchy airspace and groundglass opacities in bilateral lower lobes and RML concerning for multifocal pneumonia.   Subjective:  Patient is feeling very frustrated that she is still in the hospital, notes she needs to get back to work.  She does feel better.  However notes that she is not usually on oxygen at home.  I noted that she did have a low-grade fever this morning which is unusual.  Objective: Vitals:   06/06/23 1647 06/06/23 2006 06/07/23 0434 06/07/23 0806  BP: (!) 143/117 117/78 (!) 148/76 113/61  Pulse: 70 82 81 80  Resp: 18 18 18 18   Temp: 98.7 F (37.1 C) 98.7 F (37.1 C) (!) 100.4 F (38 C) 98.5 F (36.9 C)  TempSrc: Oral Oral Oral Oral  SpO2: 92% 95% 99% 100%  Weight:      Height:       No intake or output data in the 24 hours ending 06/07/23 1356 Filed Weights   05/31/23 2315  Weight: 73 kg     Exam:  General: Patient sitting up in bed working on her computer. Eyes: sclera anicteric, conjuctiva mild injection bilaterally CVS: S1-S2, regular  Respiratory:  decreased air entry bilaterally secondary to decreased inspiratory effort, rales at bases  GI: NABS, soft, NT  LE: Warm and well-perfused Neuro: A/O x 3,  grossly nonfocal.  Psych: patient is logical and coherent, judgement  and insight appear normal, mood and affect appropriate to situation.  Data Reviewed:  Basic Metabolic Panel: Recent Labs  Lab 06/02/23 0724 06/03/23 0726 06/04/23 1329 06/05/23 0535 06/06/23 0832  NA 131* 131* 131* 132* 134*  K 2.9* 4.1 3.2* 3.2* 3.7  CL 100 100 97* 100 103  CO2 22 19* 23 23 24   GLUCOSE 88 83 106* 95 94  BUN <5* <5* <5* <5* <5*  CREATININE 0.91 0.83 0.72 0.68 0.67  CALCIUM 7.5* 7.9* 8.4* 7.9* 8.1*    CBC: Recent Labs  Lab 05/31/23 2315 06/01/23 1107 06/02/23 0724 06/03/23 0726 06/04/23 1329 06/05/23 0535  WBC 9.9 11.3* 5.4 6.1 5.6 6.0  NEUTROABS 9.6* 10.7*  --   --  4.2 4.0  HGB 9.8* 10.9* 8.9* 9.8* 10.6* 9.4*  HCT 31.8* 34.3* 27.8* 30.9* 33.1* 28.5*  MCV 84.4 86.2 83.2 82.4 80.9 81.4  PLT 344 323 276 294 320 279     Scheduled Meds:  enoxaparin (LOVENOX) injection  40 mg Subcutaneous Q24H   sodium chloride flush  3 mL Intravenous Q12H   Continuous Infusions:  piperacillin-tazobactam (ZOSYN)  IV 3.375 g (06/07/23 0452)     Assessment & Plan:   Small bowel enteritis--infectious versus IBD Severe sepsis physiology--resolved Patient's diarrhea has resolved completely, may be coincident with starting Zosyn yesterday. GI panel today is entirely negative although GI notes that given use of antibiotics prior to the panel being sent, she may still have a bacterial enteritis  even though the GI panel is negative. Fecal calprotectin level was sent 2 days ago and will be followed up as an outpatient with GI clinic  CAP versus H CAP Patient noted to have bilateral lower lobe and RML patchy airspace disease and groundglass opacities She feels much better on Zosyn started 2 days ago. Of note however patient has low-grade fever spike at 100.4 this morning and new oxygen requirement overnight. Will get chest x-ray to get better picture of lung involvement as the only previous imaging we have is of the bases of her lungs from the abdomen pelvis CT. Patient  is very eager to go home and go back to work.  Will try to wean patient's oxygen back to off and walk her off oxygen to see if she maintains O2 saturations.  Will also follow fever curve and if afebrile tomorrow morning and off of oxygen, would discharge home to complete outpatient treatment for pneumonia.  Elevated BP Patient does not have a history of HTN Will follow BP while patient is in house  Hypokalemia Resolved with treatment  Dysfunctional uterine bleeding IDA Hemoglobin is stable and at baseline      DVT prophylaxis: Lovenox Code Status: Full Family Communication: None today     Studies: No results found.  Principal Problem:   Infectious enteritis Active Problems:   Iron deficiency anemia   Dysfunctional uterine bleeding   Severe sepsis (HCC)   Hypokalemia     Jaeleen Inzunza Orma Flaming, [facility]  If 7PM-7AM, please contact night-coverage [email or web address]   LOS: 5 days

## 2023-06-07 NOTE — Progress Notes (Signed)
Patient ID: Virginia Bird, female   DOB: 09-18-1980, 42 y.o.   MRN: [id number]     Progress Note   Subjective   Day #7 CC; nausea vomiting and diarrhea, acute ileitis Now on Zosyn due to persistent fevers Repeat CT on 06/05/2023 showed persistent acute ileitis, and new development of patchy airspace disease and groundglass opacities bilateral lower lobes and right middle lobe concerning for pneumonia  Patient had been feeling much better yesterday, says she feels about the same today and is feeling a bit down because he spiked another fever last night-100.4 She denies any shortness of breath No abdominal pain, eating without difficulty nausea and vomiting has resolved and abdominal pain has resolved, no diarrhea  GI path panel negative, C. difficile negative, fecal calprotectin still pending  New labs this a.m.     Objective   Vital signs in last 24 hours: Temp:  [98.5 F (36.9 C)-100.4 F (38 C)] 98.5 F (36.9 C) (09/22 0806) Pulse Rate:  [70-82] 80 (09/22 0806) Resp:  [18] 18 (09/22 0806) BP: (113-148)/(61-117) 113/61 (09/22 0806) SpO2:  [92 %-100 %] 100 % (09/22 0806) Last BM Date : 06/06/23 General:    African-American female female in NAD Heart:  Regular rate and rhythm; no murmurs Lungs: Scattered bibasilar rhonchi Abdomen:  Soft, nontender and nondistended. Normal bowel sounds. Extremities:  Without edema. Neurologic:  Alert and oriented,  grossly normal neurologically. Psych:  Cooperative. Normal mood and affect.  Intake/Output from previous day: No intake/output data recorded. Intake/Output this shift: No intake/output data recorded.  Lab Results: Recent Labs    06/04/23 1329 06/05/23 0535  WBC 5.6 6.0  HGB 10.6* 9.4*  HCT 33.1* 28.5*  PLT 320 279   BMET Recent Labs    06/04/23 1329 06/05/23 0535 06/06/23 0832  NA 131* 132* 134*  K 3.2* 3.2* 3.7  CL 97* 100 103  CO2 23 23 24   GLUCOSE 106* 95 94  BUN <5* <5* <5*  CREATININE 0.72 0.68 0.67   CALCIUM 8.4* 7.9* 8.1*   LFT Recent Labs    06/05/23 0535  PROT 5.7*  ALBUMIN 2.2*  AST 64*  ALT 35  ALKPHOS 105  BILITOT 0.6   PT/INR No results for input(s): "LABPROT", "INR" in the last 72 hours.      Assessment / Plan:    #79 42 year old African-American female with acute illness onset 05/30/2023 after eating [place] food with onset of symptoms 3 to 4 hours later with nausea vomiting cramping/abdominal pain diarrhea and fever  She met sepsis criteria on admission and empirically started on Rocephin and metronidazole  Show CT showed an acute ileitis and a large fibroid uterus Blood cultures negative  Symptoms were improving however she continued to spike fevers and repeat CT was done on 06/04/2023 with persistence of the acute ileitis but also showing changes in both lung bases in the right middle lobe concerning for development of a multifocal pneumonia  Now on Zosyn Fever curve is trending down she was up to 100.4 last p.m. GI symptoms have resolved GI path panel and stool for C. difficile both negative-she may still have had a bacterial etiology of the acute ileitis despite the negative GI path panel as she had been on antibiotics for several days prior to that being obtained  Unclear whether the development of the multifocal pneumonia is a separate new problem or related to the original illness  #2 chronic iron deficiency anemia secondary to menorrhagia from known large uterine fibroids Needs  continued iron replacement and referral to GYN as an outpatient  Plan; does not need any further GI workup at this time as GI symptoms have resolved  She should follow-up with Dr. Christeen Douglas GI, in a couple of months, she has any persistence of GI symptoms that would need repeat CT imaging, if symptoms have completely resolved, probably not necessary  GI will sign off, available if needed     Principal Problem:   Infectious enteritis Active Problems:   Iron  deficiency anemia   Dysfunctional uterine bleeding   Severe sepsis (HCC)   Hypokalemia     LOS: 5 days   Samuell Knoble EsterwoodPA-C  06/07/2023, 10:21 AM

## 2023-06-08 DIAGNOSIS — A419 Sepsis, unspecified organism: Secondary | ICD-10-CM

## 2023-06-08 DIAGNOSIS — R6520 Severe sepsis without septic shock: Secondary | ICD-10-CM

## 2023-06-08 HISTORY — DX: Sepsis, unspecified organism: A41.9

## 2023-06-08 LAB — CREATININE, SERUM
Creatinine, Ser: 0.66 mg/dL (ref 0.44–1.00)
GFR, Estimated: >60 mL/min (ref >60)

## 2023-06-08 MED ORDER — AMOXICILLIN-POT CLAVULANATE 875-125 MG PO TABS: 1.0000 | ORAL_TABLET | Freq: Two times a day (BID) | ORAL | 0 refills | Status: AC

## 2023-06-08 NOTE — Plan of Care (Signed)

## 2023-06-08 NOTE — Discharge Instructions (Signed)
Please follow up with your PCP in about a week. Complete your antibiotic treatment

## 2023-06-08 NOTE — Discharge Summary (Signed)
Physician Discharge Summary  Patient: Virginia Bird ZOX:[id number] DOB: July 24, 1981   Code Status: Full Code Admit date: 05/31/2023 Discharge date: 06/08/2023 Disposition: Home, No home health services recommended PCP: Doreene Nest, NP  Recommendations for Outpatient Follow-up:  Follow up with PCP within 1-2 weeks Regarding general hospital follow up and preventative care Follow up with GI  Discharge Diagnoses:  Principal Problem:   Infectious enteritis Active Problems:   Iron deficiency anemia   Dysfunctional uterine bleeding   Severe sepsis (HCC)   Hypokalemia   Pneumonia of both lungs due to infectious organism   Sepsis with acute organ dysfunction without septic shock [facility])  Brief Hospital Course Summary: 42 year old female with cervical dysplasia, dysfunctional uterine bleeding, IDA and GERD was admitted with nausea vomiting and diarrhea. Workup revealed thickened terminal ileum consistent with IBD versus infectious enteritis. Patient was admitted with severe sepsis secondary to small bowel enteritis. Patient was treated empirically with ceftriaxone and metronidazole however continued to have persistent fevers up to 102 even after 5 days of treatment. Patient was changed to Zosyn on 06/05/2023. Of note CT also showed patchy airspace and groundglass opacities in bilateral lower lobes and RML concerning for multifocal pneumonia.   Discharge Condition: Good, improved Recommended discharge diet: Regular healthy diet  Consultations: GI  Procedures/Studies: ***  Discharge Instructions     Discharge patient   Complete by: As directed    Discharge disposition: 01-Home or Self Care   Discharge patient date: 06/08/2023      Allergies as of 06/08/2023   No Known Allergies      Medication List     STOP taking these medications    ferrous gluconate 324 MG tablet Commonly known as: FERGON       TAKE these medications    amoxicillin-clavulanate 875-125 MG  tablet Commonly known as: AUGMENTIN Take 1 tablet by mouth 2 (two) times daily for 5 days.   EPINEPHrine 0.3 mg/0.3 mL Soaj injection Commonly known as: EPI-PEN Inject 0.3 mg into the muscle as needed for anaphylaxis.   famotidine 20 MG tablet Commonly known as: PEPCID TAKE 1 TABLET (20 MG TOTAL) BY MOUTH DAILY. FOR HEARTBURN.   fluticasone 50 MCG/ACT nasal spray Commonly known as: FLONASE Place 1 spray into both nostrils 2 (two) times daily as needed for allergies or rhinitis.   naproxen 500 MG tablet Commonly known as: NAPROSYN Take 1 tablet (500 mg total) by mouth 2 (two) times daily with a meal. As needed for cramping What changed:  when to take this reasons to take this additional instructions         Subjective   Pt reports ***  All questions and concerns were addressed at time of discharge.  Objective  Blood pressure (!) 154/85, pulse 62, temperature 98.4 F (36.9 C), temperature source Oral, resp. rate 17, height 5\' 2"  (1.575 m), weight 73 kg, SpO2 95%.   General: Pt is alert, awake, not in acute distress Cardiovascular: RRR, S1/S2 +, no rubs, no gallops Respiratory: CTA bilaterally, no wheezing, no rhonchi Abdominal: Soft, NT, ND, bowel sounds + Extremities: no edema, no cyanosis  The results of significant diagnostics from this hospitalization (including imaging, microbiology, ancillary and laboratory) are listed below for reference.   Imaging studies: DG CHEST PORT 1 VIEW  Result Date: 06/07/2023 CLINICAL DATA:  [id number] Fever [id number] EXAM: PORTABLE CHEST 1 VIEW COMPARISON:  May 31, 2023 FINDINGS: The cardiomediastinal silhouette is unchanged in contour. No pleural effusion. No pneumothorax. Increased bilateral reticulonodular  opacities with peribronchial cuffing. Patchy LEFT retrocardiac heterogeneous opacities, increased. IMPRESSION: 1. Increased bilateral reticulonodular opacities with peribronchial cuffing. Findings are favored to reflect atypical  infection. Differential considerations include pulmonary edema. 2. Increased patchy LEFT retrocardiac opacities. Electronically Signed   By: Meda Klinefelter M.D.   On: 06/07/2023 17:18   CT ABDOMEN PELVIS W CONTRAST  Result Date: 06/04/2023 CLINICAL DATA:  Abdominal pain EXAM: CT ABDOMEN AND PELVIS WITH CONTRAST TECHNIQUE: Multidetector CT imaging of the abdomen and pelvis was performed using the standard protocol following bolus administration of intravenous contrast. RADIATION DOSE REDUCTION: This exam was performed according to the departmental dose-optimization program which includes automated exposure control, adjustment of the mA and/or kV according to patient size and/or use of iterative reconstruction technique. CONTRAST:  75mL OMNIPAQUE IOHEXOL 350 MG/ML SOLN COMPARISON:  CT abdomen and pelvis 06/01/2023 FINDINGS: Lower chest: There are small bilateral pleural effusions with compressive atelectasis in the bilateral lower lobes. Additionally, there are patchy airspace and ground-glass opacities in the bilateral lower lobes and right middle lobe. These findings are new. Hepatobiliary: No focal liver abnormality is seen. No gallstones, gallbladder wall thickening, or biliary dilatation. Pancreas: Unremarkable. No pancreatic ductal dilatation or surrounding inflammatory changes. Spleen: Normal in size without focal abnormality. Adrenals/Urinary Tract: Adrenal glands are unremarkable. Kidneys are normal, without renal calculi, focal lesion, or hydronephrosis. Bladder is unremarkable. Stomach/Bowel: There is circumferential wall thickening inflammatory stranding involving the terminal ileum similar to prior. No definite perforation or abscess identified. The appendix is within normal limits. There is no bowel obstruction. Stomach is within normal limits. Vascular/Lymphatic: No significant vascular findings are present. No enlarged abdominal or pelvic lymph nodes. Reproductive: Lobulated enlarged  heterogeneous uterus is again noted compatible with fibroid change. The largest fibroid is on the right measuring 7.8 cm. Left ovarian/adnexal low-attenuation area is rounded measuring 3.8 cm, unchanged from prior, possibly a cyst or complex cyst. Dilated fluid-filled tubular structure in the right adnexa may represent hydrosalpinx similar to the prior study. Other: There is no ascites or focal abdominal wall hernia. There is new mild body wall edema. There is subcutaneous air in the right anterior abdominal wall likely related to medication injection site. Musculoskeletal: No acute or significant osseous findings. IMPRESSION: 1. Stable wall thickening and inflammation of the terminal ileum compatible with ileitis. No bowel obstruction or abscess. 2. New small bilateral pleural effusions with compressive atelectasis. 3. New patchy airspace and ground-glass opacities in the bilateral lower lobes and right middle lobe concerning for multifocal pneumonia. 4. New mild body wall edema. 5. Fibroid uterus. 6. Stable left ovarian/adnexal cystic lesion. This can be followed up/further evaluated with pelvic ultrasound. 7. Stable dilated fluid-filled tubular structure in the right adnexa may represent hydrosalpinx. This can be further evaluated with pelvic ultrasound. Electronically Signed   By: Darliss Cheney M.D.   On: 06/04/2023 16:57   CT ABDOMEN PELVIS W CONTRAST  Result Date: 06/01/2023 CLINICAL DATA:  Sepsis.  Acute abdominal pain EXAM: CT ABDOMEN AND PELVIS WITH CONTRAST TECHNIQUE: Multidetector CT imaging of the abdomen and pelvis was performed using the standard protocol following bolus administration of intravenous contrast. RADIATION DOSE REDUCTION: This exam was performed according to the departmental dose-optimization program which includes automated exposure control, adjustment of the mA and/or kV according to patient size and/or use of iterative reconstruction technique. CONTRAST:  75mL OMNIPAQUE IOHEXOL 350  MG/ML SOLN COMPARISON:  01/17/2009 FINDINGS: Lower chest: Streaky density at the lung bases attributed atelectasis. Hepatobiliary: No focal liver abnormality.No evidence of  biliary obstruction or stone. Pancreas: Unremarkable. Spleen: Unremarkable. Adrenals/Urinary Tract: Negative adrenals. No hydronephrosis or stone. Unremarkable bladder. Stomach/Bowel: Thick walled terminal ileum with wall thickening and enhancement greatest just before the ileocecal valve. More proximally there is low-density submucosal expansion. The regional mesentery is edematous and there is ileocolic mild lymph node enlargement without cavitation. Small volume regional peritoneal fluid Vascular/Lymphatic: No acute vascular abnormality. No mass or adenopathy. Reproductive:Enlarged uterus with multiple myometrial masses, the largest in the right body measuring nearly 9 cm. Dominant follicle in the left ovary. Other: No generalized ascites or pneumoperitoneum. Musculoskeletal: No acute abnormalities. IMPRESSION: 1. Thickening of the terminal ileum which could be inflammatory bowel disease or infectious enteritis. Greater mural enhancement with some indistinctness of wall just before the ileocecal valve, suggest follow-up imaging to exclude mass (not favored) or developing wall breakdown/penetrating disease. 2. Enlarged and fibroid uterus. Electronically Signed   By: Tiburcio Pea M.D.   On: 06/01/2023 04:23   DG Chest Port 1 View  Result Date: 05/31/2023 CLINICAL DATA:  Sinus infection.  Self medicating.  Fever. EXAM: PORTABLE CHEST 1 VIEW COMPARISON:  None Available. FINDINGS: Normal cardiomediastinal silhouette. No focal consolidation, pleural effusion, or pneumothorax. No displaced rib fractures. IMPRESSION: No acute cardiopulmonary disease. Electronically Signed   By: Minerva Fester M.D.   On: 05/31/2023 23:45    Labs: Basic Metabolic Panel: Recent Labs  Lab 06/02/23 0724 06/03/23 0726 06/04/23 1329 06/05/23 0535  06/06/23 0832 06/08/23 0746  NA 131* 131* 131* 132* 134*  --   K 2.9* 4.1 3.2* 3.2* 3.7  --   CL 100 100 97* 100 103  --   CO2 22 19* 23 23 24   --   GLUCOSE 88 83 106* 95 94  --   BUN <5* <5* <5* <5* <5*  --   CREATININE 0.91 0.83 0.72 0.68 0.67 0.66  CALCIUM 7.5* 7.9* 8.4* 7.9* 8.1*  --    CBC: Recent Labs  Lab 06/02/23 0724 06/03/23 0726 06/04/23 1329 06/05/23 0535  WBC 5.4 6.1 5.6 6.0  NEUTROABS  --   --  4.2 4.0  HGB 8.9* 9.8* 10.6* 9.4*  HCT 27.8* 30.9* 33.1* 28.5*  MCV 83.2 82.4 80.9 81.4  PLT 276 294 320 279   Microbiology: ***  Time coordinating discharge: Over 30 minutes  Leeroy Bock, MD  [facility] 06/08/2023, 12:21 PM

## 2023-06-08 NOTE — Progress Notes (Incomplete)
PROGRESS NOTE    Virginia Bird  OZD:[id number]  DOB: 12-06-1980  DOA: 05/31/2023 PCP: Doreene Nest, NP Outpatient Specialists:   Hospital course:  42 year old female with cervical dysplasia, dysfunctional uterine bleeding, IDA and GERD was admitted with nausea vomiting and diarrhea.  Workup revealed thickened terminal ileum consistent with IBD versus infectious enteritis.  Patient was admitted with severe sepsis secondary to small bowel enteritis.  Patient was treated empirically with ceftriaxone and metronidazole however continued to have persistent fevers up to 102 even after 5 days of treatment.  Patient was changed to Zosyn on 06/05/2023.  Of note CT also showed patchy airspace and groundglass opacities in bilateral lower lobes and RML concerning for multifocal pneumonia.   Subjective:  Patient is feeling very frustrated that she is still in the hospital, notes she needs to get back to work.  She does feel better.  However notes that she is not usually on oxygen at home.  I noted that she did have a low-grade fever this morning which is unusual.  Objective: Vitals:   06/07/23 0806 06/07/23 1733 06/07/23 2006 06/08/23 0716  BP: 113/61 (!) 146/76 (!) 154/82 (!) 154/85  Pulse: 80 66 72 62  Resp: 18 19 18 17   Temp: 98.5 F (36.9 C) 99 F (37.2 C) 99.4 F (37.4 C) 98.4 F (36.9 C)  TempSrc: Oral Oral  Oral  SpO2: 100% 100% 99% 94%  Weight:      Height:       No intake or output data in the 24 hours ending 06/08/23 0758 Filed Weights   05/31/23 2315  Weight: 73 kg     Exam:  General: Patient sitting up in bed working on her computer. Eyes: sclera anicteric, conjuctiva mild injection bilaterally CVS: S1-S2, regular  Respiratory:  decreased air entry bilaterally secondary to decreased inspiratory effort, rales at bases  GI: NABS, soft, NT  LE: Warm and well-perfused Neuro: A/O x 3,  grossly nonfocal.  Psych: patient is logical and coherent, judgement and  insight appear normal, mood and affect appropriate to situation.  Data Reviewed:  Basic Metabolic Panel: Recent Labs  Lab 06/02/23 0724 06/03/23 0726 06/04/23 1329 06/05/23 0535 06/06/23 0832  NA 131* 131* 131* 132* 134*  K 2.9* 4.1 3.2* 3.2* 3.7  CL 100 100 97* 100 103  CO2 22 19* 23 23 24   GLUCOSE 88 83 106* 95 94  BUN <5* <5* <5* <5* <5*  CREATININE 0.91 0.83 0.72 0.68 0.67  CALCIUM 7.5* 7.9* 8.4* 7.9* 8.1*    CBC: Recent Labs  Lab 06/01/23 1107 06/02/23 0724 06/03/23 0726 06/04/23 1329 06/05/23 0535  WBC 11.3* 5.4 6.1 5.6 6.0  NEUTROABS 10.7*  --   --  4.2 4.0  HGB 10.9* 8.9* 9.8* 10.6* 9.4*  HCT 34.3* 27.8* 30.9* 33.1* 28.5*  MCV 86.2 83.2 82.4 80.9 81.4  PLT 323 276 294 320 279     Scheduled Meds:  enoxaparin (LOVENOX) injection  40 mg Subcutaneous Q24H   sodium chloride flush  3 mL Intravenous Q12H   Continuous Infusions:  piperacillin-tazobactam (ZOSYN)  IV 3.375 g (06/08/23 0500)     Assessment & Plan:   Small bowel enteritis--infectious versus IBD Severe sepsis physiology--resolved Patient's diarrhea has resolved completely, may be coincident with starting Zosyn yesterday. GI panel today is entirely negative although GI notes that given use of antibiotics prior to the panel being sent, she may still have a bacterial enteritis even though the GI panel is negative. Fecal  calprotectin level was sent 2 days ago and will be followed up as an outpatient with GI clinic  CAP versus H CAP Patient noted to have bilateral lower lobe and RML patchy airspace disease and groundglass opacities She feels much better on Zosyn started 2 days ago. Of note however patient has low-grade fever spike at 100.4 this morning and new oxygen requirement overnight. Will get chest x-ray to get better picture of lung involvement as the only previous imaging we have is of the bases of her lungs from the abdomen pelvis CT. Patient is very eager to go home and go back to work.   Will try to wean patient's oxygen back to off and walk her off oxygen to see if she maintains O2 saturations.  Will also follow fever curve and if afebrile tomorrow morning and off of oxygen, would discharge home to complete outpatient treatment for pneumonia.  Elevated BP Patient does not have a history of HTN Will follow BP while patient is in house  Hypokalemia Resolved with treatment  Dysfunctional uterine bleeding IDA Hemoglobin is stable and at baseline      DVT prophylaxis: Lovenox Code Status: Full Family Communication: None today     Studies: DG CHEST PORT 1 VIEW  Result Date: 06/07/2023 CLINICAL DATA:  [id number] Fever [id number] EXAM: PORTABLE CHEST 1 VIEW COMPARISON:  May 31, 2023 FINDINGS: The cardiomediastinal silhouette is unchanged in contour. No pleural effusion. No pneumothorax. Increased bilateral reticulonodular opacities with peribronchial cuffing. Patchy LEFT retrocardiac heterogeneous opacities, increased. IMPRESSION: 1. Increased bilateral reticulonodular opacities with peribronchial cuffing. Findings are favored to reflect atypical infection. Differential considerations include pulmonary edema. 2. Increased patchy LEFT retrocardiac opacities. Electronically Signed   By: Meda Klinefelter M.D.   On: 06/07/2023 17:18    Principal Problem:   Infectious enteritis Active Problems:   Iron deficiency anemia   Dysfunctional uterine bleeding   Severe sepsis (HCC)   Hypokalemia   Pneumonia of both lungs due to infectious organism     Leeroy Bock, [facility]  If 7PM-7AM, please contact night-coverage [email or web address]   LOS: 6 days

## 2023-06-09 ENCOUNTER — Inpatient Hospital Stay: Payer: PRIVATE HEALTH INSURANCE

## 2023-06-09 ENCOUNTER — Telehealth: Payer: Self-pay | Admitting: *Deleted

## 2023-06-09 LAB — CALPROTECTIN, FECAL: Calprotectin, Fecal: 660 ug/g — ABNORMAL HIGH (ref 0–120)

## 2023-06-09 NOTE — Transitions of Care (Post Inpatient/ED Visit) (Signed)
06/09/2023  Name: Virginia Bird MRN: [id number] DOB: May 17, 1981  [name]: [name]:: Successful TOC FU Call Completed TOC FU Call Complete Date: 06/09/23 Patient's Name and Date of Birth confirmed.  Transition Care Management Follow-up Telephone Call Date of Discharge: 06/08/23 Discharge Facility: [facility]) Type of Discharge: Inpatient Admission Primary Inpatient Discharge Diagnosis:: sepsis How have you been since you were released from the hospital?: Better Any questions or concerns?: No  Items Reviewed: Medications obtained,verified, and reconciled?: Yes (Medications Reviewed) Any new allergies since your discharge?: No Dietary orders reviewed?: No Do you have support at home?: Yes People in Home: alone Name of Support/Comfort Primary Source: judy mother  Medications Reviewed Today: Medications Reviewed Today     Reviewed by Luella Cook, RN (Case Manager) on 06/09/23 at 1056  Med List Status: <None>   Medication Order Taking? Sig Documenting Provider Last Dose Status Informant  amoxicillin-clavulanate (AUGMENTIN) 875-125 MG tablet [id number] Yes Take 1 tablet by mouth 2 (two) times daily for 5 days. Leeroy Bock, MD Taking Active   EPINEPHrine 0.3 mg/0.3 mL IJ SOAJ injection [id number] Yes Inject 0.3 mg into the muscle as needed for anaphylaxis. Doreene Nest, NP Taking Active Self  famotidine (PEPCID) 20 MG tablet [id number] Yes TAKE 1 TABLET (20 MG TOTAL) BY MOUTH DAILY. FOR HEARTBURN. Doreene Nest, NP Taking Active Self  fluticasone (FLONASE) 50 MCG/ACT nasal spray [id number] Yes Place 1 spray into both nostrils 2 (two) times daily as needed for allergies or rhinitis. Doreene Nest, NP Taking Active Self  naproxen (NAPROSYN) 500 MG tablet [id number] Yes Take 1 tablet (500 mg total) by mouth 2 (two) times daily with a meal. As needed for cramping  Patient taking differently: Take 500 mg by mouth daily  as needed (for cramping).   Doreene Nest, NP Taking Active Self            Home Care and Equipment/Supplies: Were Home Health Services Ordered?: NA Any new equipment or medical supplies ordered?: NA  Functional Questionnaire: Do you need assistance with bathing/showering or dressing?: No Do you need assistance with meal preparation?: No Do you need assistance with eating?: No Do you have difficulty maintaining continence: No Do you need assistance with getting out of bed/getting out of a chair/moving?: No  Follow up appointments reviewed: PCP Follow-up appointment confirmed?: No MD Provider Line [phone] Given: Yes (patient stated she will call her today for an appt) Specialist Hospital Follow-up appointment confirmed?: No Reason Specialist Follow-Up Not Confirmed: Patient has Specialist Provider Number and will Call for Appointment Do you need transportation to your follow-up appointment?: No Do you understand care options if your condition(s) worsen?: Yes-patient verbalized understanding  SDOH Interventions Today    Flowsheet Row Most Recent Value  SDOH Interventions   Food Insecurity Interventions Intervention Not Indicated  Housing Interventions Intervention Not Indicated  Transportation Interventions Intervention Not Indicated  Utilities Interventions Intervention Not Indicated      Interventions Today    Flowsheet Row Most Recent Value  General Interventions   General Interventions Discussed/Reviewed General Interventions Discussed, General Interventions Reviewed, Referral to Nurse, Doctor Visits  [Declined care coordination]  Doctor Visits Discussed/Reviewed Doctor Visits Discussed  Pharmacy Interventions   Pharmacy Dicussed/Reviewed Pharmacy Topics Discussed      TOC Interventions Today    Flowsheet Row Most Recent Value  TOC Interventions   TOC Interventions Discussed/Reviewed TOC Interventions Discussed, TOC Interventions Reviewed  Gean Maidens BSN RN [facility] [phone].

## 2023-06-10 ENCOUNTER — Telehealth: Payer: Self-pay | Admitting: Primary Care

## 2023-06-10 NOTE — Telephone Encounter (Signed)
Called and advised patient of Virginia Bird message. Patient verbalized understanding, scheduled hosp f/u on 06/25/23 per patient request. Patient states she is feeling much better than when she was in hospital, she is still dealing with a 'nagging cough'. Pt stated she will callback and move appt up if she starts feeling worse.

## 2023-06-10 NOTE — Telephone Encounter (Signed)
Patient called in and stated that she was in the hospital for 8 days. She stated that she would like Virginia Bird to look over her hospital notes because they informed her to reach out to her PCP and schedule a visit. Informed patient that we can get her scheduled for a hospital follow up, but she declined stating that she would rather discuss this with Virginia Bird because she still isn't feeling well. She would like for Virginia Bird to call her. Please advise. Thank you!

## 2023-06-10 NOTE — Telephone Encounter (Signed)
Please tell her that our reviewed the hospitalization and recommend that we meet for follow-up.  Looks like we need to repeat some labs.  Is she feeling better from her hospital stay? Worse?

## 2023-06-10 NOTE — Telephone Encounter (Signed)
Noted  

## 2023-06-11 ENCOUNTER — Inpatient Hospital Stay: Payer: PRIVATE HEALTH INSURANCE | Admitting: Oncology

## 2023-06-11 ENCOUNTER — Ambulatory Visit: Payer: BC Managed Care – PPO

## 2023-06-25 ENCOUNTER — Encounter: Payer: Self-pay | Admitting: Primary Care

## 2023-06-25 ENCOUNTER — Ambulatory Visit: Payer: PRIVATE HEALTH INSURANCE | Admitting: Primary Care

## 2023-06-25 VITALS — BP 122/86 | HR 103 | Temp 97.4°F | Ht 62.0 in | Wt 148.0 lb

## 2023-06-25 DIAGNOSIS — J3489 Other specified disorders of nose and nasal sinuses: Secondary | ICD-10-CM | POA: Insufficient documentation

## 2023-06-25 DIAGNOSIS — A09 Infectious gastroenteritis and colitis, unspecified: Secondary | ICD-10-CM

## 2023-06-25 DIAGNOSIS — J189 Pneumonia, unspecified organism: Secondary | ICD-10-CM | POA: Diagnosis not present

## 2023-06-25 LAB — CBC WITH DIFFERENTIAL/PLATELET
Basophils Absolute: 0 10*3/uL (ref 0.0–0.1)
Basophils Relative: 0.5 % (ref 0.0–3.0)
Eosinophils Absolute: 0.1 10*3/uL (ref 0.0–0.7)
Eosinophils Relative: 2.7 % (ref 0.0–5.0)
HCT: 34.8 % — ABNORMAL LOW (ref 36.0–46.0)
Hemoglobin: 10.8 g/dL — ABNORMAL LOW (ref 12.0–15.0)
Lymphocytes Relative: 26.2 % (ref 12.0–46.0)
Lymphs Abs: 1.1 10*3/uL (ref 0.7–4.0)
MCHC: 31 g/dL (ref 30.0–36.0)
MCV: 79.9 fL (ref 78.0–100.0)
Monocytes Absolute: 0.4 10*3/uL (ref 0.1–1.0)
Monocytes Relative: 9.3 % (ref 3.0–12.0)
Neutro Abs: 2.7 10*3/uL (ref 1.4–7.7)
Neutrophils Relative %: 61.3 % (ref 43.0–77.0)
Platelets: 323 10*3/uL (ref 150.0–400.0)
RBC: 4.35 Mil/uL (ref 3.87–5.11)
RDW: 16 % — ABNORMAL HIGH (ref 11.5–15.5)
WBC: 4.4 10*3/uL (ref 4.0–10.5)

## 2023-06-25 LAB — BASIC METABOLIC PANEL
BUN: 6 mg/dL (ref 6–23)
CO2: 28 meq/L (ref 19–32)
Calcium: 9.1 mg/dL (ref 8.4–10.5)
Chloride: 103 meq/L (ref 96–112)
Creatinine, Ser: 0.58 mg/dL (ref 0.40–1.20)
GFR: 110.25 mL/min (ref >60.00)
Glucose, Bld: 84 mg/dL (ref 70–99)
Potassium: 3.7 meq/L (ref 3.5–5.1)
Sodium: 138 meq/L (ref 135–145)

## 2023-06-25 MED ORDER — PREDNISONE 20 MG PO TABS
ORAL_TABLET | ORAL | 0 refills | Status: DC
Start: 2023-06-25 — End: 2023-06-30

## 2023-06-25 NOTE — Assessment & Plan Note (Addendum)
Doubt bacterial cause at this point given the numerous antibiotics provided during hospital stay. Fevers are concerning, unclear etiology. Consider Covid-19 infection.   Start prednisone 20 mg tablets. Take 2 tablets by mouth once daily in the morning for 5 days.  Will covid test if no improvement.

## 2023-06-25 NOTE — Progress Notes (Signed)
Subjective:    Patient ID: Virginia Bird, female    DOB: [birth year], 42 y.o.   MRN: [id number]  HPI  Virginia Bird is a very pleasant 42 y.o. female with a history of GERD, infectious enteritis, community-acquired pneumonia, severe sepsis who presents today for hospital follow-up.  She presented to [facility] ED on 05/31/2023 for abdominal pain, nausea and vomiting after eating [place] food the previous night.  Also with loose stools, fevers, and symptoms of sinusitis.  She also mentions being on vacation in [place] in August and noticed diffuse lower abdominal pain with vomiting and loose stools at that point.  She was admitted for small bowel enteritis with sepsis vs IBD. She underwent treatment with IV antibiotics (ceftriaxone and metronidazole) but this was switched to Zosyn given continued fevers. She was also noted to have bilateral lower lobe pneumonia and required transient oxygen supplementation up to 4 liters per nasal canula. GI consulted who recommended outpatient follow up in a few months to consider colonoscopy. She was discharged home on 06/08/23.   Since her discharge home her abdominal pain, nausea, and vomiting have resolved. Her sinusitis symptoms resolved until she returned to work on 06/09/23.  Symptoms include low grade fever (99-100.3) daily, head congestion, nasal congestion. She's been taking Flonase, steamy shower with Vics tabs, Sudafed tablets for 5-6 days without improvement.  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 She's been wearing a hat to keep herself warm. When she gets home she feels improved, but symptoms began again when she returns to work.    Review of Systems  Constitutional:  Positive for fever.  HENT:  Positive for congestion and sinus pressure.   Respiratory:  Negative for cough and shortness of breath.   Cardiovascular:  Negative for chest pain.  Gastrointestinal:  Negative for abdominal pain, blood in stool,  nausea and vomiting.         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/21/2021   UTI (urinary tract infection)     Social History   Socioeconomic History   Marital status: Single    Spouse name: Not on file   Number of children: 1   Years of education: Not on file   Highest education level: Not on file  Occupational History   Not on file  Tobacco Use   Smoking status: Every Day    Current packs/day: 0.25    Average packs/day: 0.3 packs/day for 10.0 years (2.5 ttl pk-yrs)    Types: Cigarettes   Smokeless tobacco: Never   Tobacco comments:    Has not tried any quit aids/tn  Vaping Use   Vaping status: Never Used  Substance and Sexual Activity   Alcohol use: Yes    Comment: daily cocktail   Drug use: No   Sexual activity: Yes    Partners: Male  Other Topics Concern   Not on file  Social History Narrative   Single.    1 daughter.    Works at [employer].   Enjoys going to [employer].    Social Determinants of Health   Financial Resource Strain: Not on file  Food Insecurity: No Food Insecurity (06/09/2023)   Hunger Vital Sign    Worried About Running Out of Food in the Last Year: Never true  Ran Out of Food in the Last Year: Never true  Transportation Needs: No Transportation Needs (06/09/2023)   PRAPARE - Administrator, Civil Service (Medical): No    Lack of Transportation (Non-Medical): No  Physical Activity: Not on file  Stress: Not on file  Social Connections: Not on file  Intimate Partner Violence: Not At Risk (06/01/2023)   Humiliation, Afraid, Rape, and Kick questionnaire    Fear of Current or Ex-Partner: No    Emotionally Abused: No    Physically Abused: No    Sexually Abused: No    Past Surgical History:  Procedure Laterality Date   DILATION AND CURETTAGE OF UTERUS  09/15/2000   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   HEMORRHOID SURGERY N/A 11/08/2021   Procedure: HEMORRHOIDECTOMY;  Surgeon: Campbell Lerner, MD;  Location: [facility] ORS;  Service: General;  Laterality: N/A;   RECTAL EXAM UNDER ANESTHESIA N/A 11/08/2021   Procedure: RECTAL EXAM UNDER ANESTHESIA;  Surgeon: Campbell Lerner, MD;  Location: [facility] ORS;  Service: General;  Laterality: N/A;   WISDOM TOOTH EXTRACTION      Family History  Problem Relation Age of Onset   Thyroid disease Mother    Hypertension Mother    Cancer Father        leukemia   Stroke Father    Cancer Maternal Aunt        Breast Cancer   Breast cancer Maternal Aunt    Asthma Sister    Miscarriages / Stillbirths Sister     No Known Allergies  Current Outpatient Medications on File Prior to Visit  Medication Sig Dispense Refill   EPINEPHrine 0.3 mg/0.3 mL IJ SOAJ injection Inject 0.3 mg into the muscle as needed for anaphylaxis. 1 each 0   famotidine (PEPCID) 20 MG tablet TAKE 1 TABLET (20 MG TOTAL) BY MOUTH DAILY. FOR HEARTBURN. 90 tablet 2   fluticasone (FLONASE) 50 MCG/ACT nasal spray Place 1 spray into both nostrils 2 (two) times daily as needed for allergies or rhinitis. 48 mL 0   naproxen (NAPROSYN) 500 MG tablet Take 1 tablet (500 mg total) by mouth 2 (two) times daily with a meal. As needed for cramping (Patient taking differently: Take 500 mg by mouth daily as needed (for cramping).) 60 tablet 1   No current facility-administered medications on file prior to visit.    BP 122/86   Pulse (!) 103   Temp (!) 97.4 F (36.3 C) (Temporal)   Ht 5\' 2"  (1.575 m)   Wt 148 lb (67.1 kg)   SpO2 98%   BMI 27.07 kg/m  Objective:   Physical Exam Constitutional:      Appearance: She is not ill-appearing.  HENT:     Nose: No mucosal edema.     Right Sinus: No maxillary sinus tenderness or frontal sinus tenderness.     Left Sinus: No maxillary sinus tenderness or frontal sinus tenderness.  Cardiovascular:     Rate and Rhythm: Normal  rate and regular rhythm.  Pulmonary:     Effort: Pulmonary effort is normal.     Breath sounds: Normal breath sounds. No wheezing or rhonchi.  Abdominal:     General: Bowel sounds are normal.     Palpations: Abdomen is soft.     Tenderness: There is no abdominal tenderness.  Musculoskeletal:     Cervical back: Neck supple.  Skin:    General: Skin is warm and dry.  Assessment & Plan:  Infectious enteritis, unspecified infectious agent Assessment & Plan: Recent hospital admission. Reviewed hospital notes, labs, imaging.  Seems to be much better as symptoms have mostly resolved. Discussed referral to GI, she agrees, referral placed.  Checking CBC and BMP today.   Orders: -     Ambulatory referral to Gastroenterology -     CBC with Differential/Platelet -     Basic metabolic panel  Sinus pressure Assessment & Plan: Doubt bacterial cause at this point given the numerous antibiotics provided during hospital stay. Fevers are concerning, unclear etiology. Consider Covid-19 infection but she has no other symptoms.   Start prednisone 20 mg tablets. Take 2 tablets by mouth once daily in the morning for 5 days.  Will covid test if no improvement.   Orders: -     predniSONE; Take 2 tablets by mouth once daily for 5 days.  Dispense: 10 tablet; Refill: 0  Pneumonia of both lower lobes due to infectious organism Assessment & Plan: Exam today with clear lungs. Repeat chest xray during the week of October 20th.   Orders placed.   Hospital notes, imaging, labs reviewed.   Orders: -     DG Chest 2 View; Future        Doreene Nest, NP

## 2023-06-25 NOTE — Assessment & Plan Note (Signed)
Recent hospital admission. Reviewed hospital notes, labs, imaging.  Seems to be much better as symptoms have mostly resolved. Discussed referral to GI, she agrees, referral placed.  Checking CBC and BMP today.

## 2023-06-25 NOTE — Assessment & Plan Note (Signed)
Exam today with clear lungs. Repeat chest xray during the week of October 20th.   Orders placed.   Hospital notes, imaging, labs reviewed.

## 2023-06-25 NOTE — Patient Instructions (Addendum)
Start prednisone 20 mg tablets for the head/sinus congestion. Take 2 tablets by mouth once daily in the morning for 5 days.  Stop by the lab prior to leaving today. I will notify you of your results once received.   You will either be contacted via phone regarding your referral to GI, or you may receive a letter on your MyChart portal from our referral team with instructions for scheduling an appointment. Please let us know if you have not been contacted by anyone within two weeks.  Come by the week of October 20th to complete your chest xray.  It was a pleasure to see you today!

## 2023-06-30 ENCOUNTER — Encounter: Payer: Self-pay | Admitting: Oncology

## 2023-06-30 ENCOUNTER — Inpatient Hospital Stay: Payer: PRIVATE HEALTH INSURANCE | Attending: Oncology | Admitting: Oncology

## 2023-06-30 DIAGNOSIS — N920 Excessive and frequent menstruation with regular cycle: Secondary | ICD-10-CM | POA: Insufficient documentation

## 2023-06-30 DIAGNOSIS — D259 Leiomyoma of uterus, unspecified: Secondary | ICD-10-CM | POA: Insufficient documentation

## 2023-06-30 DIAGNOSIS — D509 Iron deficiency anemia, unspecified: Secondary | ICD-10-CM | POA: Diagnosis not present

## 2023-06-30 NOTE — Progress Notes (Signed)
[facility]  Telephone:([phone]) [phone] Fax:(336) [phone]  ID: Virginia Bird OB: August 18, 1981  MR#: [id number]  MWN#:[id number]  Patient Care Team: Doreene Nest, NP as PCP - General (Internal Medicine) Tereso Newcomer, MD as Attending Physician (Obstetrics and Gynecology)  I connected with Virginia Bird on 06/30/23 at 10:00 AM EDT by video enabled telemedicine visit and verified that I am speaking with the correct person using two identifiers.   I discussed the limitations, risks, security and privacy concerns of performing an evaluation and management service by telemedicine and the availability of in-person appointments. I also discussed with the patient that there may be a patient responsible charge related to this service. The patient expressed understanding and agreed to proceed.   Other persons participating in the visit and their role in the encounter: Patient, MD.  Patient's location: Home. Provider's location: Clinic.  CHIEF COMPLAINT: Iron deficiency anemia.  INTERVAL HISTORY: Patient agreed to video assisted telemedicine visit for further evaluation and consideration of additional IV iron.  She has admitted to the hospital in September 2024 for nearly a week.  She has residual weakness, but feels nearly back to her baseline. She has no neurologic complaints.  She denies any recent fevers. She has a good appetite and denies weight loss.  She has no chest pain, shortness of breath, cough, or hemoptysis.  She denies any nausea, vomiting, constipation, or diarrhea.  She has no melena or hematochezia.  She has no urinary complaints.  Patient offers no further specific complaints today.  REVIEW OF SYSTEMS:   Review of Systems  Constitutional:  Positive for malaise/fatigue. Negative for fever and weight loss.  Respiratory: Negative.  Negative for cough and shortness of breath.   Cardiovascular: Negative.  Negative for chest pain and leg swelling.   Gastrointestinal: Negative.  Negative for abdominal pain, blood in stool and melena.  Genitourinary: Negative.  Negative for hematuria.  Musculoskeletal: Negative.  Negative for back pain.  Skin: Negative.  Negative for rash.  Neurological:  Positive for weakness. Negative for dizziness, focal weakness and headaches.  Psychiatric/Behavioral: Negative.  The patient is not nervous/anxious.     As per HPI. Otherwise, a complete review of systems is negative.  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   HEMORRHOID SURGERY N/A 11/08/2021   Procedure: HEMORRHOIDECTOMY;  Surgeon: Campbell Lerner, MD;  Location: [facility] ORS;  Service: General;  Laterality: N/A;   RECTAL EXAM UNDER ANESTHESIA N/A 11/08/2021   Procedure: RECTAL EXAM UNDER ANESTHESIA;  Surgeon: Campbell Lerner, MD;  Location: [facility] ORS;  Service: General;  Laterality: N/A;   WISDOM TOOTH EXTRACTION      FAMILY HISTORY: Family History  Problem Relation Age of Onset   Thyroid disease Mother    Hypertension Mother    Cancer Father        leukemia   Stroke Father    Cancer Maternal Aunt        Breast Cancer   Breast cancer Maternal Aunt    Asthma Sister    Miscarriages / Stillbirths Sister     ADVANCED  DIRECTIVES (Y/N):  N  HEALTH MAINTENANCE: Social History   Tobacco Use   Smoking status: Every Day    Current packs/day: 0.25    Average packs/day: 0.3 packs/day for 10.0 years (2.5 ttl pk-yrs)    Types: Cigarettes   Smokeless tobacco: Never   Tobacco comments:    Has not tried any quit aids/tn  Vaping Use   Vaping status: Never Used  Substance  Use Topics   Alcohol use: Yes    Comment: daily cocktail   Drug use: No     Colonoscopy:  PAP:  Bone density:  Lipid panel:  No Known Allergies  Current Outpatient Medications  Medication Sig Dispense Refill   EPINEPHrine 0.3 mg/0.3 mL IJ SOAJ injection Inject 0.3 mg into the muscle as needed for anaphylaxis. 1 each 0   famotidine (PEPCID) 20 MG tablet TAKE 1 TABLET (20 MG TOTAL) BY MOUTH DAILY. FOR HEARTBURN. 90 tablet 2   fluticasone (FLONASE) 50 MCG/ACT nasal spray Place 1 spray into both nostrils 2 (two) times daily as needed for allergies or rhinitis. 48 mL 0   naproxen (NAPROSYN) 500 MG tablet Take 1 tablet (500 mg total) by mouth 2 (two) times daily with a meal. As needed for cramping (Patient taking differently: Take 500 mg by mouth daily as needed (for cramping).) 60 tablet 1   No current facility-administered medications for this visit.    OBJECTIVE: There were no vitals filed for this visit.    There is no height or weight on file to calculate BMI.    ECOG FS:0 - Asymptomatic  General: Well-developed, well-nourished, no acute distress. HEENT: Normocephalic. Neuro: Alert, answering all questions appropriately. Cranial nerves grossly intact. Psych: Normal affect.  LAB RESULTS:  Lab Results  Component Value Date   NA 138 06/25/2023   K 3.7 06/25/2023   CL 103 06/25/2023   CO2 28 06/25/2023   GLUCOSE 84 06/25/2023   BUN 6 06/25/2023   CREATININE 0.58 06/25/2023   CALCIUM 9.1 06/25/2023   PROT 5.7 (L) 06/05/2023   ALBUMIN 2.2 (L) 06/05/2023   AST 64 (H) 06/05/2023   ALT 35 06/05/2023   ALKPHOS 105 06/05/2023   BILITOT 0.6 06/05/2023   GFRNONAA >60 06/08/2023   GFRAA  04/11/2009    >60        The eGFR has been calculated using the MDRD equation. This calculation has not been validated in all clinical situations. eGFR's persistently <60 mL/min signify possible Chronic Kidney Disease.    Lab Results  Component Value Date   WBC 4.4 06/25/2023    NEUTROABS 2.7 06/25/2023   HGB 10.8 (L) 06/25/2023   HCT 34.8 (L) 06/25/2023   MCV 79.9 06/25/2023   PLT 323.0 06/25/2023   Lab Results  Component Value Date   IRON 15 (L) 01/15/2023   TIBC 365.4 01/15/2023   IRONPCTSAT 4.1 (L) 01/15/2023   Lab Results  Component Value Date   FERRITIN 6.1 (L) 01/15/2023     STUDIES: DG CHEST PORT 1 VIEW  Result Date: 06/07/2023 CLINICAL DATA:  [id number] Fever [id number] EXAM: PORTABLE CHEST 1 VIEW COMPARISON:  May 31, 2023 FINDINGS: The cardiomediastinal silhouette is unchanged in contour. No pleural effusion. No pneumothorax. Increased bilateral reticulonodular opacities with peribronchial cuffing. Patchy LEFT retrocardiac heterogeneous opacities, increased. IMPRESSION: 1. Increased bilateral reticulonodular opacities with peribronchial cuffing. Findings are favored to reflect atypical infection. Differential considerations include pulmonary edema. 2. Increased patchy LEFT retrocardiac opacities. Electronically Signed   By: Meda Klinefelter M.D.  On: 06/07/2023 17:18   CT ABDOMEN PELVIS W CONTRAST  Result Date: 06/04/2023 CLINICAL DATA:  Abdominal pain EXAM: CT ABDOMEN AND PELVIS WITH CONTRAST TECHNIQUE: Multidetector CT imaging of the abdomen and pelvis was performed using the standard protocol following bolus administration of intravenous contrast. RADIATION DOSE REDUCTION: This exam was performed according to the departmental dose-optimization program which includes automated exposure control, adjustment of the mA and/or kV according to patient size and/or use of iterative reconstruction technique. CONTRAST:  75mL OMNIPAQUE IOHEXOL 350 MG/ML SOLN COMPARISON:  CT abdomen and pelvis 06/01/2023 FINDINGS: Lower chest: There are small bilateral pleural effusions with compressive atelectasis in the bilateral lower lobes. Additionally, there are patchy airspace and ground-glass opacities in the bilateral lower lobes and right middle lobe. These findings are  new. Hepatobiliary: No focal liver abnormality is seen. No gallstones, gallbladder wall thickening, or biliary dilatation. Pancreas: Unremarkable. No pancreatic ductal dilatation or surrounding inflammatory changes. Spleen: Normal in size without focal abnormality. Adrenals/Urinary Tract: Adrenal glands are unremarkable. Kidneys are normal, without renal calculi, focal lesion, or hydronephrosis. Bladder is unremarkable. Stomach/Bowel: There is circumferential wall thickening inflammatory stranding involving the terminal ileum similar to prior. No definite perforation or abscess identified. The appendix is within normal limits. There is no bowel obstruction. Stomach is within normal limits. Vascular/Lymphatic: No significant vascular findings are present. No enlarged abdominal or pelvic lymph nodes. Reproductive: Lobulated enlarged heterogeneous uterus is again noted compatible with fibroid change. The largest fibroid is on the right measuring 7.8 cm. Left ovarian/adnexal low-attenuation area is rounded measuring 3.8 cm, unchanged from prior, possibly a cyst or complex cyst. Dilated fluid-filled tubular structure in the right adnexa may represent hydrosalpinx similar to the prior study. Other: There is no ascites or focal abdominal wall hernia. There is new mild body wall edema. There is subcutaneous air in the right anterior abdominal wall likely related to medication injection site. Musculoskeletal: No acute or significant osseous findings. IMPRESSION: 1. Stable wall thickening and inflammation of the terminal ileum compatible with ileitis. No bowel obstruction or abscess. 2. New small bilateral pleural effusions with compressive atelectasis. 3. New patchy airspace and ground-glass opacities in the bilateral lower lobes and right middle lobe concerning for multifocal pneumonia. 4. New mild body wall edema. 5. Fibroid uterus. 6. Stable left ovarian/adnexal cystic lesion. This can be followed up/further evaluated  with pelvic ultrasound. 7. Stable dilated fluid-filled tubular structure in the right adnexa may represent hydrosalpinx. This can be further evaluated with pelvic ultrasound. Electronically Signed   By: Darliss Cheney M.D.   On: 06/04/2023 16:57   CT ABDOMEN PELVIS W CONTRAST  Result Date: 06/01/2023 CLINICAL DATA:  Sepsis.  Acute abdominal pain EXAM: CT ABDOMEN AND PELVIS WITH CONTRAST TECHNIQUE: Multidetector CT imaging of the abdomen and pelvis was performed using the standard protocol following bolus administration of intravenous contrast. RADIATION DOSE REDUCTION: This exam was performed according to the departmental dose-optimization program which includes automated exposure control, adjustment of the mA and/or kV according to patient size and/or use of iterative reconstruction technique. CONTRAST:  75mL OMNIPAQUE IOHEXOL 350 MG/ML SOLN COMPARISON:  01/17/2009 FINDINGS: Lower chest: Streaky density at the lung bases attributed atelectasis. Hepatobiliary: No focal liver abnormality.No evidence of biliary obstruction or stone. Pancreas: Unremarkable. Spleen: Unremarkable. Adrenals/Urinary Tract: Negative adrenals. No hydronephrosis or stone. Unremarkable bladder. Stomach/Bowel: Thick walled terminal ileum with wall thickening and enhancement greatest just before the ileocecal valve. More proximally there is low-density submucosal expansion. The regional mesentery is edematous and there is  ileocolic mild lymph node enlargement without cavitation. Small volume regional peritoneal fluid Vascular/Lymphatic: No acute vascular abnormality. No mass or adenopathy. Reproductive:Enlarged uterus with multiple myometrial masses, the largest in the right body measuring nearly 9 cm. Dominant follicle in the left ovary. Other: No generalized ascites or pneumoperitoneum. Musculoskeletal: No acute abnormalities. IMPRESSION: 1. Thickening of the terminal ileum which could be inflammatory bowel disease or infectious enteritis.  Greater mural enhancement with some indistinctness of wall just before the ileocecal valve, suggest follow-up imaging to exclude mass (not favored) or developing wall breakdown/penetrating disease. 2. Enlarged and fibroid uterus. Electronically Signed   By: Tiburcio Pea M.D.   On: 06/01/2023 04:23   DG Chest Port 1 View  Result Date: 05/31/2023 CLINICAL DATA:  Sinus infection.  Self medicating.  Fever. EXAM: PORTABLE CHEST 1 VIEW COMPARISON:  None Available. FINDINGS: Normal cardiomediastinal silhouette. No focal consolidation, pleural effusion, or pneumothorax. No displaced rib fractures. IMPRESSION: No acute cardiopulmonary disease. Electronically Signed   By: Minerva Fester M.D.   On: 05/31/2023 23:45    ASSESSMENT: Iron deficiency anemia.  PLAN:    Iron deficiency anemia: Likely secondary to fibroids and heavy menstrual bleeding.  Patient's hemoglobin is decreased, but stable at 10.8.  She last received IV Venofer on Feb 13, 2023.  Patient will return later this week for laboratory work including a full iron panel.  If decreased, will schedule for additional IV iron.  If her iron panel is within normal limits we will arrange follow-up in approximately 4 months.  Heavy menses: By report patient has fibroids. Continue follow-up with OB/GYN as scheduled.  I provided 20 minutes of face-to-face video visit time during this encounter which included chart review, counseling, and coordination of care as documented above.    Patient expressed understanding and was in agreement with this plan. She also understands that She can call clinic at any time with any questions, concerns, or complaints.    Jeralyn Ruths, MD   06/30/2023 10:24 AM

## 2023-07-02 ENCOUNTER — Inpatient Hospital Stay: Payer: PRIVATE HEALTH INSURANCE

## 2023-07-02 DIAGNOSIS — D259 Leiomyoma of uterus, unspecified: Secondary | ICD-10-CM | POA: Diagnosis not present

## 2023-07-02 DIAGNOSIS — N920 Excessive and frequent menstruation with regular cycle: Secondary | ICD-10-CM | POA: Diagnosis present

## 2023-07-02 DIAGNOSIS — D509 Iron deficiency anemia, unspecified: Secondary | ICD-10-CM

## 2023-07-02 LAB — CBC WITH DIFFERENTIAL/PLATELET
Abs Immature Granulocytes: 0.06 10*3/uL (ref 0.00–0.07)
Basophils Absolute: 0 10*3/uL (ref 0.0–0.1)
Basophils Relative: 1 %
Eosinophils Absolute: 0.3 10*3/uL (ref 0.0–0.5)
Eosinophils Relative: 4 %
HCT: 34.6 % — ABNORMAL LOW (ref 36.0–46.0)
Hemoglobin: 10.8 g/dL — ABNORMAL LOW (ref 12.0–15.0)
Immature Granulocytes: 1 %
Lymphocytes Relative: 26 %
Lymphs Abs: 1.8 10*3/uL (ref 0.7–4.0)
MCH: 24.9 pg — ABNORMAL LOW (ref 26.0–34.0)
MCHC: 31.2 g/dL (ref 30.0–36.0)
MCV: 79.9 fL — ABNORMAL LOW (ref 80.0–100.0)
Monocytes Absolute: 0.5 10*3/uL (ref 0.1–1.0)
Monocytes Relative: 8 %
Neutro Abs: 4.4 10*3/uL (ref 1.7–7.7)
Neutrophils Relative %: 60 %
Platelets: 391 10*3/uL (ref 150–400)
RBC: 4.33 MIL/uL (ref 3.87–5.11)
RDW: 14.6 % (ref 11.5–15.5)
WBC: 7.1 10*3/uL (ref 4.0–10.5)
nRBC: 0 % (ref 0.0–0.2)

## 2023-07-02 LAB — IRON AND TIBC
Iron: 43 ug/dL (ref 28–170)
Saturation Ratios: 13 % (ref 10.4–31.8)
TIBC: 321 ug/dL (ref 250–450)
UIBC: 278 ug/dL

## 2023-07-02 LAB — FERRITIN: Ferritin: 9 ng/mL — ABNORMAL LOW (ref 11–307)

## 2023-07-03 ENCOUNTER — Encounter: Payer: Self-pay | Admitting: *Deleted

## 2023-08-11 ENCOUNTER — Telehealth: Payer: Self-pay | Admitting: Gastroenterology

## 2023-08-11 NOTE — Telephone Encounter (Signed)
The patient called back. I inform her that I was calling because a referral that was sent over for her to see Dr. Allegra Lai. She stated that she is not able to keep taking a lot of time from work. The patient asked if she could schedule a Mychart visit. I asked Morrie Sheldon Dr. Allegra Lai nurse per Morrie Sheldon the patient needs to come in the office. I inform the patient she will need to come in and Dr. Allegra Lai only do afternoon appointments. The patient wanted the latest appointment Dr.Vanga had and I let her know that it was 2:15 pm the patient said that she is not able to do that because of her work schedule. The patient said that she can an early appointment or very late, so she doesn't have to miss a lot of work. The patient that she will call back because she is unable to work with Dr. Allegra Lai schedule at this time. I asked Morrie Sheldon can the patient see Inetta Fermo. I called the patient back and scheduled her with Inetta Fermo on 09/25/23 8:15 am.

## 2023-09-06 ENCOUNTER — Other Ambulatory Visit: Payer: Self-pay | Admitting: Primary Care

## 2023-09-06 DIAGNOSIS — J3089 Other allergic rhinitis: Secondary | ICD-10-CM

## 2023-09-12 ENCOUNTER — Telehealth: Payer: PRIVATE HEALTH INSURANCE | Admitting: Physician Assistant

## 2023-09-12 DIAGNOSIS — J019 Acute sinusitis, unspecified: Secondary | ICD-10-CM

## 2023-09-12 DIAGNOSIS — B9689 Other specified bacterial agents as the cause of diseases classified elsewhere: Secondary | ICD-10-CM

## 2023-09-12 MED ORDER — AZELASTINE HCL 0.1 % NA SOLN: 2.0000 | Freq: Two times a day (BID) | NASAL | 12 refills | Status: AC

## 2023-09-12 MED ORDER — DOXYCYCLINE HYCLATE 100 MG PO TABS
100.0000 mg | ORAL_TABLET | Freq: Two times a day (BID) | ORAL | 0 refills | Status: AC
Start: 2023-09-12 — End: 2023-09-19

## 2023-09-12 NOTE — Progress Notes (Signed)

## 2023-09-12 NOTE — Progress Notes (Signed)
I have spent 5 minutes in review of e-visit questionnaire, review and updating patient chart, medical decision making and response to patient.   Laure Kidney, PA-C

## 2023-09-21 ENCOUNTER — Encounter: Payer: Self-pay | Admitting: Oncology

## 2023-09-25 ENCOUNTER — Ambulatory Visit: Payer: PRIVATE HEALTH INSURANCE | Admitting: Physician Assistant

## 2023-10-06 ENCOUNTER — Encounter: Payer: Self-pay | Admitting: Oncology

## 2023-10-06 ENCOUNTER — Inpatient Hospital Stay: Payer: PRIVATE HEALTH INSURANCE

## 2023-10-08 ENCOUNTER — Inpatient Hospital Stay: Payer: PRIVATE HEALTH INSURANCE

## 2023-10-08 ENCOUNTER — Inpatient Hospital Stay: Payer: PRIVATE HEALTH INSURANCE | Admitting: Oncology

## 2023-10-20 ENCOUNTER — Encounter: Payer: Self-pay | Admitting: Oncology

## 2023-10-26 ENCOUNTER — Other Ambulatory Visit: Payer: Self-pay | Admitting: Primary Care

## 2023-10-26 DIAGNOSIS — Z1231 Encounter for screening mammogram for malignant neoplasm of breast: Secondary | ICD-10-CM

## 2023-11-02 ENCOUNTER — Encounter: Payer: Self-pay | Admitting: Physician Assistant

## 2023-11-02 ENCOUNTER — Telehealth: Payer: Self-pay

## 2023-11-02 ENCOUNTER — Ambulatory Visit (INDEPENDENT_AMBULATORY_CARE_PROVIDER_SITE_OTHER): Payer: PRIVATE HEALTH INSURANCE | Admitting: Physician Assistant

## 2023-11-02 VITALS — BP 122/79 | HR 98 | Temp 98.2°F | Ht 62.0 in | Wt 155.8 lb

## 2023-11-02 DIAGNOSIS — D509 Iron deficiency anemia, unspecified: Secondary | ICD-10-CM | POA: Diagnosis not present

## 2023-11-02 DIAGNOSIS — R933 Abnormal findings on diagnostic imaging of other parts of digestive tract: Secondary | ICD-10-CM | POA: Diagnosis not present

## 2023-11-02 DIAGNOSIS — Z8719 Personal history of other diseases of the digestive system: Secondary | ICD-10-CM | POA: Diagnosis not present

## 2023-11-02 DIAGNOSIS — D500 Iron deficiency anemia secondary to blood loss (chronic): Secondary | ICD-10-CM

## 2023-11-02 MED ORDER — NA SULFATE-K SULFATE-MG SULF 17.5-3.13-1.6 GM/177ML PO SOLN: 1.0000 | Freq: Once | ORAL | 0 refills | Status: AC

## 2023-11-02 NOTE — Progress Notes (Signed)
 Celso Amy, PA-C [address]  [address]  [place], [place] [address]  Main: [phone]  Fax: [phone]   Primary Care Physician: Doreene Nest, NP  Primary Gastroenterologist:  Celso Amy, PA-C / Dr. Lannette Donath    CC: F/U Infectious enteritits, iron deficiency anemia  HPI: FRANCY MCILVAINE is a 43 y.o. female presents for hospital follow-up of infectious enteritis.  She was hospitalized at [facility] 05/31/2023 until 06/08/2023 for infectious enteritis, iron deficiency anemia, dysfunctional uterine bleeding, menorrhagia, severe sepsis, hypokalemia, and bilateral pneumonia.  Presented with nausea, vomiting, and diarrhea.  CT showed thickened terminal ileum consistent with IBD versus infectious enteritis.  Treated with ceftriaxone and metronidazole.  Was switched to Zosyn.  GI symptoms resolved.  Outpatient colonoscopy was recommended for follow-up.  Most recent labs 07/02/2023 showed improved stable hemoglobin 10.8, normal white count 7.1, MCV 79.  Low ferritin 9.  Iron 43, iron saturation 13%.  She has received IV iron infusions through hematology.  She had surgical treatment for hemorrhoids and anal fissure 10/2021.  She underwent internal hemorrhoid banding by Dr. Allegra Lai in 2022.  No previous colonoscopy or EGD.  She has no current GI symptoms.  GI symptoms resolved after her hospitalization 05/2023.  Currently she is having normal formed soft bowel movement twice daily.  She denies abdominal pain, diarrhea, constipation, rectal pain, or rectal bleeding.  She admits to menorrhagia attributed to uterine fibroids and is followed by GYN.   Current Outpatient Medications  Medication Sig Dispense Refill   azelastine (ASTELIN) 0.1 % nasal spray Place 2 sprays into both nostrils 2 (two) times daily. Use in each nostril as directed 30 mL 12   EPINEPHrine 0.3 mg/0.3 mL IJ SOAJ injection Inject 0.3 mg into the muscle as needed for anaphylaxis. 1 each 0   famotidine (PEPCID) 20 MG  tablet TAKE 1 TABLET (20 MG TOTAL) BY MOUTH DAILY. FOR HEARTBURN. 90 tablet 2   fluticasone (FLONASE) 50 MCG/ACT nasal spray PLACE 1 SPRAY INTO BOTH NOSTRILS 2 (TWO) TIMES DAILY AS NEEDED FOR ALLERGIES OR RHINITIS. 48 mL 0   Na Sulfate-K Sulfate-Mg Sulfate concentrate 17.5-3.13-1.6 GM/177ML SOLN Take 1 Box by mouth once for 1 dose. At 5 PM the day before your procedure pour the contents of one bottle of Suprep into the mixing container provided.  Fill the container, with ice cold water, up to the 16 oz fill line, and drink the entire amount. Then 5 hours before procedure pour the contents of the second bottle of Suprep into the mixing container provided and follow the same instructions. 354 mL 0   naproxen (NAPROSYN) 500 MG tablet Take 1 tablet (500 mg total) by mouth 2 (two) times daily with a meal. As needed for cramping (Patient taking differently: Take 500 mg by mouth daily as needed (for cramping).) 60 tablet 1   No current facility-administered medications for this visit.    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   Physical Examination:   BP 122/79   Pulse 98    Temp 98.2 F (36.8 C)   Ht 5\' 2"  (1.575 m)   Wt 155 lb 12.8 oz (70.7 kg)   LMP 10/31/2023   BMI 28.50 kg/m   General: Well-nourished, well-developed in no acute distress.  Lungs: Clear to auscultation bilaterally. Non-labored. Heart: Regular rate and rhythm, no murmurs rubs or gallops.  Abdomen: Bowel sounds are normal; Abdomen is Soft and obese; No hepatosplenomegaly, masses or hernias;  Mild generalized Abdominal Tenderness (pt. Attributes to being on menstrual cycle); No guarding or rebound tenderness. Neuro: Alert and oriented x 3.  Grossly intact.  Psych: Alert and cooperative, normal mood and affect.   Imaging Studies: No results found.  Assessment and Plan:   VICCI REDER is a 43 y.o. y/o female returns for follow-up of:  1.  Acute infectious enteritis requiring hospitalization 05/2023 -symptoms currently resolved. 2.  Abnormal CT scan terminal ileitis (05/2023) 3.  Iron deficiency anemia; has received IV iron infusions 4.  History of menorrhagia 5.  History of hemorrhoids and anal fissure s/p banding and surgical repair -currently asymptomatic.  Plan: -Labs CBC, iron panel, ferritin, celiac panel -Not currently on oral iron.  If iron is low, then refer back to hematologist for IV iron infusion. -Scheduling EGD & Colonoscopy I discussed risks of EGD and colonoscopy with patient to include risk of bleeding, perforation, and risk of sedation.  Patient expressed understanding and agrees to proceed with procedures.     Celso Amy, PA-C  Follow up based on lab and procedure results.

## 2023-11-02 NOTE — Telephone Encounter (Signed)
 Pending review information given to [facility] [phone] Per [employer] if pt call and give them the procedure codes pt is eligible for insurance incentive

## 2023-11-03 ENCOUNTER — Telehealth: Payer: Self-pay

## 2023-11-03 ENCOUNTER — Encounter: Payer: Self-pay | Admitting: Physician Assistant

## 2023-11-03 DIAGNOSIS — D500 Iron deficiency anemia secondary to blood loss (chronic): Secondary | ICD-10-CM

## 2023-11-03 LAB — CBC WITH DIFFERENTIAL/PLATELET
Basophils Absolute: 0 10*3/uL (ref 0.0–0.2)
Basos: 1 %
EOS (ABSOLUTE): 0.3 10*3/uL (ref 0.0–0.4)
Eos: 5 %
Hematocrit: 31.7 % — ABNORMAL LOW (ref 34.0–46.6)
Hemoglobin: 9.6 g/dL — ABNORMAL LOW (ref 11.1–15.9)
Immature Grans (Abs): 0 10*3/uL (ref 0.0–0.1)
Immature Granulocytes: 0 %
Lymphocytes Absolute: 1.2 10*3/uL (ref 0.7–3.1)
Lymphs: 19 %
MCH: 23.1 pg — ABNORMAL LOW (ref 26.6–33.0)
MCHC: 30.3 g/dL — ABNORMAL LOW (ref 31.5–35.7)
MCV: 76 fL — ABNORMAL LOW (ref 79–97)
Monocytes Absolute: 0.3 10*3/uL (ref 0.1–0.9)
Monocytes: 5 %
Neutrophils Absolute: 4.5 10*3/uL (ref 1.4–7.0)
Neutrophils: 70 %
Platelets: 375 10*3/uL (ref 150–450)
RBC: 4.15 x10E6/uL (ref 3.77–5.28)
RDW: 15.3 % (ref 11.7–15.4)
WBC: 6.5 10*3/uL (ref 3.4–10.8)

## 2023-11-03 LAB — IRON,TIBC AND FERRITIN PANEL
Ferritin: 9 ng/mL — ABNORMAL LOW (ref 15–150)
Iron Saturation: 5 % — CL (ref 15–55)
Iron: 20 ug/dL — ABNORMAL LOW (ref 27–159)
Total Iron Binding Capacity: 396 ug/dL (ref 250–450)
UIBC: 376 ug/dL (ref 131–425)

## 2023-11-03 LAB — CELIAC DISEASE AB SCREEN W/RFX
Antigliadin Abs, IgA: 14 U (ref 0–19)
IgA/Immunoglobulin A, Serum: 193 mg/dL (ref 87–352)
Transglutaminase IgA: <2 U/mL (ref 0–3)

## 2023-11-03 NOTE — Telephone Encounter (Signed)
 Left message to call office- Referral to hematology placed -   Hi Virginia Bird, your labs show: 1.  Hemoglobin has dropped a little from 10.8-9.6 in the past 4 months.  Iron levels are also low.  We need to refer you back to hematology for IV iron infusions. 2.  Still waiting on celiac lab results. Celso Amy, PA-C

## 2023-11-17 ENCOUNTER — Ambulatory Visit: Payer: PRIVATE HEALTH INSURANCE | Admitting: Oncology

## 2023-11-17 ENCOUNTER — Inpatient Hospital Stay: Payer: PRIVATE HEALTH INSURANCE | Admitting: Nurse Practitioner

## 2023-11-17 ENCOUNTER — Other Ambulatory Visit: Payer: Self-pay | Admitting: Primary Care

## 2023-11-17 ENCOUNTER — Encounter: Payer: Self-pay | Admitting: Nurse Practitioner

## 2023-11-17 ENCOUNTER — Inpatient Hospital Stay: Payer: PRIVATE HEALTH INSURANCE | Attending: Oncology

## 2023-11-17 VITALS — BP 121/75

## 2023-11-17 VITALS — BP 129/84 | Temp 97.5°F | Resp 16 | Wt 155.0 lb

## 2023-11-17 DIAGNOSIS — D219 Benign neoplasm of connective and other soft tissue, unspecified: Secondary | ICD-10-CM

## 2023-11-17 DIAGNOSIS — D509 Iron deficiency anemia, unspecified: Secondary | ICD-10-CM | POA: Insufficient documentation

## 2023-11-17 DIAGNOSIS — J3489 Other specified disorders of nose and nasal sinuses: Secondary | ICD-10-CM

## 2023-11-17 DIAGNOSIS — D500 Iron deficiency anemia secondary to blood loss (chronic): Secondary | ICD-10-CM | POA: Diagnosis not present

## 2023-11-17 MED ORDER — IRON SUCROSE 20 MG/ML IV SOLN
200.0000 mg | Freq: Once | INTRAVENOUS | Status: AC
  Administered 2023-11-17: 200 mg via INTRAVENOUS

## 2023-11-17 NOTE — Patient Instructions (Signed)

## 2023-11-17 NOTE — Progress Notes (Signed)
 [facility]  [phone] [phone]  ID: Virginia Bird OB: 1980-12-05  MR#: [id number]  AOZ#:[id number]  Patient Care Team: Doreene Nest, NP as PCP - General (Internal Medicine) Tereso Newcomer, MD as Attending Physician (Obstetrics and Gynecology) Jeralyn Ruths, MD as Consulting Physician (Oncology)  CHIEF COMPLAINT: Iron deficiency anemia  INTERVAL HISTORY: Patient returns to clinic for follow up and consideration of IV iron. She feels fatigued. Hasseen GI with plan for colonoscopy and EGD. She also admits to menorrhagia secondary to uterine fibroids. At that time, iron levels were found to be low and she was referred back to hematology. She has no neurologic complaints.  She denies any recent fevers. She has a good appetite and denies weight loss.  She has no chest pain, shortness of breath, cough, or hemoptysis.  She denies any nausea, vomiting, constipation, or diarrhea.  She has no melena or hematochezia.  She has no urinary complaints.  Patient offers no further specific complaints today.  REVIEW OF SYSTEMS:   Review of Systems  Constitutional:  Positive for malaise/fatigue. Negative for fever and weight loss.  Respiratory: Negative.  Negative for cough and shortness of breath.   Cardiovascular: Negative.  Negative for chest pain and leg swelling.  Gastrointestinal: Negative.  Negative for abdominal pain, blood in stool and melena.  Genitourinary: Negative.  Negative for hematuria.  Musculoskeletal: Negative.  Negative for back pain.  Skin: Negative.  Negative for rash.  Neurological:  Positive for weakness. Negative for dizziness, focal weakness and headaches.  Psychiatric/Behavioral: Negative.  The patient is not nervous/anxious.     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   HEMORRHOID SURGERY N/A 11/08/2021   Procedure: HEMORRHOIDECTOMY;  Surgeon: Campbell Lerner, MD;  Location: [facility] ORS;  Service: General;  Laterality: N/A;   RECTAL EXAM UNDER ANESTHESIA N/A 11/08/2021   Procedure: RECTAL EXAM UNDER ANESTHESIA;  Surgeon: Campbell Lerner, MD;  Location: [facility] ORS;  Service: General;  Laterality: N/A;   WISDOM TOOTH EXTRACTION      FAMILY HISTORY: Family History  Problem Relation Age of Onset   Thyroid disease Mother    Hypertension Mother    Cancer Father        leukemia   Stroke Father    Cancer Maternal Aunt        Breast Cancer   Breast cancer Maternal Aunt    Asthma Sister    Miscarriages / Stillbirths Sister     ADVANCED DIRECTIVES (Y/N):  N  HEALTH MAINTENANCE: Social History   Tobacco Use   Smoking status: Every Day    Current packs/day: 0.25    Average packs/day: 0.3 packs/day for 10.0 years (2.5 ttl pk-yrs)    Types: Cigarettes   Smokeless tobacco: Never   Tobacco comments:    Has not tried any quit aids/tn  Vaping Use   Vaping status: Never Used  Substance Use Topics   Alcohol use: Yes    Comment: daily cocktail   Drug use: No     Colonoscopy:  PAP:  Bone density:  Lipid panel:  No Known Allergies  Current Outpatient Medications  Medication Sig Dispense Refill   azelastine (ASTELIN) 0.1 % nasal spray Place 2 sprays into both nostrils 2 (two) times daily. Use in each nostril as directed 30 mL 12   EPINEPHrine 0.3 mg/0.3 mL IJ SOAJ injection Inject 0.3 mg into the muscle as needed for anaphylaxis. 1 each 0   famotidine (PEPCID) 20 MG tablet TAKE 1 TABLET (20 MG TOTAL) BY MOUTH DAILY. FOR HEARTBURN. 90 tablet 2   fluticasone (FLONASE) 50 MCG/ACT nasal spray PLACE 1 SPRAY INTO BOTH NOSTRILS 2  (TWO) TIMES DAILY AS NEEDED FOR ALLERGIES OR RHINITIS. 48 mL 0   naproxen (NAPROSYN) 500 MG tablet Take 1 tablet (500 mg total) by mouth 2 (two) times daily with a meal. As needed for cramping (Patient taking differently: Take 500 mg by mouth daily as needed (for cramping).) 60 tablet 1   No current facility-administered medications for this visit.    OBJECTIVE: Vitals:   11/17/23 1533  BP: 129/84  Resp: 16  Temp: (!) 97.5 F (36.4 C)  SpO2: 100%      Body mass index is 28.35 kg/m.    ECOG FS:0 - Asymptomatic  General: Well-developed, well-nourished, no acute distress. Eyes: Pink conjunctiva, anicteric sclera. Lungs: Clear to auscultation bilaterally.  No audible wheezing or coughing Heart: Regular rate and rhythm.  Abdomen: Soft, nontender, nondistended.  Musculoskeletal: No edema, cyanosis, or clubbing. Neuro: Alert, answering all questions appropriately. Cranial nerves grossly intact. Skin: No rashes or petechiae noted. Psych: Normal affect.   LAB RESULTS:  Lab Results  Component Value Date   NA 138 06/25/2023   K 3.7 06/25/2023   CL 103 06/25/2023   CO2 28 06/25/2023   GLUCOSE 84 06/25/2023   BUN 6 06/25/2023   CREATININE 0.58 06/25/2023   CALCIUM 9.1 06/25/2023   PROT 5.7 (L) 06/05/2023   ALBUMIN 2.2 (L) 06/05/2023   AST 64 (H) 06/05/2023   ALT 35 06/05/2023   ALKPHOS 105 06/05/2023   BILITOT 0.6 06/05/2023   GFRNONAA >60 06/08/2023   GFRAA  04/11/2009    >60        The eGFR has been calculated using the MDRD equation. This calculation has not been validated in all clinical situations. eGFR's persistently <60 mL/min signify possible Chronic Kidney Disease.    Lab Results  Component Value Date   WBC 6.5 11/02/2023   NEUTROABS 4.5 11/02/2023   HGB 9.6 (L) 11/02/2023   HCT 31.7 (L) 11/02/2023   MCV 76 (L) 11/02/2023   PLT 375 11/02/2023   Lab Results  Component Value Date   IRON 20 (L) 11/02/2023   TIBC 396 11/02/2023   IRONPCTSAT 5 (LL)  11/02/2023   Lab Results  Component Value Date   FERRITIN 9 (L) 11/02/2023     STUDIES: No results found.  ASSESSMENT: Iron deficiency anemia.  PLAN:    Iron deficiency anemia: thought to be secondary to menorrhagia though GI history may also be contributing. She has upcoming colonoscopy and EGD. Hmg has dropped to 9.6. Ferritin 9, iron sat 5%. Recommend venofer weekly x 5 to replenish iron stores. Patient in agreement. She'll receive first dose today.  Menorrhagia- patient reports hx of fibroids. She has been followed by Dr. Macon Large. Requests referral to [facility].  She has colonoscopy and endoscopy scheduled with DR. Tobi Bastos 3/27.   Disposition:  Venofer today then 4 additional doses weekly for total of 5 5 mo- lab, Dr. Orlie Dakin, +/- venofer- la  Patient expressed  understanding and was in agreement with this plan. She also understands that She can call clinic at any time with any questions, concerns, or complaints.   Alinda Dooms, NP   11/17/2023

## 2023-11-25 ENCOUNTER — Inpatient Hospital Stay: Payer: PRIVATE HEALTH INSURANCE

## 2023-11-25 VITALS — BP 118/78 | HR 87 | Temp 99.0°F | Resp 17

## 2023-11-25 DIAGNOSIS — D509 Iron deficiency anemia, unspecified: Secondary | ICD-10-CM | POA: Diagnosis not present

## 2023-11-25 MED ORDER — SODIUM CHLORIDE 0.9% FLUSH
10.0000 mL | Freq: Once | INTRAVENOUS | Status: AC | PRN
  Administered 2023-11-25: 10 mL
  Filled 2023-11-25: qty 10

## 2023-11-25 MED ORDER — IRON SUCROSE 20 MG/ML IV SOLN
200.0000 mg | Freq: Once | INTRAVENOUS | Status: AC
Start: 2023-11-25 — End: 2023-11-25
  Administered 2023-11-25: 200 mg via INTRAVENOUS

## 2023-11-25 NOTE — Patient Instructions (Signed)

## 2023-11-25 NOTE — Progress Notes (Signed)
Patient tolerated Venofer infusion well. Explained recommendation of 30 min post monitoring. Patient refused to wait post monitoring. Educated on what signs to watch for & to call with any concerns. No questions, discharged. Stable  

## 2023-11-30 ENCOUNTER — Encounter: Payer: Self-pay | Admitting: Obstetrics and Gynecology

## 2023-12-01 ENCOUNTER — Inpatient Hospital Stay: Payer: PRIVATE HEALTH INSURANCE

## 2023-12-01 VITALS — BP 121/72 | HR 86 | Temp 98.2°F | Resp 16

## 2023-12-01 DIAGNOSIS — D509 Iron deficiency anemia, unspecified: Secondary | ICD-10-CM

## 2023-12-01 MED ORDER — IRON SUCROSE 20 MG/ML IV SOLN
200.0000 mg | Freq: Once | INTRAVENOUS | Status: AC
  Administered 2023-12-01: 200 mg via INTRAVENOUS
  Filled 2023-12-01: qty 10

## 2023-12-01 NOTE — Progress Notes (Signed)
 Declined 30 minute post-observation. Aware of risks. Vitals stable at discharge.

## 2023-12-01 NOTE — Patient Instructions (Signed)

## 2023-12-03 ENCOUNTER — Encounter: Payer: Self-pay | Admitting: Oncology

## 2023-12-08 ENCOUNTER — Inpatient Hospital Stay: Payer: PRIVATE HEALTH INSURANCE

## 2023-12-08 VITALS — BP 136/90 | HR 90 | Temp 98.3°F | Resp 18

## 2023-12-08 DIAGNOSIS — D509 Iron deficiency anemia, unspecified: Secondary | ICD-10-CM

## 2023-12-08 MED ORDER — IRON SUCROSE 20 MG/ML IV SOLN
200.0000 mg | Freq: Once | INTRAVENOUS | Status: AC
Start: 2023-12-08 — End: 2023-12-08
  Administered 2023-12-08: 200 mg via INTRAVENOUS
  Filled 2023-12-08: qty 10

## 2023-12-08 MED ORDER — SODIUM CHLORIDE 0.9% FLUSH
10.0000 mL | Freq: Once | INTRAVENOUS | Status: AC | PRN
  Administered 2023-12-08: 10 mL
  Filled 2023-12-08: qty 10

## 2023-12-08 NOTE — Patient Instructions (Signed)

## 2023-12-08 NOTE — Progress Notes (Signed)
 Pt refused to stay for the 30 minute observation after Venofer and pt has been instructed to seek medical assistance with any concerns.

## 2023-12-10 ENCOUNTER — Encounter: Payer: Self-pay | Admitting: Gastroenterology

## 2023-12-10 ENCOUNTER — Ambulatory Visit: Payer: PRIVATE HEALTH INSURANCE | Admitting: Certified Registered"

## 2023-12-10 ENCOUNTER — Ambulatory Visit
Admission: RE | Admit: 2023-12-10 | Discharge: 2023-12-10 | Disposition: A | Discharge status: Home / Self Care | Payer: PRIVATE HEALTH INSURANCE | Source: Ambulatory Visit | Attending: Gastroenterology | Admitting: Gastroenterology

## 2023-12-10 ENCOUNTER — Encounter
Admission: RE | Disposition: A | Discharge status: Home / Self Care | Payer: Self-pay | Source: Ambulatory Visit | Attending: Gastroenterology

## 2023-12-10 ENCOUNTER — Other Ambulatory Visit: Payer: Self-pay

## 2023-12-10 DIAGNOSIS — K219 Gastro-esophageal reflux disease without esophagitis: Secondary | ICD-10-CM | POA: Diagnosis not present

## 2023-12-10 DIAGNOSIS — D500 Iron deficiency anemia secondary to blood loss (chronic): Secondary | ICD-10-CM

## 2023-12-10 DIAGNOSIS — F17210 Nicotine dependence, cigarettes, uncomplicated: Secondary | ICD-10-CM | POA: Diagnosis not present

## 2023-12-10 DIAGNOSIS — D509 Iron deficiency anemia, unspecified: Secondary | ICD-10-CM | POA: Insufficient documentation

## 2023-12-10 HISTORY — PX: ESOPHAGOGASTRODUODENOSCOPY (EGD) WITH PROPOFOL: SHX5813

## 2023-12-10 HISTORY — PX: COLONOSCOPY WITH PROPOFOL: SHX5780

## 2023-12-10 LAB — POCT PREGNANCY, URINE: Preg Test, Ur: NEGATIVE

## 2023-12-10 SURGERY — COLONOSCOPY WITH PROPOFOL
Anesthesia: General

## 2023-12-10 MED ORDER — LIDOCAINE HCL (PF) 2 % IJ SOLN
INTRAMUSCULAR | Status: DC | PRN
Start: 2023-12-10 — End: 2023-12-10
  Administered 2023-12-10: 40 mg via INTRADERMAL

## 2023-12-10 MED ORDER — SODIUM CHLORIDE 0.9 % IV SOLN: INTRAVENOUS | Status: DC

## 2023-12-10 MED ORDER — LIDOCAINE HCL (PF) 2 % IJ SOLN
INTRAMUSCULAR | Status: AC
  Filled 2023-12-10: qty 10

## 2023-12-10 MED ORDER — PROPOFOL 1000 MG/100ML IV EMUL
INTRAVENOUS | Status: AC
  Filled 2023-12-10: qty 100

## 2023-12-10 MED ORDER — PROPOFOL 10 MG/ML IV BOLUS
INTRAVENOUS | Status: DC | PRN
  Administered 2023-12-10: 20 mg via INTRAVENOUS
  Administered 2023-12-10: 30 mg via INTRAVENOUS
  Administered 2023-12-10 (×6): 20 mg via INTRAVENOUS
  Administered 2023-12-10: 50 mg via INTRAVENOUS

## 2023-12-10 MED ORDER — LIDOCAINE HCL (PF) 2 % IJ SOLN
INTRAMUSCULAR | Status: AC
  Filled 2023-12-10: qty 5

## 2023-12-10 NOTE — Anesthesia Postprocedure Evaluation (Signed)
 Anesthesia Post Note  Patient: Virginia Bird  Procedure(s) Performed: COLONOSCOPY WITH PROPOFOL ESOPHAGOGASTRODUODENOSCOPY (EGD) WITH PROPOFOL  Patient location during evaluation: PACU Anesthesia Type: General Level of consciousness: awake Pain management: satisfactory to patient Vital Signs Assessment: post-procedure vital signs reviewed and stable Respiratory status: spontaneous breathing Cardiovascular status: blood pressure returned to baseline Anesthetic complications: no   No notable events documented.   Last Vitals:  Vitals:   12/10/23 0835 12/10/23 0845  BP: 119/82 127/85  Pulse:    Resp:    Temp:    SpO2:      Last Pain:  Vitals:   12/10/23 0845  TempSrc:   PainSc: 0-No pain                 VAN STAVEREN,Jarae Nemmers

## 2023-12-10 NOTE — Anesthesia Preprocedure Evaluation (Signed)
 Anesthesia Evaluation  Patient identified by MRN, date of birth, ID band Patient awake    Reviewed: Allergy & Precautions, NPO status , Patient's Chart, lab work & pertinent test results  Airway Mallampati: II  TM Distance: >3 FB Neck ROM: full    Dental  (+) Teeth Intact   Pulmonary neg pulmonary ROS, Current SmokerPatient did not abstain from smoking.   Pulmonary exam normal        Cardiovascular Exercise Tolerance: Good negative cardio ROS Normal cardiovascular exam Rhythm:Regular Rate:Normal     Neuro/Psych negative neurological ROS  negative psych ROS   GI/Hepatic negative GI ROS, Neg liver ROS,GERD  Medicated,,  Endo/Other  negative endocrine ROS    Renal/GU negative Renal ROS  negative genitourinary   Musculoskeletal   Abdominal Normal abdominal exam  (+)   Peds negative pediatric ROS (+)  Hematology negative hematology ROS (+) Blood dyscrasia, anemia   Anesthesia Other Findings Past Medical History: No date: Abnormal Pap smear of cervix No date: Anemia No date: Chickenpox 10/05/2020: COVID-19 virus infection 11/21/2021: Status post hemorrhoidectomy No date: UTI (urinary tract infection)  Past Surgical History: 09/15/2000: DILATION AND CURETTAGE OF UTERUS     Comment:  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 11/08/2021: HEMORRHOID SURGERY; N/A     Comment:  Procedure: HEMORRHOIDECTOMY;  Surgeon: Campbell Lerner,              MD;  Location: [facility] ORS;  Service: General;  Laterality:               N/A; 11/08/2021: RECTAL EXAM UNDER ANESTHESIA; N/A     Comment:  Procedure: RECTAL EXAM UNDER ANESTHESIA;  Surgeon:               Campbell Lerner, MD;  Location: [facility] ORS;  Service:               General;  Laterality: N/A; No date: WISDOM TOOTH EXTRACTION  BMI    Body Mass Index: 28.83 kg/m      Reproductive/Obstetrics negative OB ROS                               Anesthesia Physical Anesthesia Plan  ASA: 2  Anesthesia Plan: General   Post-op Pain Management:    Induction: Intravenous  PONV Risk Score and Plan: Propofol infusion and TIVA  Airway Management Planned: Natural Airway and Nasal Cannula  Additional Equipment:   Intra-op Plan:   Post-operative Plan:   Informed Consent: I have reviewed the patients History and Physical, chart, labs and discussed the procedure including the risks, benefits and alternatives for the proposed anesthesia with the patient or authorized representative who has indicated his/her understanding and acceptance.     Dental Advisory Given  Plan Discussed with: CRNA  Anesthesia Plan Comments:          Anesthesia Quick Evaluation

## 2023-12-10 NOTE — Transfer of Care (Signed)
 Immediate Anesthesia Transfer of Care Note  Patient: Virginia Bird  Procedure(s) Performed: COLONOSCOPY WITH PROPOFOL ESOPHAGOGASTRODUODENOSCOPY (EGD) WITH PROPOFOL  Patient Location: PACU and Endoscopy Unit  Anesthesia Type:MAC  Level of Consciousness: drowsy  Airway & Oxygen Therapy: Patient Spontanous Breathing and Patient connected to nasal cannula oxygen  Post-op Assessment: Report given to RN and Post -op Vital signs reviewed and stable  Post vital signs: Reviewed and stable  Last Vitals:  Vitals Value Taken Time  BP 115/78 12/10/23 0825  Temp    Pulse 90 12/10/23 0826  Resp 20 12/10/23 0826  SpO2 97 % 12/10/23 0826  Vitals shown include unfiled device data.  Last Pain:  Vitals:   12/10/23 0745  TempSrc: Temporal  PainSc: 0-No pain         Complications: No notable events documented.

## 2023-12-10 NOTE — Op Note (Signed)
 [facility] Gastroenterology Patient Name: Virginia Bird Procedure Date: 12/10/2023 7:24 AM MRN: [id number] Account #: [id number] Date of Birth: [birth year] Admit Type: Outpatient Age: 43 Room: [facility] ENDO ROOM 2 Gender: Female Note Status: Finalized Instrument Name: Upper Endoscope [id number] Procedure:             Upper GI endoscopy Indications:           Iron deficiency anemia Providers:             Wyline Mood MD, MD Referring MD:          No Local Md, MD (Referring MD) Medicines:             Monitored Anesthesia Care Complications:         No immediate complications. Procedure:             Pre-Anesthesia Assessment:                        - Prior to the procedure, a History and Physical was                         performed, and patient medications, allergies and                         sensitivities were reviewed. The patient's tolerance                         of previous anesthesia was reviewed.                        - The risks and benefits of the procedure and the                         sedation options and risks were discussed with the                         patient. All questions were answered and informed                         consent was obtained.                        - ASA Grade Assessment: II - A patient with mild                         systemic disease.                        After obtaining informed consent, the endoscope was                         passed under direct vision. Throughout the procedure,                         the patient's blood pressure, pulse, and oxygen                         saturations were monitored continuously. The Endoscope  was introduced through the mouth, and advanced to the                         third part of duodenum. The upper GI endoscopy was                         accomplished with ease. The patient tolerated the                         procedure well. Findings:      The esophagus  was normal.      The stomach was normal.      The examined duodenum was normal. Impression:            - Normal esophagus.                        - Normal stomach.                        - Normal examined duodenum.                        - No specimens collected. Recommendation:        - Perform a colonoscopy today. Procedure Code(s):     --- Professional ---                        [phone], Esophagogastroduodenoscopy, flexible,                         transoral; diagnostic, including collection of                         specimen(s) by brushing or washing, when performed                         (separate procedure) Diagnosis Code(s):     --- Professional ---                        D50.9, Iron deficiency anemia, unspecified CPT copyright 2022 American Medical Association. All rights reserved. The codes documented in this report are preliminary and upon coder review may  be revised to meet current compliance requirements. Wyline Mood, MD Wyline Mood MD, MD 12/10/2023 8:09:23 AM This report has been signed electronically. Number of Addenda: 0 Note Initiated On: 12/10/2023 7:24 AM Estimated Blood Loss:  Estimated blood loss: none.      [facility]

## 2023-12-10 NOTE — Op Note (Signed)
 [facility] Gastroenterology Patient Name: Virginia Bird Procedure Date: 12/10/2023 7:22 AM MRN: [id number] Account #: [id number] Date of Birth: 12-13-1980 Admit Type: Outpatient Age: 43 Room: [facility] ENDO ROOM 2 Gender: Female Note Status: Finalized Instrument Name: Nelda Marseille [id number] Procedure:             Colonoscopy Indications:           Iron deficiency anemia Providers:             Wyline Mood MD, MD Referring MD:          No Local Md, MD (Referring MD) Medicines:             Monitored Anesthesia Care Complications:         No immediate complications. Procedure:             Pre-Anesthesia Assessment:                        - Prior to the procedure, a History and Physical was                         performed, and patient medications, allergies and                         sensitivities were reviewed. The patient's tolerance                         of previous anesthesia was reviewed.                        - The risks and benefits of the procedure and the                         sedation options and risks were discussed with the                         patient. All questions were answered and informed                         consent was obtained.                        - ASA Grade Assessment: II - A patient with mild                         systemic disease.                        After obtaining informed consent, the colonoscope was                         passed under direct vision. Throughout the procedure,                         the patient's blood pressure, pulse, and oxygen                         saturations were monitored continuously. The                         Colonoscope was introduced through  the anus and                         advanced to the the cecum, identified by the                         appendiceal orifice. The colonoscopy was performed                         with ease. The patient tolerated the procedure well.                          The quality of the bowel preparation was excellent.                         The ileocecal valve, appendiceal orifice, and rectum                         were photographed. Findings:      The perianal and digital rectal examinations were normal.      Could not intubate the ileum ? stenosis vs spasm trid multiple times      The exam was otherwise without abnormality on direct and retroflexion       views. Impression:            - The examination was otherwise normal on direct and                         retroflexion views.                        - No specimens collected. Recommendation:        - Discharge patient to home (with escort).                        - Resume previous diet.                        - Continue present medications.                        - MR enterographty to rule out crohns - i was unable                         to intubate the terminal ileum ? stenosis vs spasm .                        F/u in clinic in 8-12 weeks Procedure Code(s):     --- Professional ---                        [phone], Colonoscopy, flexible; diagnostic, including                         collection of specimen(s) by brushing or washing, when                         performed (separate procedure) Diagnosis Code(s):     --- Professional ---  D50.9, Iron deficiency anemia, unspecified CPT copyright 2022 American Medical Association. All rights reserved. The codes documented in this report are preliminary and upon coder review may  be revised to meet current compliance requirements. Wyline Mood, MD Wyline Mood MD, MD 12/10/2023 8:25:18 AM This report has been signed electronically. Number of Addenda: 0 Note Initiated On: 12/10/2023 7:22 AM Scope Withdrawal Time: 0 hours 9 minutes 29 seconds  Total Procedure Duration: 0 hours 11 minutes 29 seconds  Estimated Blood Loss:  Estimated blood loss: none.      [facility]

## 2023-12-10 NOTE — H&P (Signed)
 Wyline Mood, MD [address], [place], [place], [address] [address], [address], [place], [place], [address] Phone: [phone]  Fax: [phone]  Primary Care Physician:  Doreene Nest, NP   Pre-Procedure History & Physical: HPI:  MARELIN TAT is a 43 y.o. 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 Authorizing Provider  azelastine (ASTELIN) 0.1 % nasal spray Place 2 sprays into both nostrils 2 (two) times daily. Use in each nostril as directed 09/12/23  Yes Shirlee Latch, Darius, PA-C  EPINEPHrine 0.3 mg/0.3 mL IJ SOAJ injection Inject 0.3 mg into the muscle as needed for anaphylaxis. 04/01/23  Yes Doreene Nest, NP  famotidine (PEPCID) 20 MG tablet TAKE 1 TABLET (20 MG TOTAL) BY MOUTH DAILY. FOR HEARTBURN. 04/08/23  Yes Doreene Nest, NP  fluticasone (FLONASE) 50 MCG/ACT nasal spray PLACE 1 SPRAY INTO BOTH NOSTRILS 2 (TWO) TIMES DAILY AS NEEDED FOR ALLERGIES OR RHINITIS. 09/07/23  Yes Doreene Nest, NP  naproxen (NAPROSYN) 500 MG tablet Take 1 tablet (500 mg total) by mouth 2 (two) times daily with a meal. As needed for cramping Patient  taking differently: Take 500 mg by mouth daily as needed (for cramping). 01/15/23   Doreene Nest, NP    Allergies as of 11/02/2023   (No Known Allergies)    Family History  Problem Relation Age of Onset   Thyroid disease Mother    Hypertension Mother    Cancer Father        leukemia   Stroke Father    Cancer Maternal Aunt        Breast Cancer   Breast cancer Maternal Aunt    Asthma Sister    Miscarriages / [place] Sister     Social History   Socioeconomic History   Marital status: Single    Spouse name: Not on file   Number of children: 1   Years of education: Not on file   Highest education level: Not on file  Occupational History   Not on file  Tobacco Use   Smoking status: Every Day    Current packs/day: 0.25    Average packs/day: 0.3 packs/day for 10.0 years (2.5 ttl pk-yrs)    Types: Cigarettes   Smokeless tobacco: Never   Tobacco comments:    Has not tried any quit aids/tn  Vaping Use   Vaping status: Never Used  Substance and Sexual Activity   Alcohol use: Yes    Comment: daily cocktail   Drug use: No   Sexual activity: Yes    Partners: Male  Other Topics Concern  Not on file  Social History Narrative   Single.    1 daughter.    Works at [employer].   Enjoys going to [employer].    Social Drivers of Corporate investment banker Strain: Not on file  Food Insecurity: No Food Insecurity (06/09/2023)   Hunger Vital Sign    Worried About Running Out of Food in the Last Year: Never true    Ran Out of Food in the Last Year: Never true  Transportation Needs: No Transportation Needs (06/09/2023)   PRAPARE - Administrator, Civil Service (Medical): No    Lack of Transportation (Non-Medical): No  Physical Activity: Not on file  Stress: Not on file  Social Connections: Not on file  Intimate Partner Violence: Not At Risk (06/01/2023)   Humiliation, Afraid, Rape, and Kick questionnaire    Fear of Current or Ex-Partner: No     Emotionally Abused: No    Physically Abused: No    Sexually Abused: No    Review of Systems: See HPI, otherwise negative ROS  Physical Exam: BP 122/83   Pulse 91   Temp (!) 96.5 F (35.8 C) (Temporal)   Resp 16   Ht 5\' 1"  (1.549 m)   Wt 69.2 kg   LMP 11/30/2023   SpO2 100%   BMI 28.83 kg/m  General:   Alert,  pleasant and cooperative in NAD Head:  Normocephalic and atraumatic. Neck:  Supple; no masses or thyromegaly. Lungs:  Clear throughout to auscultation, normal respiratory effort.    Heart:  +S1, +S2, Regular rate and rhythm, No edema. Abdomen:  Soft, nontender and nondistended. Normal bowel sounds, without guarding, and without rebound.   Neurologic:  Alert and  oriented x4;  grossly normal neurologically.  Impression/Plan: Valora Piccolo is here for an endoscopy and colonoscopy  to be performed for  evaluation of iron deficiency anemia    Risks, benefits, limitations, and alternatives regarding endoscopy have been reviewed with the patient.  Questions have been answered.  All parties agreeable.   Wyline Mood, MD  12/10/2023, 7:48 AM

## 2023-12-11 ENCOUNTER — Other Ambulatory Visit: Payer: Self-pay

## 2023-12-11 ENCOUNTER — Encounter: Payer: Self-pay | Admitting: Gastroenterology

## 2023-12-11 ENCOUNTER — Telehealth: Payer: Self-pay

## 2023-12-11 DIAGNOSIS — R933 Abnormal findings on diagnostic imaging of other parts of digestive tract: Secondary | ICD-10-CM

## 2023-12-11 NOTE — Telephone Encounter (Signed)
 MRI Abdomen / MRCP scheduled 12/16/23 [facility] arrive at 2:30 am. Nothing to eat/drink after 11:00 am. Per Dr.Anna listed on Colonoscopy report.  Patient notified.

## 2023-12-14 ENCOUNTER — Encounter: Payer: Self-pay | Admitting: Oncology

## 2023-12-15 ENCOUNTER — Inpatient Hospital Stay: Payer: PRIVATE HEALTH INSURANCE

## 2023-12-15 ENCOUNTER — Ambulatory Visit

## 2023-12-15 ENCOUNTER — Inpatient Hospital Stay: Payer: PRIVATE HEALTH INSURANCE | Attending: Oncology

## 2023-12-15 VITALS — BP 134/88 | HR 75 | Temp 98.7°F | Resp 16

## 2023-12-15 DIAGNOSIS — N920 Excessive and frequent menstruation with regular cycle: Secondary | ICD-10-CM | POA: Insufficient documentation

## 2023-12-15 DIAGNOSIS — D509 Iron deficiency anemia, unspecified: Secondary | ICD-10-CM | POA: Insufficient documentation

## 2023-12-15 MED ORDER — IRON SUCROSE 20 MG/ML IV SOLN: 200.0000 mg | Freq: Once | INTRAVENOUS | Status: DC

## 2023-12-15 MED ORDER — SODIUM CHLORIDE 0.9% FLUSH
10.0000 mL | Freq: Once | INTRAVENOUS | Status: DC | PRN
  Filled 2023-12-15: qty 10

## 2023-12-15 NOTE — Progress Notes (Signed)
 Unable to obtain IV access. No iron given today. Patient will RTC on Friday for iron.

## 2023-12-16 ENCOUNTER — Ambulatory Visit: Payer: PRIVATE HEALTH INSURANCE

## 2023-12-17 ENCOUNTER — Other Ambulatory Visit: Payer: Self-pay | Admitting: Gastroenterology

## 2023-12-17 ENCOUNTER — Encounter: Payer: Self-pay | Admitting: Oncology

## 2023-12-17 ENCOUNTER — Ambulatory Visit
Admission: RE | Admit: 2023-12-17 | Discharge: 2023-12-17 | Disposition: A | Discharge status: Home / Self Care | Payer: PRIVATE HEALTH INSURANCE | Source: Ambulatory Visit | Attending: Gastroenterology | Admitting: Gastroenterology

## 2023-12-17 DIAGNOSIS — R933 Abnormal findings on diagnostic imaging of other parts of digestive tract: Secondary | ICD-10-CM

## 2023-12-17 MED ORDER — GADOBUTROL 1 MMOL/ML IV SOLN
6.0000 mL | Freq: Once | INTRAVENOUS | Status: AC | PRN
Start: 2023-12-17 — End: 2023-12-17
  Administered 2023-12-17: 6 mL via INTRAVENOUS

## 2023-12-18 ENCOUNTER — Encounter: Payer: Self-pay | Admitting: Oncology

## 2023-12-18 ENCOUNTER — Inpatient Hospital Stay: Payer: PRIVATE HEALTH INSURANCE

## 2023-12-22 DIAGNOSIS — R933 Abnormal findings on diagnostic imaging of other parts of digestive tract: Secondary | ICD-10-CM

## 2023-12-22 DIAGNOSIS — D500 Iron deficiency anemia secondary to blood loss (chronic): Secondary | ICD-10-CM

## 2024-01-01 ENCOUNTER — Inpatient Hospital Stay: Payer: PRIVATE HEALTH INSURANCE

## 2024-01-01 VITALS — BP 131/85 | HR 79 | Temp 97.7°F | Resp 17

## 2024-01-01 DIAGNOSIS — N920 Excessive and frequent menstruation with regular cycle: Secondary | ICD-10-CM | POA: Diagnosis present

## 2024-01-01 DIAGNOSIS — D509 Iron deficiency anemia, unspecified: Secondary | ICD-10-CM

## 2024-01-01 MED ORDER — SODIUM CHLORIDE 0.9% FLUSH
10.0000 mL | Freq: Once | INTRAVENOUS | Status: AC | PRN
  Administered 2024-01-01: 10 mL
  Filled 2024-01-01: qty 10

## 2024-01-01 MED ORDER — IRON SUCROSE 20 MG/ML IV SOLN
200.0000 mg | Freq: Once | INTRAVENOUS | Status: AC
  Administered 2024-01-01: 200 mg via INTRAVENOUS

## 2024-01-04 ENCOUNTER — Telehealth: Payer: Self-pay

## 2024-01-04 NOTE — Telephone Encounter (Signed)
 This is my third time trying to reach out to patient. I had sent her a message through MyChart and a phone call. However, the patient is not responding back to my message through MyChart and/or telephone calls. Today I called her cell phone and left her a detailed message letting her know the importance of her returning my call or messaging me back on MyChart. I hope she reaches out soon, if not, I will also send her a letter.Dr. Antony Baumgartner was also notified.

## 2024-01-04 NOTE — Telephone Encounter (Signed)
-----   Message from Luke Salaam sent at 12/28/2023  1:16 PM EDT ----- Norita Beauvais please inform the patient that the MRI shows findings suggestive of Crohn's disease.  Would recommend the following  Plan 1.  Inquire if she is having diarrhea, abdominal pain or any GI symptoms 2.  Obtain CBC, CMP, CRP, fecal calprotectin, TB QuantiFERON, hepatitis A total antibody, hepatitis B surface antibody, hepatitis B surface antigen, hepatitis B core total antibody, hepatitis C antibody. 3.  Ensure not on any NSAIDs. 4.  I will need to see her in the office once we have all the lab results.  Please touch base with me to find a spot for her.  Inform her that I am leaving the practice and it is important once we make the appointment for her to come and see me to [facility] scuss about next steps and biological treatment that is very likely going to be warranted.  Can you also check her insurance to see what we are likely going to get approved for Crohn's disease.  C.c Clark, Katherine K, NP    Dr Luke Salaam MD,MRCP [facility]) Gastroenterology/Hepatology Pager: [phone]

## 2024-01-05 NOTE — Telephone Encounter (Signed)
 Yes, I understand sir. Hopefully she goes soon.

## 2024-01-05 NOTE — Telephone Encounter (Signed)
 Patient finally reached out and I let her know that you are wanting for her to have additional labs so to go to a [facility] location to have them drawn soon. So, do I move forward with looking into what medication she could use for Crohn's?

## 2024-01-06 ENCOUNTER — Encounter: Payer: Self-pay | Admitting: Primary Care

## 2024-01-06 ENCOUNTER — Ambulatory Visit: Payer: PRIVATE HEALTH INSURANCE | Admitting: Primary Care

## 2024-01-06 VITALS — BP 124/76 | HR 84 | Temp 97.7°F | Ht 61.0 in | Wt 158.0 lb

## 2024-01-06 DIAGNOSIS — R102 Pelvic and perineal pain: Secondary | ICD-10-CM

## 2024-01-06 DIAGNOSIS — R350 Frequency of micturition: Secondary | ICD-10-CM | POA: Diagnosis not present

## 2024-01-06 HISTORY — DX: Pelvic and perineal pain: R10.2

## 2024-01-06 LAB — POC URINALSYSI DIPSTICK (AUTOMATED)
Bilirubin, UA: NEGATIVE
Blood, UA: POSITIVE
Glucose, UA: NEGATIVE
Ketones, UA: POSITIVE
Leukocytes, UA: NEGATIVE
Nitrite, UA: NEGATIVE
Protein, UA: POSITIVE — AB
Spec Grav, UA: >=1.03 — AB (ref 1.010–1.025)
Urobilinogen, UA: 0.2 U/dL
pH, UA: 5 (ref 5.0–8.0)

## 2024-01-06 LAB — URINALYSIS, MICROSCOPIC ONLY

## 2024-01-06 MED ORDER — ACETAMINOPHEN 325 MG PO CAPS: 1.0000 | ORAL_CAPSULE | Freq: Three times a day (TID) | ORAL | 0 refills | Status: AC | PRN

## 2024-01-06 NOTE — Assessment & Plan Note (Signed)
 UA today overall appears negative, will obtain urine culture and microscopic.  Wet prep completed today. Testing for gonorrhea, chlamydia completed today.  Await results.

## 2024-01-06 NOTE — Assessment & Plan Note (Signed)
 UA today with trace blood, otherwise negative.   Urine culture and microscopic ordered and pending. Await results.

## 2024-01-06 NOTE — Addendum Note (Signed)
 Addended by: Hershell Lose on: 01/06/2024 01:08 PM   Modules accepted: Orders

## 2024-01-06 NOTE — Progress Notes (Signed)
 Subjective:    Patient ID: Virginia Bird, female    DOB: 10/22/1980, 43 y.o.   MRN: [id number]  Urinary Frequency  Associated symptoms include frequency. Pertinent negatives include no hematuria.    Virginia Bird is a very pleasant 43 y.o. female with a history of infectious enteritis, dysfunctional uterine bleeding, urinary frequency, sepsis who presents today to discuss urinary frequency.  Symptom onset 1 week ago with urinary frequency, suprapubic tenderness upon palpation.  She denies vaginal discharge, vaginal itching, dysuria, hematuria, flank pain, fevers.     Review of Systems  Genitourinary:  Positive for frequency and pelvic pain. Negative for dysuria, hematuria and vaginal discharge.         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/21/2021   UTI (urinary tract infection)     Social History   Socioeconomic History   Marital status: Single    Spouse name: Not on file   Number of children: 1   Years of education: Not on file   Highest education level: Not on file  Occupational History   Not on file  Tobacco Use   Smoking status: Every Day    Current packs/day: 0.25    Average packs/day: 0.3 packs/day for 10.0 years (2.5 ttl pk-yrs)    Types: Cigarettes   Smokeless tobacco: Never   Tobacco comments:    Has not tried any quit aids/tn  Vaping Use   Vaping status: Never Used  Substance and Sexual Activity   Alcohol use: Yes    Comment: daily cocktail   Drug use: No   Sexual activity: Yes    Partners: Male  Other Topics Concern   Not on file  Social History Narrative   Single.    1 daughter.    Works at [employer].   Enjoys going to [employer].    Social Drivers of Corporate investment banker Strain: Not on file  Food Insecurity: No Food Insecurity (06/09/2023)   Hunger Vital Sign    Worried About Running  Out of Food in the Last Year: Never true    Ran Out of Food in the Last Year: Never true  Transportation Needs: No Transportation Needs (06/09/2023)   PRAPARE - Administrator, Civil Service (Medical): No    Lack of Transportation (Non-Medical): No  Physical Activity: Not on file  Stress: Not on file  Social Connections: Not on file  Intimate Partner Violence: Not At Risk (06/01/2023)   Humiliation, Afraid, Rape, and Kick questionnaire    Fear of Current or Ex-Partner: No    Emotionally Abused: No    Physically Abused: No    Sexually Abused: No    Past Surgical History:  Procedure Laterality Date   COLONOSCOPY WITH PROPOFOL  N/A 12/10/2023   Procedure: COLONOSCOPY WITH PROPOFOL ;  Surgeon: Luke Salaam, MD;  Location: [facility] ENDOSCOPY;  Service: Gastroenterology;  Laterality: N/A;   DILATION AND CURETTAGE OF UTERUS  09/15/2000   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   ESOPHAGOGASTRODUODENOSCOPY (EGD) WITH PROPOFOL  N/A 12/10/2023   Procedure: ESOPHAGOGASTRODUODENOSCOPY (EGD) WITH PROPOFOL ;  Surgeon: Luke Salaam, MD;  Location: [facility] ENDOSCOPY;  Service: Gastroenterology;  Laterality: N/A;   HEMORRHOID SURGERY N/A 11/08/2021   Procedure: HEMORRHOIDECTOMY;  Surgeon: Flynn Hylan, MD;  Location: [facility] ORS;  Service: General;  Laterality: N/A;  RECTAL EXAM UNDER ANESTHESIA N/A 11/08/2021   Procedure: RECTAL EXAM UNDER ANESTHESIA;  Surgeon: Flynn Hylan, MD;  Location: [facility] ORS;  Service: General;  Laterality: N/A;   WISDOM TOOTH EXTRACTION      Family History  Problem Relation Age of Onset   Thyroid  disease Mother    Hypertension Mother    Cancer Father        leukemia   Stroke Father    Cancer Maternal Aunt        Breast Cancer   Breast cancer Maternal Aunt    Asthma Sister    Miscarriages / Stillbirths Sister     No Known Allergies  Current Outpatient Medications on File Prior to Visit  Medication Sig Dispense Refill   azelastine  (ASTELIN ) 0.1 %  nasal spray Place 2 sprays into both nostrils 2 (two) times daily. Use in each nostril as directed 30 mL 12   EPINEPHrine  0.3 mg/0.3 mL IJ SOAJ injection Inject 0.3 mg into the muscle as needed for anaphylaxis. 1 each 0   famotidine  (PEPCID ) 20 MG tablet TAKE 1 TABLET (20 MG TOTAL) BY MOUTH DAILY. FOR HEARTBURN. 90 tablet 2   fluticasone  (FLONASE ) 50 MCG/ACT nasal spray PLACE 1 SPRAY INTO BOTH NOSTRILS 2 (TWO) TIMES DAILY AS NEEDED FOR ALLERGIES OR RHINITIS. 48 mL 0   naproxen  (NAPROSYN ) 500 MG tablet Take 1 tablet (500 mg total) by mouth 2 (two) times daily with a meal. As needed for cramping (Patient taking differently: Take 500 mg by mouth daily as needed (for cramping).) 60 tablet 1   No current facility-administered medications on file prior to visit.    BP 124/76   Pulse 84   Temp 97.7 F (36.5 C) (Temporal)   Ht 5\' 1"  (1.549 m)   Wt 158 lb (71.7 kg)   LMP 12/28/2023   SpO2 97%   BMI 29.85 kg/m  Objective:   Physical Exam Exam conducted with a chaperone present.  Cardiovascular:     Rate and Rhythm: Normal rate and regular rhythm.  Pulmonary:     Effort: Pulmonary effort is normal.     Breath sounds: Normal breath sounds.  Genitourinary:    Labia:        Right: No rash, tenderness or lesion.        Left: No rash, tenderness or lesion.      Vagina: Vaginal discharge present.     Cervix: Normal.     Uterus: Normal.      Adnexa: Right adnexa normal and left adnexa normal.     Comments: Light green/yellow vaginal discharge Musculoskeletal:     Cervical back: Neck supple.  Skin:    General: Skin is warm and dry.  Neurological:     Mental Status: She is alert and oriented to person, place, and time.  Psychiatric:        Mood and Affect: Mood normal.           Assessment & Plan:  Urinary frequency Assessment & Plan: UA today with trace blood, otherwise negative.   Urine culture and microscopic ordered and pending. Await results.   Orders: -     Urine  Culture -     POCT Urinalysis Dipstick (Automated) -     Urine Microscopic  Pelvic pain Assessment & Plan: UA today overall appears negative, will obtain urine culture and microscopic.  Wet prep completed today. Testing for gonorrhea, chlamydia completed today.  Await results.   Orders: -     WET PREP  BY MOLECULAR PROBE -     C. trachomatis/N. gonorrhoeae RNA -     Urine Microscopic        Gabriel John, NP

## 2024-01-07 ENCOUNTER — Other Ambulatory Visit: Payer: Self-pay | Admitting: Primary Care

## 2024-01-07 DIAGNOSIS — B9689 Other specified bacterial agents as the cause of diseases classified elsewhere: Secondary | ICD-10-CM

## 2024-01-07 LAB — WET PREP BY MOLECULAR PROBE
Candida species: NOT DETECTED
MICRO NUMBER:: 16365229
SPECIMEN QUALITY:: ADEQUATE
Trichomonas vaginosis: NOT DETECTED

## 2024-01-07 LAB — URINE CULTURE
MICRO NUMBER:: 16365230
SPECIMEN QUALITY:: ADEQUATE

## 2024-01-07 LAB — C. TRACHOMATIS/N. GONORRHOEAE RNA
C. trachomatis RNA, TMA: NOT DETECTED
N. gonorrhoeae RNA, TMA: NOT DETECTED

## 2024-01-07 MED ORDER — METRONIDAZOLE 500 MG PO TABS: 500.0000 mg | ORAL_TABLET | Freq: Two times a day (BID) | ORAL | 0 refills | Status: AC

## 2024-01-19 ENCOUNTER — Encounter: Payer: Self-pay | Admitting: Primary Care

## 2024-01-19 ENCOUNTER — Ambulatory Visit (INDEPENDENT_AMBULATORY_CARE_PROVIDER_SITE_OTHER): Payer: PRIVATE HEALTH INSURANCE | Admitting: Primary Care

## 2024-01-19 VITALS — BP 98/60 | HR 93 | Temp 97.9°F | Ht 61.75 in | Wt 154.0 lb

## 2024-01-19 DIAGNOSIS — K5090 Crohn's disease, unspecified, without complications: Secondary | ICD-10-CM | POA: Diagnosis not present

## 2024-01-19 DIAGNOSIS — K219 Gastro-esophageal reflux disease without esophagitis: Secondary | ICD-10-CM | POA: Diagnosis not present

## 2024-01-19 DIAGNOSIS — N920 Excessive and frequent menstruation with regular cycle: Secondary | ICD-10-CM

## 2024-01-19 DIAGNOSIS — Z131 Encounter for screening for diabetes mellitus: Secondary | ICD-10-CM

## 2024-01-19 DIAGNOSIS — J3089 Other allergic rhinitis: Secondary | ICD-10-CM

## 2024-01-19 DIAGNOSIS — Z91018 Allergy to other foods: Secondary | ICD-10-CM

## 2024-01-19 DIAGNOSIS — Z0000 Encounter for general adult medical examination without abnormal findings: Secondary | ICD-10-CM | POA: Diagnosis not present

## 2024-01-19 LAB — HEMOGLOBIN A1C: Hgb A1c MFr Bld: 5.2 % (ref 4.6–6.5)

## 2024-01-19 LAB — LIPID PANEL
Cholesterol: 131 mg/dL (ref 0–200)
HDL: 45.2 mg/dL (ref >39.00)
LDL Cholesterol: 54 mg/dL (ref 0–99)
NonHDL: 86.01
Total CHOL/HDL Ratio: 3
Triglycerides: 162 mg/dL — ABNORMAL HIGH (ref 0.0–149.0)
VLDL: 32.4 mg/dL (ref 0.0–40.0)

## 2024-01-19 NOTE — Assessment & Plan Note (Signed)
 Stable.  Continue Astelin  nasal spray.   Agree to update work note for excuse from working outdoors [employer].

## 2024-01-19 NOTE — Assessment & Plan Note (Signed)
 No recent outbreaks.  No use of Epi Pen.

## 2024-01-19 NOTE — Assessment & Plan Note (Signed)
 Following with GI.  Await lab results per GI.

## 2024-01-19 NOTE — Assessment & Plan Note (Signed)
 Improved. Continue to monitor.

## 2024-01-19 NOTE — Patient Instructions (Signed)
 Stop by the lab prior to leaving today. I will notify you of your results once received.   It was a pleasure to see you today!

## 2024-01-19 NOTE — Assessment & Plan Note (Signed)
 Controlled.  Continue famotidine 20 mg PRN.

## 2024-01-19 NOTE — Progress Notes (Signed)
 Subjective:    Patient ID: Virginia Bird, female    DOB: October 30, 1980, 43 y.o.   MRN: [id number]  HPI  Virginia Bird is a very pleasant 43 y.o. female with a history of pneumonia, dysfunctional uterine bleeding, infectious enteritis, crohn's disease who presents today for complete physical and follow up of chronic conditions.  Immunizations: -Tetanus: Completed > 10 years ago, declines    Diet: Fair diet.  Exercise: No regular exercise.  Eye exam: Completes annually  Dental exam: Completes semi-annually    Pap Smear: Completed in 2024 Mammogram: Completed in May 2024, scheduled for May 2025  Colonoscopy: Completed in 2025  BP Readings from Last 3 Encounters:  01/19/24 98/60  01/06/24 124/76  01/01/24 131/85       Review of Systems  Constitutional:  Negative for unexpected weight change.  HENT:  Negative for rhinorrhea.   Respiratory:  Negative for cough and shortness of breath.   Cardiovascular:  Negative for chest pain.  Gastrointestinal:  Negative for constipation and diarrhea.  Genitourinary:  Negative for difficulty urinating and menstrual problem.  Musculoskeletal:  Negative for arthralgias and myalgias.  Skin:  Negative for rash.  Allergic/Immunologic: Negative for environmental allergies.  Neurological:  Negative for dizziness and headaches.  Psychiatric/Behavioral:  The patient is not nervous/anxious.          Past Medical History:  Diagnosis Date   Abnormal Pap smear of cervix    Anemia    Chickenpox    COVID-19 virus infection 10/05/2020   Infectious enteritis 06/01/2023   Pelvic pain 01/06/2024   Pneumonia of both lungs due to infectious organism 06/07/2023   Sepsis with acute organ dysfunction without septic shock (HCC) 06/08/2023   Status post hemorrhoidectomy 11/21/2021   UTI (urinary tract infection)     Social History   Socioeconomic History   Marital status: Single    Spouse name: Not on file   Number of children: 1    Years of education: Not on file   Highest education level: Not on file  Occupational History   Not on file  Tobacco Use   Smoking status: Every Day    Current packs/day: 0.25    Average packs/day: 0.3 packs/day for 10.0 years (2.5 ttl pk-yrs)    Types: Cigarettes   Smokeless tobacco: Never   Tobacco comments:    Has not tried any quit aids/tn  Vaping Use   Vaping status: Never Used  Substance and Sexual Activity   Alcohol use: Yes    Comment: daily cocktail   Drug use: No   Sexual activity: Yes    Partners: Male  Other Topics Concern   Not on file  Social History Narrative   Single.    1 daughter.    Works at [employer].   Enjoys going to [employer].    Social Drivers of Corporate investment banker Strain: Not on file  Food Insecurity: No Food Insecurity (06/09/2023)   Hunger Vital Sign    Worried About Running Out of Food in the Last Year: Never true    Ran Out of Food in the Last Year: Never true  Transportation Needs: No Transportation Needs (06/09/2023)   PRAPARE - Administrator, Civil Service (Medical): No    Lack of Transportation (Non-Medical): No  Physical Activity: Not on file  Stress: Not on file  Social Connections: Not on file  Intimate Partner Violence: Not At Risk (06/01/2023)   Humiliation, Afraid, Rape, and Kick questionnaire  Fear of Current or Ex-Partner: No    Emotionally Abused: No    Physically Abused: No    Sexually Abused: No    Past Surgical History:  Procedure Laterality Date   COLONOSCOPY WITH PROPOFOL  N/A 12/10/2023   Procedure: COLONOSCOPY WITH PROPOFOL ;  Surgeon: Luke Salaam, MD;  Location: [facility] ENDOSCOPY;  Service: Gastroenterology;  Laterality: N/A;   DILATION AND CURETTAGE OF UTERUS  09/15/2000   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   ESOPHAGOGASTRODUODENOSCOPY (EGD) WITH PROPOFOL  N/A 12/10/2023   Procedure: ESOPHAGOGASTRODUODENOSCOPY (EGD) WITH PROPOFOL ;  Surgeon: Luke Salaam, MD;  Location: [facility]  ENDOSCOPY;  Service: Gastroenterology;  Laterality: N/A;   HEMORRHOID SURGERY N/A 11/08/2021   Procedure: HEMORRHOIDECTOMY;  Surgeon: Flynn Hylan, MD;  Location: [facility] ORS;  Service: General;  Laterality: N/A;   RECTAL EXAM UNDER ANESTHESIA N/A 11/08/2021   Procedure: RECTAL EXAM UNDER ANESTHESIA;  Surgeon: Flynn Hylan, MD;  Location: [facility] ORS;  Service: General;  Laterality: N/A;   WISDOM TOOTH EXTRACTION      Family History  Problem Relation Age of Onset   Thyroid  disease Mother    Hypertension Mother    Cancer Father        leukemia   Stroke Father    Cancer Maternal Aunt        Breast Cancer   Breast cancer Maternal Aunt    Asthma Sister    Miscarriages / Stillbirths Sister     No Known Allergies  Current Outpatient Medications on File Prior to Visit  Medication Sig Dispense Refill   Acetaminophen  325 MG CAPS Take 1 capsule by mouth 3 (three) times daily as needed. 270 capsule 0   azelastine  (ASTELIN ) 0.1 % nasal spray Place 2 sprays into both nostrils 2 (two) times daily. Use in each nostril as directed 30 mL 12   EPINEPHrine  0.3 mg/0.3 mL IJ SOAJ injection Inject 0.3 mg into the muscle as needed for anaphylaxis. 1 each 0   famotidine  (PEPCID ) 20 MG tablet TAKE 1 TABLET (20 MG TOTAL) BY MOUTH DAILY. FOR HEARTBURN. 90 tablet 2   fluticasone  (FLONASE ) 50 MCG/ACT nasal spray PLACE 1 SPRAY INTO BOTH NOSTRILS 2 (TWO) TIMES DAILY AS NEEDED FOR ALLERGIES OR RHINITIS. 48 mL 0   No current facility-administered medications on file prior to visit.    BP 98/60   Pulse 93   Temp 97.9 F (36.6 C) (Oral)   Ht 5' 1.75" (1.568 m)   Wt 154 lb (69.9 kg)   LMP 01/09/2024 (Exact Date)   SpO2 97%   BMI 28.40 kg/m  Objective:   Physical Exam HENT:     Right Ear: Tympanic membrane and ear canal normal.     Left Ear: Tympanic membrane and ear canal normal.  Eyes:     Pupils: Pupils are equal, round, and reactive to light.  Cardiovascular:     Rate and Rhythm: Normal rate and  regular rhythm.  Pulmonary:     Effort: Pulmonary effort is normal.     Breath sounds: Normal breath sounds.  Abdominal:     General: Bowel sounds are normal.     Palpations: Abdomen is soft.     Tenderness: There is no abdominal tenderness.  Musculoskeletal:        General: Normal range of motion.     Cervical back: Neck supple.  Skin:    General: Skin is warm and dry.  Neurological:     Mental Status: She is alert and oriented to person,  place, and time.     Cranial Nerves: No cranial nerve deficit.     Deep Tendon Reflexes:     Reflex Scores:      Patellar reflexes are 2+ on the right side and 2+ on the left side. Psychiatric:        Mood and Affect: Mood normal.           Assessment & Plan:  Preventative health care Assessment & Plan: Declines Tetanus vaccine. Pap smear UTD. Mammogram scheduled  Colonoscopy UTD, following with GI  Discussed the importance of a healthy diet and regular exercise in order for weight loss, and to reduce the risk of further co-morbidity.  Exam stable. Labs reviewed and pending.  Follow up in 1 year for repeat physical.   Orders: -     Lipid panel -     Hemoglobin A1c  Gastroesophageal reflux disease, unspecified whether esophagitis present Assessment & Plan: Controlled.  Continue famotidine  20 mg PRN.    Menorrhagia with regular cycle Assessment & Plan: Improved.  Continue to monitor .   Crohn's disease without complication, unspecified gastrointestinal tract location [facility]) Assessment & Plan: Following with GI.  Await lab results per GI.   Multiple food allergies Assessment & Plan: No recent outbreaks.  No use of Epi Pen.     Environmental and seasonal allergies Assessment & Plan: Stable.  Continue Astelin  nasal spray.   Agree to update work note for excuse from working outdoors [employer].          Sallee Hogrefe K Zenobia Kuennen, NP

## 2024-01-19 NOTE — Assessment & Plan Note (Addendum)
 Declines Tetanus vaccine. Pap smear UTD. Mammogram scheduled  Colonoscopy UTD, following with GI  Discussed the importance of a healthy diet and regular exercise in order for weight loss, and to reduce the risk of further co-morbidity.  Exam stable. Labs reviewed and pending.  Follow up in 1 year for repeat physical.

## 2024-01-20 LAB — HEPATITIS A ANTIBODY, TOTAL: hep A Total Ab: NEGATIVE

## 2024-01-20 LAB — COMPREHENSIVE METABOLIC PANEL WITH GFR
ALT: 12 IU/L (ref 0–32)
AST: 16 IU/L (ref 0–40)
Albumin: 4 g/dL (ref 3.9–4.9)
Alkaline Phosphatase: 55 IU/L (ref 44–121)
BUN/Creatinine Ratio: 15 (ref 9–23)
BUN: 11 mg/dL (ref 6–24)
Bilirubin Total: <0.2 mg/dL (ref 0.0–1.2)
CO2: 21 mmol/L (ref 20–29)
Calcium: 8.8 mg/dL (ref 8.7–10.2)
Chloride: 106 mmol/L (ref 96–106)
Creatinine, Ser: 0.73 mg/dL (ref 0.57–1.00)
Globulin, Total: 2.7 g/dL (ref 1.5–4.5)
Glucose: 116 mg/dL — ABNORMAL HIGH (ref 70–99)
Potassium: 3.9 mmol/L (ref 3.5–5.2)
Sodium: 140 mmol/L (ref 134–144)
Total Protein: 6.7 g/dL (ref 6.0–8.5)
eGFR: 104 mL/min/{1.73_m2} (ref >59)

## 2024-01-20 LAB — QUANTIFERON-TB GOLD PLUS
QuantiFERON Nil Value: 0.2 [IU]/mL
QuantiFERON TB1 Ag Value: 0.11 [IU]/mL
QuantiFERON TB2 Ag Value: 0.08 [IU]/mL

## 2024-01-20 LAB — CBC WITH DIFFERENTIAL/PLATELET
Basophils Absolute: 0.1 10*3/uL (ref 0.0–0.2)
Basos: 1 %
EOS (ABSOLUTE): 0.5 10*3/uL — ABNORMAL HIGH (ref 0.0–0.4)
Eos: 7 %
Hematocrit: 34 % (ref 34.0–46.6)
Hemoglobin: 10.7 g/dL — ABNORMAL LOW (ref 11.1–15.9)
Immature Grans (Abs): 0 10*3/uL (ref 0.0–0.1)
Immature Granulocytes: 0 %
Lymphocytes Absolute: 1.8 10*3/uL (ref 0.7–3.1)
Lymphs: 24 %
MCH: 26.2 pg — ABNORMAL LOW (ref 26.6–33.0)
MCHC: 31.5 g/dL (ref 31.5–35.7)
MCV: 83 fL (ref 79–97)
Monocytes Absolute: 0.4 10*3/uL (ref 0.1–0.9)
Monocytes: 6 %
Neutrophils Absolute: 4.6 10*3/uL (ref 1.4–7.0)
Neutrophils: 62 %
Platelets: 331 10*3/uL (ref 150–450)
RBC: 4.09 x10E6/uL (ref 3.77–5.28)
RDW: 19.9 % — ABNORMAL HIGH (ref 11.7–15.4)
WBC: 7.4 10*3/uL (ref 3.4–10.8)

## 2024-01-20 LAB — C-REACTIVE PROTEIN: CRP: 6 mg/L (ref 0–10)

## 2024-01-20 LAB — HEPATITIS B SURFACE ANTIGEN: Hepatitis B Surface Ag: NEGATIVE

## 2024-01-20 LAB — CALPROTECTIN, FECAL: Calprotectin, Fecal: 241 ug/g — ABNORMAL HIGH (ref 0–120)

## 2024-01-20 LAB — HEPATITIS B CORE ANTIBODY, TOTAL: Hep B Core Total Ab: NEGATIVE

## 2024-01-20 LAB — HEPATITIS B SURFACE ANTIBODY,QUALITATIVE: Hep B Surface Ab, Qual: NONREACTIVE

## 2024-01-20 LAB — HEPATITIS C ANTIBODY: Hep C Virus Ab: NONREACTIVE

## 2024-01-21 ENCOUNTER — Ambulatory Visit
Admission: RE | Admit: 2024-01-21 | Discharge: 2024-01-21 | Disposition: A | Discharge status: Home / Self Care | Payer: PRIVATE HEALTH INSURANCE | Source: Ambulatory Visit | Attending: Primary Care | Admitting: Primary Care

## 2024-01-21 DIAGNOSIS — Z1231 Encounter for screening mammogram for malignant neoplasm of breast: Secondary | ICD-10-CM | POA: Insufficient documentation

## 2024-01-22 ENCOUNTER — Other Ambulatory Visit: Payer: Self-pay | Admitting: Primary Care

## 2024-01-22 DIAGNOSIS — R928 Other abnormal and inconclusive findings on diagnostic imaging of breast: Secondary | ICD-10-CM

## 2024-01-23 IMAGING — MG MM DIGITAL SCREENING BILAT W/ TOMO AND CAD
6 of 12 series · 6 of 36 positions shown · non-contrast
Comparison: Previous exam(s).

CLINICAL DATA: Screening.

EXAM:
DIGITAL SCREENING BILATERAL MAMMOGRAM WITH TOMOSYNTHESIS AND CAD
TECHNIQUE: Bilateral screening digital craniocaudal and mediolateral oblique
mammograms were obtained. Bilateral screening digital breast
tomosynthesis was performed. The images were evaluated with
computer-aided detection.

[L MLO synth-2D (1 of 2)]
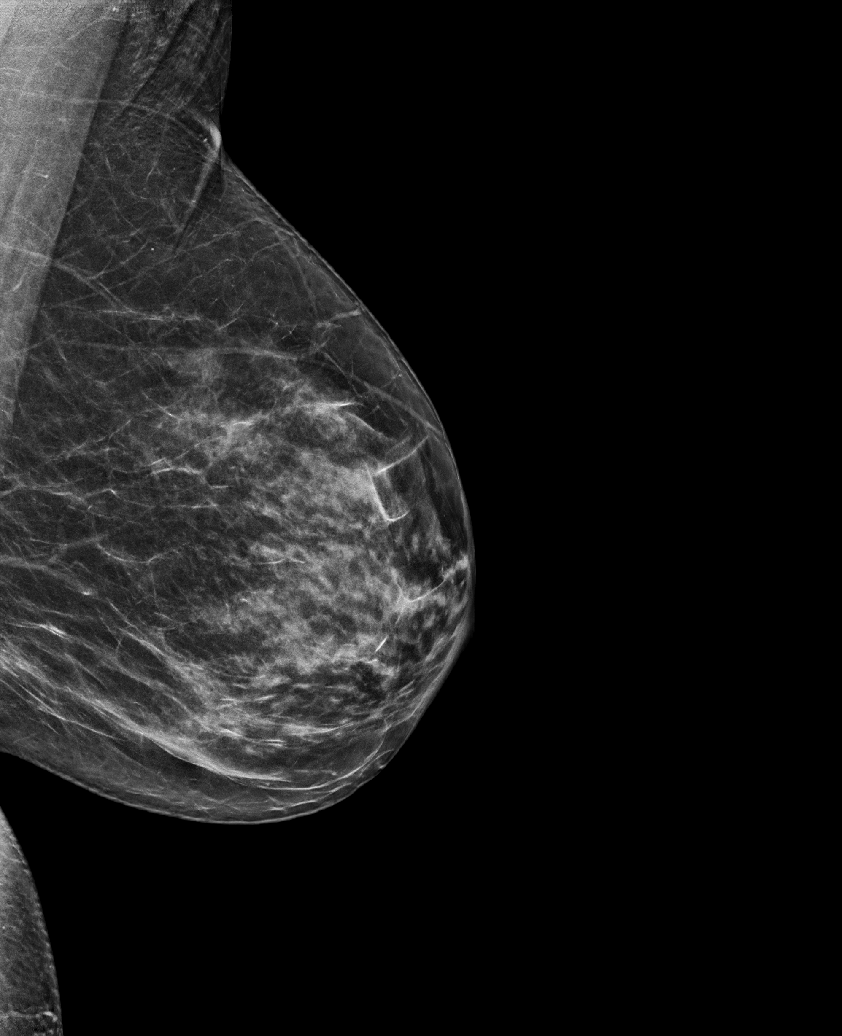

[R MLO synth-2D (1 of 2)]
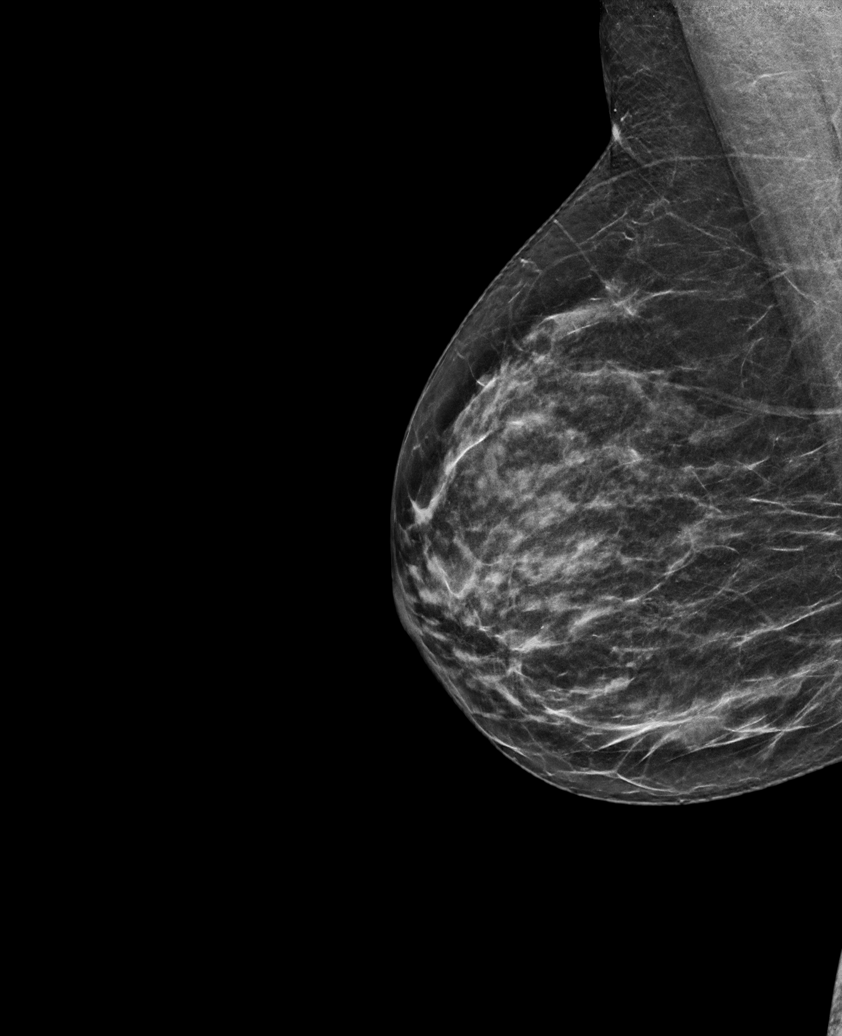

[R MLO synth-2D (2 of 2)]
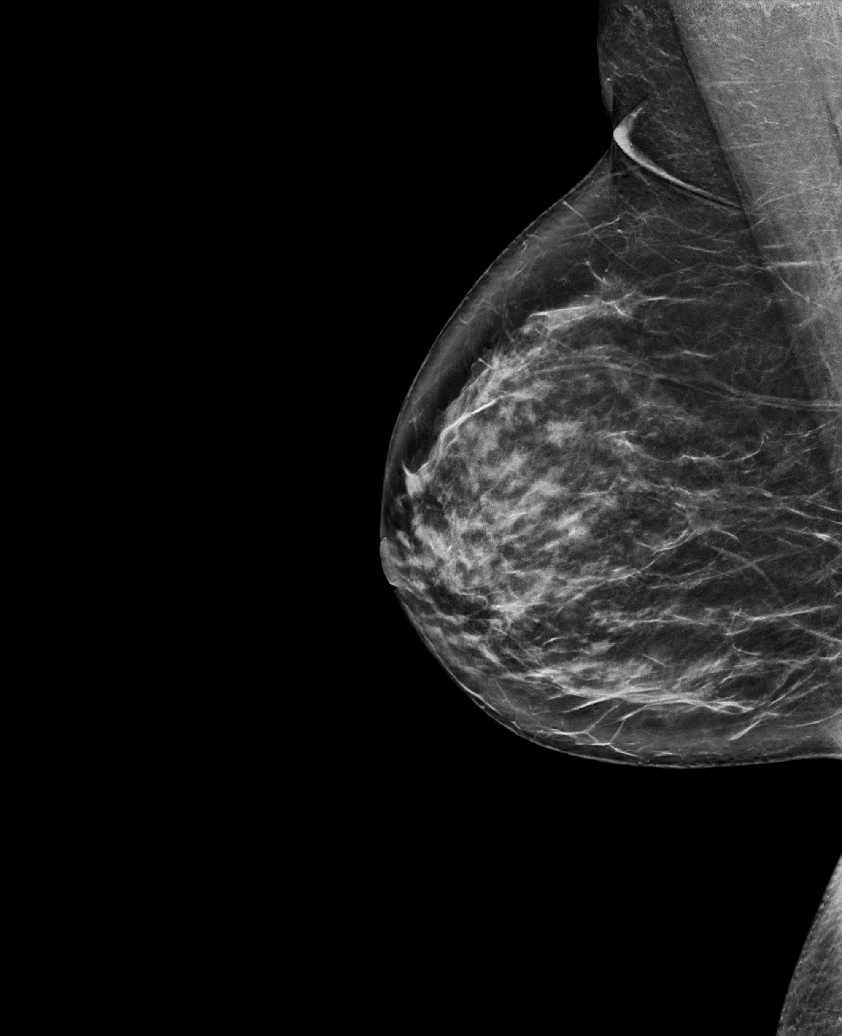

[R CC synth-2D]
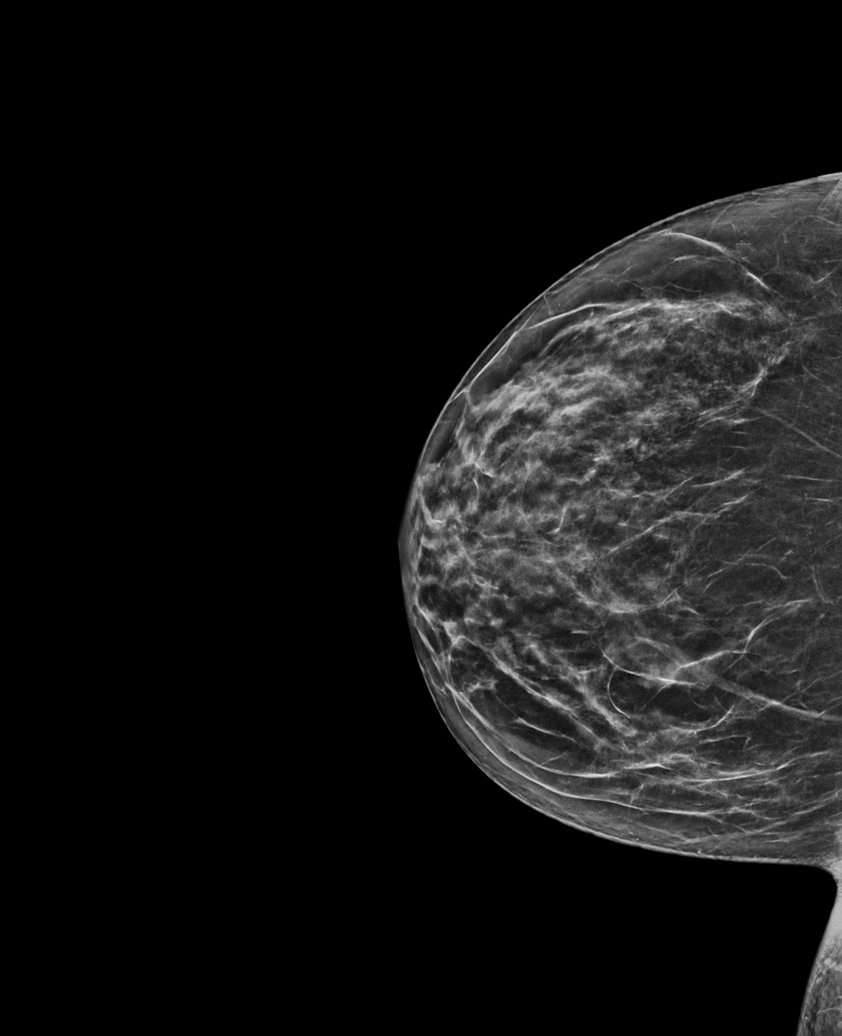

[L CC synth-2D]
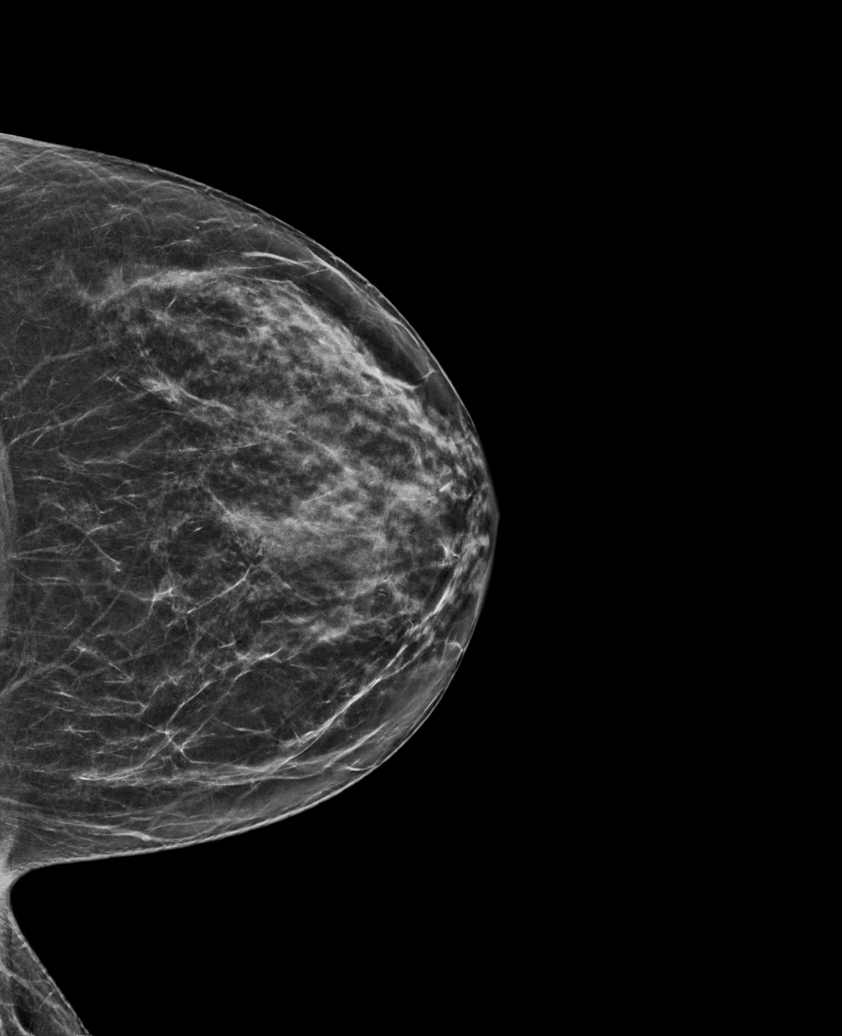

[L MLO synth-2D (2 of 2)]
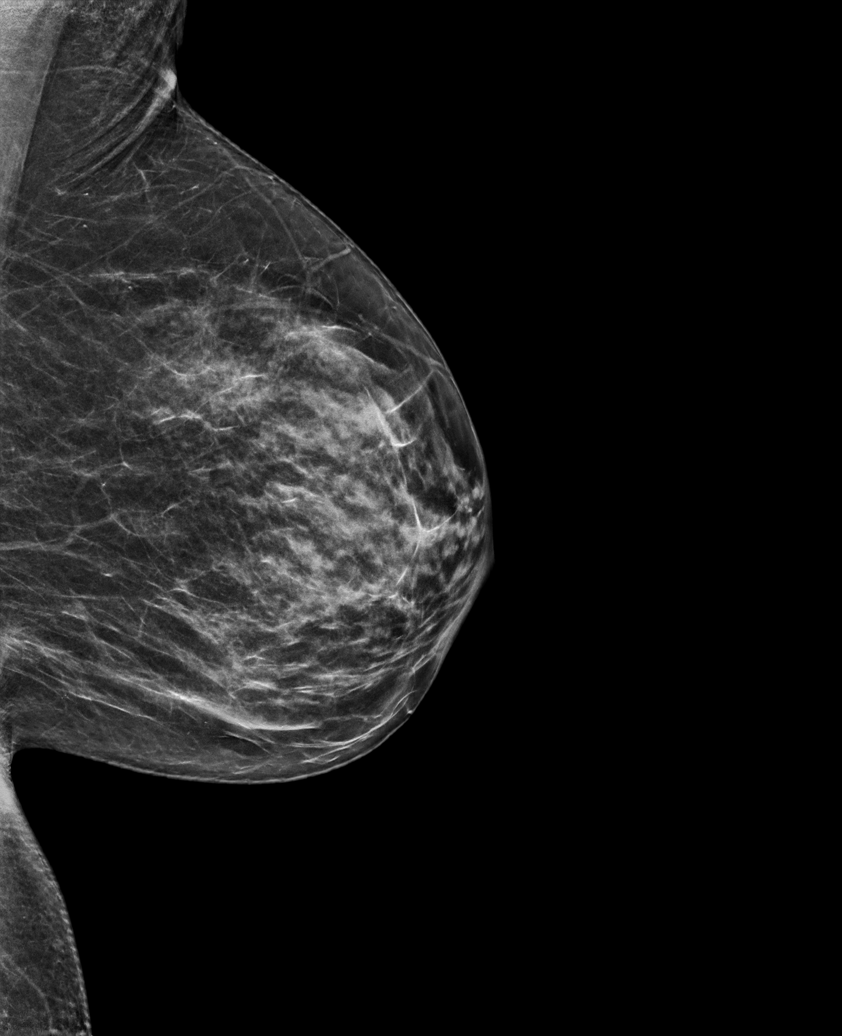

[6 of 36 positions shown; findings below may reference images not displayed]

ACR Breast Density Category c: The breast tissue is heterogeneously
dense, which may obscure small masses.
FINDINGS: There are no findings suspicious for malignancy.
IMPRESSION: No mammographic evidence of malignancy. A result letter of this
screening mammogram will be mailed directly to the patient.

RECOMMENDATION:
Screening mammogram in one year. (Code:Q3-W-BC3)

BI-RADS CATEGORY  1: Negative.

## 2024-01-27 ENCOUNTER — Ambulatory Visit: Payer: Self-pay

## 2024-01-29 ENCOUNTER — Ambulatory Visit
Admission: RE | Admit: 2024-01-29 | Discharge: 2024-01-29 | Disposition: A | Discharge status: Home / Self Care | Payer: PRIVATE HEALTH INSURANCE | Source: Ambulatory Visit | Attending: Primary Care | Admitting: Primary Care

## 2024-01-29 DIAGNOSIS — R928 Other abnormal and inconclusive findings on diagnostic imaging of breast: Secondary | ICD-10-CM | POA: Diagnosis present

## 2024-02-02 ENCOUNTER — Ambulatory Visit: Payer: Self-pay | Admitting: Primary Care

## 2024-02-26 ENCOUNTER — Telehealth: Payer: Self-pay

## 2024-02-26 NOTE — Telephone Encounter (Signed)
 Spoke with patient, she stated she has documentation in a message from Dr. Antony Baumgartner [facility] clinic) stating she needs the HEP A and HEP B vaccines. Patient will send this information in a MyChart message to [facility] for review. States she has not seen him in person recently, had some imaging done and that is how she was diagnosed.

## 2024-02-26 NOTE — Telephone Encounter (Signed)
 I do not see any recent GI documentation, including in Care Everywhere. I would like to receive confirmation from her GI doctor regarding this request.  Does she have any paperwork from a recent visit?  If not, no need to get in touch with her GI doctor to confirm that she needs these vaccines.

## 2024-02-26 NOTE — Telephone Encounter (Signed)
 Copied from CRM [id number]. Topic: Clinical - Request for Lab/Test Order >> Feb 26, 2024  3:15 PM Virginia Bird wrote: Reason for CRM: Pt would like orders for Hep A and B vaccine as she was recently dx with crohnes disease.   Pt callback is [phone]

## 2024-02-26 NOTE — Telephone Encounter (Signed)
 Noted

## 2024-02-28 NOTE — Telephone Encounter (Signed)
 Virginia Bird, per GI she needs Hepatitis A and B vaccines. Can you get her set up with nurse visits?

## 2024-02-29 NOTE — Telephone Encounter (Signed)
 lvm for pt to call office to schedule appt.

## 2024-03-04 NOTE — Telephone Encounter (Signed)
 Pt called back and I schedule appt

## 2024-03-08 ENCOUNTER — Ambulatory Visit (INDEPENDENT_AMBULATORY_CARE_PROVIDER_SITE_OTHER): Payer: PRIVATE HEALTH INSURANCE

## 2024-03-08 DIAGNOSIS — Z23 Encounter for immunization: Secondary | ICD-10-CM | POA: Diagnosis not present

## 2024-03-08 NOTE — Progress Notes (Signed)
 Per orders of Mallie Gaskins, DPN AGNP-C, injection of Twinrix given by Laray Arenas in right deltoid. Patient tolerated injection well.

## 2024-03-22 ENCOUNTER — Inpatient Hospital Stay: Payer: PRIVATE HEALTH INSURANCE | Admitting: Oncology

## 2024-03-22 ENCOUNTER — Other Ambulatory Visit: Payer: PRIVATE HEALTH INSURANCE

## 2024-03-22 ENCOUNTER — Inpatient Hospital Stay: Payer: PRIVATE HEALTH INSURANCE

## 2024-04-04 ENCOUNTER — Encounter: Payer: Self-pay | Admitting: Oncology

## 2024-04-05 ENCOUNTER — Ambulatory Visit: Payer: PRIVATE HEALTH INSURANCE | Admitting: Primary Care

## 2024-04-12 ENCOUNTER — Ambulatory Visit (INDEPENDENT_AMBULATORY_CARE_PROVIDER_SITE_OTHER): Payer: PRIVATE HEALTH INSURANCE

## 2024-04-12 DIAGNOSIS — Z23 Encounter for immunization: Secondary | ICD-10-CM | POA: Diagnosis not present

## 2024-04-12 NOTE — Progress Notes (Signed)
 Patient seen for nurse visit for Twinrix vaccine.  Vaccine given in left deltoid.  Pt tolerated injection well.

## 2024-04-15 ENCOUNTER — Ambulatory Visit: Payer: PRIVATE HEALTH INSURANCE | Admitting: Primary Care

## 2024-04-15 ENCOUNTER — Encounter: Payer: Self-pay | Admitting: Primary Care

## 2024-04-15 VITALS — BP 126/82 | HR 102 | Temp 97.2°F | Ht 61.75 in | Wt 155.0 lb

## 2024-04-15 DIAGNOSIS — R350 Frequency of micturition: Secondary | ICD-10-CM

## 2024-04-15 DIAGNOSIS — N898 Other specified noninflammatory disorders of vagina: Secondary | ICD-10-CM | POA: Diagnosis not present

## 2024-04-15 DIAGNOSIS — R3129 Other microscopic hematuria: Secondary | ICD-10-CM | POA: Diagnosis not present

## 2024-04-15 LAB — URINALYSIS, MICROSCOPIC ONLY

## 2024-04-15 LAB — POC URINALSYSI DIPSTICK (AUTOMATED)
Bilirubin, UA: NEGATIVE
Blood, UA: POSITIVE
Glucose, UA: NEGATIVE
Ketones, UA: NEGATIVE
Leukocytes, UA: NEGATIVE
Nitrite, UA: NEGATIVE
Protein, UA: POSITIVE — AB
Spec Grav, UA: 1.025 (ref 1.010–1.025)
Urobilinogen, UA: 0.2 U/dL
pH, UA: 5 (ref 5.0–8.0)

## 2024-04-15 NOTE — Patient Instructions (Signed)
 We will be in touch with your test results soon.  It was a pleasure to see you today!

## 2024-04-15 NOTE — Assessment & Plan Note (Signed)
 Vaginal exam mostly benign. We had a discussion regarding chronic BV, when and when not to treat.  Wet prep collected and pending. Await results

## 2024-04-15 NOTE — Progress Notes (Signed)
 Subjective:    Patient ID: Francee LITTIE Dimes, female    DOB: 03-Feb-1981, 43 y.o.   MRN: [id number]  HPI  Alexandr Oehler Oetken is a very pleasant 43 y.o. female with a history of Crohn's disease, multiple food allergies, bacterial vaginosis, acute cystitis who presents today to discuss blood pressure.  Symptom onset 3 to 4 days ago with bladder pressure, urinary frequency, vaginal itching, and urinary urgency. She questions if she has bacterial vaginosis infection.   She denies vaginal discharge, hematuria, fevers. She does have abdominal bloating but isn't sure if this is a Crohn's flare. She meets with GI next week.    Review of Systems  Constitutional:  Negative for chills and fever.  Gastrointestinal:  Negative for abdominal pain and nausea.  Genitourinary:  Positive for frequency. Negative for dysuria, hematuria and vaginal discharge.         Past Medical History:  Diagnosis Date   Abnormal Pap smear of cervix    Anemia    Chickenpox    COVID-19 virus infection 10/05/2020   Infectious enteritis 06/01/2023   Pelvic pain 01/06/2024   Pneumonia of both lungs due to infectious organism 06/07/2023   Sepsis with acute organ dysfunction without septic shock (HCC) 06/08/2023   Status post hemorrhoidectomy 11/21/2021   UTI (urinary tract infection)     Social History   Socioeconomic History   Marital status: Single    Spouse name: Not on file   Number of children: 1   Years of education: Not on file   Highest education level: Not on file  Occupational History   Not on file  Tobacco Use   Smoking status: Every Day    Current packs/day: 0.25    Average packs/day: 0.3 packs/day for 10.0 years (2.5 ttl pk-yrs)    Types: Cigarettes   Smokeless tobacco: Never   Tobacco comments:    Has not tried any quit aids/tn  Vaping Use   Vaping status: Never Used  Substance and Sexual Activity   Alcohol use: Yes    Comment: daily cocktail   Drug use: No   Sexual activity: Yes     Partners: Male  Other Topics Concern   Not on file  Social History Narrative   Single.    1 daughter.    Works at [employer].   Enjoys going to [employer].    Social Drivers of Corporate investment banker Strain: Not on file  Food Insecurity: No Food Insecurity (06/09/2023)   Hunger Vital Sign    Worried About Running Out of Food in the Last Year: Never true    Ran Out of Food in the Last Year: Never true  Transportation Needs: No Transportation Needs (06/09/2023)   PRAPARE - Administrator, Civil Service (Medical): No    Lack of Transportation (Non-Medical): No  Physical Activity: Not on file  Stress: Not on file  Social Connections: Not on file  Intimate Partner Violence: Not At Risk (06/01/2023)   Humiliation, Afraid, Rape, and Kick questionnaire    Fear of Current or Ex-Partner: No    Emotionally Abused: No    Physically Abused: No    Sexually Abused: No    Past Surgical History:  Procedure Laterality Date   COLONOSCOPY WITH PROPOFOL  N/A 12/10/2023   Procedure: COLONOSCOPY WITH PROPOFOL ;  Surgeon: Therisa Bi, MD;  Location: [facility] ENDOSCOPY;  Service: Gastroenterology;  Laterality: N/A;   DILATION AND CURETTAGE OF UTERUS  09/15/2000   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   ESOPHAGOGASTRODUODENOSCOPY (EGD) WITH PROPOFOL  N/A 12/10/2023   Procedure: ESOPHAGOGASTRODUODENOSCOPY (EGD) WITH PROPOFOL ;  Surgeon: Therisa Bi, MD;  Location: [facility] ENDOSCOPY;  Service: Gastroenterology;  Laterality: N/A;   HEMORRHOID SURGERY N/A 11/08/2021   Procedure: HEMORRHOIDECTOMY;  Surgeon: Lane Shope, MD;  Location: [facility] ORS;  Service: General;  Laterality: N/A;   RECTAL EXAM UNDER ANESTHESIA N/A 11/08/2021   Procedure: RECTAL EXAM UNDER ANESTHESIA;  Surgeon: Lane Shope, MD;  Location: [facility] ORS;  Service: General;  Laterality: N/A;   WISDOM TOOTH EXTRACTION      Family History  Problem Relation Age of Onset   Thyroid  disease Mother    Hypertension  Mother    Cancer Father        leukemia   Stroke Father    Cancer Maternal Aunt        Breast Cancer   Breast cancer Maternal Aunt    Asthma Sister    Miscarriages / Stillbirths Sister     No Known Allergies  Current Outpatient Medications on File Prior to Visit  Medication Sig Dispense Refill   azelastine  (ASTELIN ) 0.1 % nasal spray Place 2 sprays into both nostrils 2 (two) times daily. Use in each nostril as directed 30 mL 12   EPINEPHrine  0.3 mg/0.3 mL IJ SOAJ injection Inject 0.3 mg into the muscle as needed for anaphylaxis. 1 each 0   famotidine  (PEPCID ) 20 MG tablet TAKE 1 TABLET (20 MG TOTAL) BY MOUTH DAILY. FOR HEARTBURN. 90 tablet 2   fluticasone  (FLONASE ) 50 MCG/ACT nasal spray PLACE 1 SPRAY INTO BOTH NOSTRILS 2 (TWO) TIMES DAILY AS NEEDED FOR ALLERGIES OR RHINITIS. 48 mL 0   No current facility-administered medications on file prior to visit.    BP 126/82   Pulse (!) 102   Temp (!) 97.2 F (36.2 C) (Temporal)   Ht 5' 1.75 (1.568 m)   Wt 155 lb (70.3 kg)   LMP 04/03/2024 (Exact Date)   SpO2 99%   BMI 28.58 kg/m  Objective:   Physical Exam Exam conducted with a chaperone present.  Cardiovascular:     Rate and Rhythm: Normal rate.  Pulmonary:     Effort: Pulmonary effort is normal.  Genitourinary:    Labia:        Right: No rash or lesion.        Left: No rash or lesion.      Vagina: Vaginal discharge present.     Cervix: No discharge or erythema.     Comments: Scant amount of light green discharge Skin:    General: Skin is warm and dry.  Neurological:     Mental Status: She is alert.           Assessment & Plan:  Urinary frequency Assessment & Plan: UA today with + blood, otherwise negative. Urine microscopic and culture pending.  Await results.   Orders: -     POCT Urinalysis Dipstick (Automated) -     Urine Culture  Vaginal itching Assessment & Plan: Vaginal exam mostly benign. We had a discussion regarding chronic BV, when and when  not to treat.  Wet prep collected and pending. Await results   Orders: -     WET PREP BY MOLECULAR PROBE  Microscopic hematuria -     Urine Microscopic        Comer MARLA Gaskins, NP

## 2024-04-15 NOTE — Assessment & Plan Note (Addendum)
 UA today with + blood, otherwise negative. Urine microscopic and culture pending.  Await results.

## 2024-04-17 ENCOUNTER — Ambulatory Visit: Payer: Self-pay | Admitting: Primary Care

## 2024-04-17 DIAGNOSIS — R3129 Other microscopic hematuria: Secondary | ICD-10-CM

## 2024-04-17 DIAGNOSIS — B9689 Other specified bacterial agents as the cause of diseases classified elsewhere: Secondary | ICD-10-CM

## 2024-04-18 LAB — WET PREP BY MOLECULAR PROBE
Candida species: NOT DETECTED
MICRO NUMBER:: 16776153
SPECIMEN QUALITY:: ADEQUATE
Trichomonas vaginosis: NOT DETECTED

## 2024-04-18 LAB — URINE CULTURE
MICRO NUMBER:: 16776154
Result:: NO GROWTH
SPECIMEN QUALITY:: ADEQUATE

## 2024-04-19 MED ORDER — METRONIDAZOLE 500 MG PO TABS
500.0000 mg | ORAL_TABLET | Freq: Two times a day (BID) | ORAL | 0 refills | Status: AC
Start: 2024-04-19 — End: 2024-04-26

## 2024-05-12 ENCOUNTER — Telehealth: Payer: Self-pay | Admitting: Primary Care

## 2024-05-12 NOTE — Telephone Encounter (Signed)
 Copied from CRM #[id number]. Topic: Appointments - Scheduling Inquiry for Clinic >> May 12, 2024  1:20 PM Virginia Bird wrote: Reason for CRM: Patient is calling for a shingles infection and also a pneumonia injection recommended by her GI doctor. Please advise the patient.

## 2024-05-12 NOTE — Telephone Encounter (Signed)
 Please advise if patient is due for any vaccinations and if okay to schedule NV

## 2024-05-13 NOTE — Telephone Encounter (Signed)
 Shingles vaccine is not recommended until age 45, but I can see from her GI doctor's notes that she may be started on medication for her Crohn's? Is this true? What medication?  Okay to proceed with Prevnar 20 pneumonia vaccine in our office. Since she is <50 she may want to get the Shingrix series at her pharmacy for better cost if her GI doctor recommended it.

## 2024-05-13 NOTE — Telephone Encounter (Signed)
 Called and advised patient of Meredith message.  Patient is starting Skyrizi for [employer] treatment next week.   Patient preferred to get both vaccines at the pharmacy at the same time.

## 2024-05-14 NOTE — Telephone Encounter (Signed)
 Noted

## 2024-05-19 ENCOUNTER — Inpatient Hospital Stay (HOSPITAL_BASED_OUTPATIENT_CLINIC_OR_DEPARTMENT_OTHER): Payer: PRIVATE HEALTH INSURANCE | Admitting: Oncology

## 2024-05-19 ENCOUNTER — Encounter: Payer: Self-pay | Admitting: Oncology

## 2024-05-19 ENCOUNTER — Inpatient Hospital Stay: Payer: PRIVATE HEALTH INSURANCE | Attending: Oncology

## 2024-05-19 ENCOUNTER — Inpatient Hospital Stay: Payer: PRIVATE HEALTH INSURANCE

## 2024-05-19 VITALS — BP 124/79 | HR 87 | Temp 98.0°F | Resp 18 | Ht 62.0 in | Wt 156.0 lb

## 2024-05-19 DIAGNOSIS — F17210 Nicotine dependence, cigarettes, uncomplicated: Secondary | ICD-10-CM | POA: Insufficient documentation

## 2024-05-19 DIAGNOSIS — K5090 Crohn's disease, unspecified, without complications: Secondary | ICD-10-CM | POA: Insufficient documentation

## 2024-05-19 DIAGNOSIS — D509 Iron deficiency anemia, unspecified: Secondary | ICD-10-CM

## 2024-05-19 DIAGNOSIS — R5382 Chronic fatigue, unspecified: Secondary | ICD-10-CM | POA: Diagnosis not present

## 2024-05-19 DIAGNOSIS — Z803 Family history of malignant neoplasm of breast: Secondary | ICD-10-CM | POA: Insufficient documentation

## 2024-05-19 DIAGNOSIS — N920 Excessive and frequent menstruation with regular cycle: Secondary | ICD-10-CM | POA: Insufficient documentation

## 2024-05-19 DIAGNOSIS — Z806 Family history of leukemia: Secondary | ICD-10-CM | POA: Diagnosis not present

## 2024-05-19 LAB — IRON AND TIBC
Iron: 21 ug/dL — ABNORMAL LOW (ref 28–170)
Saturation Ratios: 5 % — ABNORMAL LOW (ref 10.4–31.8)
TIBC: 396 ug/dL (ref 250–450)
UIBC: 375 ug/dL

## 2024-05-19 LAB — CBC WITH DIFFERENTIAL/PLATELET
Abs Immature Granulocytes: 0.02 K/uL (ref 0.00–0.07)
Basophils Absolute: 0.1 K/uL (ref 0.0–0.1)
Basophils Relative: 1 %
Eosinophils Absolute: 0.3 K/uL (ref 0.0–0.5)
Eosinophils Relative: 5 %
HCT: 32.7 % — ABNORMAL LOW (ref 36.0–46.0)
Hemoglobin: 10.1 g/dL — ABNORMAL LOW (ref 12.0–15.0)
Immature Granulocytes: 0 %
Lymphocytes Relative: 19 %
Lymphs Abs: 1.3 K/uL (ref 0.7–4.0)
MCH: 23.8 pg — ABNORMAL LOW (ref 26.0–34.0)
MCHC: 30.9 g/dL (ref 30.0–36.0)
MCV: 76.9 fL — ABNORMAL LOW (ref 80.0–100.0)
Monocytes Absolute: 0.7 K/uL (ref 0.1–1.0)
Monocytes Relative: 10 %
Neutro Abs: 4.4 K/uL (ref 1.7–7.7)
Neutrophils Relative %: 65 %
Platelets: 353 K/uL (ref 150–400)
RBC: 4.25 MIL/uL (ref 3.87–5.11)
RDW: 14.6 % (ref 11.5–15.5)
WBC: 6.8 K/uL (ref 4.0–10.5)
nRBC: 0 % (ref 0.0–0.2)

## 2024-05-19 LAB — FERRITIN: Ferritin: 5 ng/mL — ABNORMAL LOW (ref 11–307)

## 2024-05-19 NOTE — Progress Notes (Signed)
 Unable to get IV access after 3 attempts today for Venofer . Patient would like for Dr. Jacobo and team to let her know if she needs to return for Venofer  after her labs are resulted. Marjorie Parkin, RN aware.

## 2024-05-19 NOTE — Progress Notes (Signed)
 [facility]  [phone]) [phone] [phone]) [phone]  ID: Virginia Bird OB: [birth year]  MR#: [id number]  RDW#:[id number]  Patient Care Team: Gretta Comer POUR, NP as PCP - General (Internal Medicine) Herchel Gloris LABOR, MD as Attending Physician (Obstetrics and Gynecology) Jacobo Virginia PARAS, MD as Consulting Physician (Oncology)   CHIEF COMPLAINT: Iron  deficiency anemia.  INTERVAL HISTORY: Patient returns to clinic today for repeat laboratory, further evaluation, and consideration of additional IV Venofer .  She reports she was recently diagnosed with Crohn's but has not yet started treatment.  She has chronic fatigue, but otherwise feels well.  She has no neurologic complaints.  She denies any recent fevers. She has a good appetite and denies weight loss.  She has no chest pain, shortness of breath, cough, or hemoptysis.  She denies any nausea, vomiting, constipation, or diarrhea.  She has no melena or hematochezia.  She has no urinary complaints.  Patient offers no further specific complaints today.  REVIEW OF SYSTEMS:   Review of Systems  Constitutional:  Positive for malaise/fatigue. Negative for fever and weight loss.  Respiratory: Negative.  Negative for cough and shortness of breath.   Cardiovascular: Negative.  Negative for chest pain and leg swelling.  Gastrointestinal: Negative.  Negative for abdominal pain, blood in stool and melena.  Genitourinary: Negative.  Negative for hematuria.  Musculoskeletal: Negative.  Negative for back pain.  Skin: Negative.  Negative for rash.  Neurological: Negative.  Negative for dizziness, focal weakness, weakness and headaches.  Psychiatric/Behavioral: Negative.  The patient is not nervous/anxious.     As per HPI. Otherwise, a complete review of systems is negative.  PAST MEDICAL HISTORY: Past Medical History:  Diagnosis Date   Abnormal Pap smear of cervix    Anemia    Chickenpox    COVID-19 virus infection  10/05/2020   Infectious enteritis 06/01/2023   Pelvic pain 01/06/2024   Pneumonia of both lungs due to infectious organism 06/07/2023   Sepsis with acute organ dysfunction without septic shock (HCC) 06/08/2023   Status post hemorrhoidectomy 11/21/2021   UTI (urinary tract infection)     PAST SURGICAL HISTORY: Past Surgical History:  Procedure Laterality Date   COLONOSCOPY WITH PROPOFOL  N/A 12/10/2023   Procedure: COLONOSCOPY WITH PROPOFOL ;  Surgeon: Therisa Bi, MD;  Location: [facility] ENDOSCOPY;  Service: Gastroenterology;  Laterality: N/A;   DILATION AND CURETTAGE OF UTERUS  09/15/2000   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   ESOPHAGOGASTRODUODENOSCOPY (EGD) WITH PROPOFOL  N/A 12/10/2023   Procedure: ESOPHAGOGASTRODUODENOSCOPY (EGD) WITH PROPOFOL ;  Surgeon: Therisa Bi, MD;  Location: [facility] ENDOSCOPY;  Service: Gastroenterology;  Laterality: N/A;   HEMORRHOID SURGERY N/A 11/08/2021   Procedure: HEMORRHOIDECTOMY;  Surgeon: Lane Shope, MD;  Location: [facility] ORS;  Service: General;  Laterality: N/A;   RECTAL EXAM UNDER ANESTHESIA N/A 11/08/2021   Procedure: RECTAL EXAM UNDER ANESTHESIA;  Surgeon: Lane Shope, MD;  Location: [facility] ORS;  Service: General;  Laterality: N/A;   WISDOM TOOTH EXTRACTION      FAMILY HISTORY: Family History  Problem Relation Age of Onset   Thyroid  disease Mother    Hypertension Mother    Cancer Father        leukemia   Stroke Father    Cancer Maternal Aunt        Breast Cancer   Breast cancer Maternal Aunt    Asthma Sister    Miscarriages / Stillbirths Sister     ADVANCED DIRECTIVES (Y/N):  N  HEALTH MAINTENANCE: Social  History   Tobacco Use   Smoking status: Every Day    Current packs/day: 0.25    Average packs/day: 0.3 packs/day for 10.0 years (2.5 ttl pk-yrs)    Types: Cigarettes   Smokeless tobacco: Never   Tobacco comments:    Has not tried any quit aids/tn  Vaping Use   Vaping status: Never Used  Substance Use  Topics   Alcohol use: Yes    Comment: daily cocktail   Drug use: No     Colonoscopy:  PAP:  Bone density:  Lipid panel:  No Known Allergies  Current Outpatient Medications  Medication Sig Dispense Refill   azelastine  (ASTELIN ) 0.1 % nasal spray Place 2 sprays into both nostrils 2 (two) times daily. Use in each nostril as directed 30 mL 12   EPINEPHrine  0.3 mg/0.3 mL IJ SOAJ injection Inject 0.3 mg into the muscle as needed for anaphylaxis. 1 each 0   famotidine  (PEPCID ) 20 MG tablet TAKE 1 TABLET (20 MG TOTAL) BY MOUTH DAILY. FOR HEARTBURN. 90 tablet 2   fluticasone  (FLONASE ) 50 MCG/ACT nasal spray PLACE 1 SPRAY INTO BOTH NOSTRILS 2 (TWO) TIMES DAILY AS NEEDED FOR ALLERGIES OR RHINITIS. 48 mL 0   No current facility-administered medications for this visit.    OBJECTIVE: Vitals:   05/19/24 0835  BP: 124/79  Pulse: 87  Resp: 18  Temp: 98 F (36.7 C)  SpO2: 100%      Body mass index is 28.53 kg/m.    ECOG FS:0 - Asymptomatic  General: Well-developed, well-nourished, no acute distress. Eyes: Pink conjunctiva, anicteric sclera. HEENT: Normocephalic, moist mucous membranes. Lungs: No audible wheezing or coughing. Heart: Regular rate and rhythm. Abdomen: Soft, nontender, no obvious distention. Musculoskeletal: No edema, cyanosis, or clubbing. Neuro: Alert, answering all questions appropriately. Cranial nerves grossly intact. Skin: No rashes or petechiae noted. Psych: Normal affect.  LAB RESULTS:  Lab Results  Component Value Date   NA 140 01/15/2024   K 3.9 01/15/2024   CL 106 01/15/2024   CO2 21 01/15/2024   GLUCOSE 116 (H) 01/15/2024   BUN 11 01/15/2024   CREATININE 0.73 01/15/2024   CALCIUM 8.8 01/15/2024   PROT 6.7 01/15/2024   ALBUMIN 4.0 01/15/2024   AST 16 01/15/2024   ALT 12 01/15/2024   ALKPHOS 55 01/15/2024   BILITOT <0.2 01/15/2024   GFRNONAA >60 06/08/2023   GFRAA  04/11/2009    >60        The eGFR has been calculated using the MDRD  equation. This calculation has not been validated in all clinical situations. eGFR's persistently <60 mL/min signify possible Chronic Kidney Disease.    Lab Results  Component Value Date   WBC 6.8 05/19/2024   NEUTROABS 4.4 05/19/2024   HGB 10.1 (L) 05/19/2024   HCT 32.7 (L) 05/19/2024   MCV 76.9 (L) 05/19/2024   PLT 353 05/19/2024   Lab Results  Component Value Date   IRON  20 (L) 11/02/2023   TIBC 396 11/02/2023   IRONPCTSAT 5 (LL) 11/02/2023   Lab Results  Component Value Date   FERRITIN 9 (L) 11/02/2023     STUDIES: No results found.  ASSESSMENT: Iron  deficiency anemia.  PLAN:    Iron  deficiency anemia: Likely secondary to fibroids and heavy menstrual bleeding.  Patient also recently diagnosed with Crohn's disease which may be contributing.  Patient's hemoglobin is decreased, but essentially stable at 10.1.  Iron  stores are pending at time of dictation.  Proceed with 200 mg IV Venofer  today.  If iron  stores results persistently low, patient will be scheduled for additional IV Venofer .  If her iron  stores have improved, she will not require additional treatments and return to clinic in 4 months with repeat laboratory work, further evaluation, and consideration of additional iron  if necessary.   Heavy menses: By report patient has fibroids. Continue follow-up with OB/GYN as scheduled. Chron's disease: Patient reports she has not started treatment yet.  Continue follow-up with GI as scheduled.  I spent a total of 30 minutes reviewing chart data, face-to-face evaluation with the patient, counseling and coordination of care as detailed above.   Patient expressed understanding and was in agreement with this plan. She also understands that She can call clinic at any time with any questions, concerns, or complaints.    Virginia JINNY Reusing, MD   05/19/2024 9:10 AM

## 2024-05-19 NOTE — Progress Notes (Signed)
 Patient is doing well, no new questions for the doctor today.

## 2024-05-23 ENCOUNTER — Other Ambulatory Visit: Payer: PRIVATE HEALTH INSURANCE

## 2024-05-24 ENCOUNTER — Ambulatory Visit: Payer: PRIVATE HEALTH INSURANCE

## 2024-05-31 ENCOUNTER — Ambulatory Visit: Payer: PRIVATE HEALTH INSURANCE

## 2024-05-31 DIAGNOSIS — Z23 Encounter for immunization: Secondary | ICD-10-CM | POA: Diagnosis not present

## 2024-05-31 NOTE — Progress Notes (Signed)
 Per orders of Mallie Gaskins, DPN AGNP-C, injection of prevnar 20  given in right deltoid and shingrix   given by Laray Arenas in left deltoid. Patient tolerated injection well. Patient will make appointment for 6 month.

## 2024-06-01 ENCOUNTER — Inpatient Hospital Stay: Payer: PRIVATE HEALTH INSURANCE

## 2024-06-01 VITALS — BP 112/81 | HR 91 | Temp 97.0°F | Resp 16

## 2024-06-01 DIAGNOSIS — D509 Iron deficiency anemia, unspecified: Secondary | ICD-10-CM

## 2024-06-01 MED ORDER — IRON SUCROSE 20 MG/ML IV SOLN
200.0000 mg | Freq: Once | INTRAVENOUS | Status: AC
  Administered 2024-06-01: 200 mg via INTRAVENOUS
  Filled 2024-06-01: qty 10

## 2024-06-01 NOTE — Progress Notes (Signed)
 Declined post-observation. Aware of risks. Vitals stable at discharge.

## 2024-06-01 NOTE — Patient Instructions (Signed)

## 2024-06-03 ENCOUNTER — Inpatient Hospital Stay: Payer: PRIVATE HEALTH INSURANCE

## 2024-06-03 VITALS — BP 114/75 | HR 100 | Temp 96.0°F | Resp 17

## 2024-06-03 DIAGNOSIS — D509 Iron deficiency anemia, unspecified: Secondary | ICD-10-CM | POA: Diagnosis not present

## 2024-06-03 MED ORDER — IRON SUCROSE 20 MG/ML IV SOLN
200.0000 mg | Freq: Once | INTRAVENOUS | Status: AC
  Administered 2024-06-03: 200 mg via INTRAVENOUS
  Filled 2024-06-03: qty 10

## 2024-06-07 ENCOUNTER — Inpatient Hospital Stay: Payer: PRIVATE HEALTH INSURANCE

## 2024-06-07 VITALS — BP 108/79 | HR 91 | Temp 96.9°F | Resp 16

## 2024-06-07 DIAGNOSIS — D509 Iron deficiency anemia, unspecified: Secondary | ICD-10-CM | POA: Diagnosis not present

## 2024-06-07 MED ORDER — IRON SUCROSE 20 MG/ML IV SOLN
200.0000 mg | Freq: Once | INTRAVENOUS | Status: AC
  Administered 2024-06-07: 200 mg via INTRAVENOUS
  Filled 2024-06-07: qty 10

## 2024-06-09 ENCOUNTER — Inpatient Hospital Stay: Payer: PRIVATE HEALTH INSURANCE

## 2024-06-09 ENCOUNTER — Other Ambulatory Visit: Payer: Self-pay | Admitting: Primary Care

## 2024-06-09 VITALS — BP 116/76 | HR 93 | Resp 16

## 2024-06-09 DIAGNOSIS — D509 Iron deficiency anemia, unspecified: Secondary | ICD-10-CM | POA: Diagnosis not present

## 2024-06-09 DIAGNOSIS — K219 Gastro-esophageal reflux disease without esophagitis: Secondary | ICD-10-CM

## 2024-06-09 MED ORDER — IRON SUCROSE 20 MG/ML IV SOLN
200.0000 mg | Freq: Once | INTRAVENOUS | Status: AC
  Administered 2024-06-09: 200 mg via INTRAVENOUS
  Filled 2024-06-09: qty 10

## 2024-06-09 MED ORDER — FAMOTIDINE 20 MG PO TABS: 20.0000 mg | ORAL_TABLET | Freq: Every day | ORAL | 1 refills | Status: AC

## 2024-06-09 NOTE — Telephone Encounter (Signed)
 Copied from CRM [id number]. Topic: Clinical - Medication Refill >> Jun 09, 2024  2:27 PM Leah C wrote: Medication: famotidine  (PEPCID ) 20 MG tablet  Has the patient contacted their pharmacy? Yes  (Agent: If no, request that the patient contact the pharmacy for the refill. If patient does not wish to contact the pharmacy document the reason why and proceed with request.) (Agent: If yes, when and what did the pharmacy advise?)  This is the patient's preferred pharmacy:  [phone] [place] - [address] [place] [address] Phone: [phone] Fax: [phone]   Is this the correct pharmacy for this prescription? Yes If no, delete pharmacy and type the correct one.   Has the prescription been filled recently? Yes  Is the patient out of the medication? Yes  Has the patient been seen for an appointment in the last year OR does the patient have an upcoming appointment? Yes  Can we respond through MyChart? Yes  Agent: Please be advised that Rx refills may take up to 3 business days. We ask that you follow-up with your pharmacy.

## 2024-06-09 NOTE — Patient Instructions (Signed)

## 2024-06-23 ENCOUNTER — Encounter: Payer: Self-pay | Admitting: Oncology

## 2024-08-15 ENCOUNTER — Telehealth: Payer: Self-pay | Admitting: Oncology

## 2024-08-15 ENCOUNTER — Other Ambulatory Visit: Payer: Self-pay | Admitting: Primary Care

## 2024-08-15 DIAGNOSIS — N63 Unspecified lump in unspecified breast: Secondary | ICD-10-CM

## 2024-08-15 NOTE — Telephone Encounter (Signed)
 Pt was scheduled for appts in January. Due to work, pt needed December appts. Appts are r/s and new date/time confirmed with pt

## 2024-08-22 ENCOUNTER — Inpatient Hospital Stay: Admission: RE | Admit: 2024-08-22 | Discharge: 2024-08-22 | Payer: PRIVATE HEALTH INSURANCE | Attending: Primary Care

## 2024-08-22 ENCOUNTER — Ambulatory Visit: Payer: Self-pay

## 2024-08-22 DIAGNOSIS — N63 Unspecified lump in unspecified breast: Secondary | ICD-10-CM

## 2024-08-22 NOTE — Telephone Encounter (Signed)
 Virginia Bird - Patient was previously scheduled with Dr. Avelina, since appointment was for medication changes thought it may be best for her to see you.  Called patient and she was only available to come in Wednesday.  Used the 12:20, hospital follow up appointment.  Please let me know if you'd prefer I not do that in the future.

## 2024-08-22 NOTE — Telephone Encounter (Signed)
 FYI Only or Action Required?: FYI only for provider: appointment scheduled on 12/10.  Patient was last seen in primary care on 04/15/2024 by Virginia Comer POUR, NP.  Called Nurse Triage reporting Abdominal Cramping.  Symptoms began several days ago.  Interventions attempted: OTC medications: Tylenol .  Symptoms are: stable.  Triage Disposition: Information or Advice Only Call  Patient/caregiver understands and will follow disposition?: Yes  Copied from CRM [id number]. Topic: Clinical - Red Word Triage >> Aug 22, 2024 12:09 PM Virginia Bird wrote: [facility] that prompted transfer to Nurse Triage: pain cramps Pt called she was directed on my chart to call and make an apt in regards to medication with her pcp. However while trying to schedule her, she says the issue is worsening on DT prompted to NT pt. Pt just needs to schedule an apt. Reason for Disposition  General information question, no triage required and triager able to answer question  Answer Assessment - Initial Assessment Questions 1. REASON FOR CALL: What is the main reason for your call? or How can I best help you?     Calling to get scheduled. Patient with recent diagnosis for Crohn's and unable to take ibuprofen  and Tylenol  not helping her cramps. Patient states that her menstrual cramps, there is no new symptoms but she is looking for an alternative option for pain control and was advised by the CMA to call and schedule.  Appt with PCP office 12/10 to discuss. ED/UC precautions understood.  Protocols used: Information Only Call - No Triage-A-AH

## 2024-08-22 NOTE — Telephone Encounter (Signed)
 Thank you! I have no problem with anyone using that slot for patient care, regardless of the reason for the appointment. Thank you for getting her in!

## 2024-08-22 NOTE — Telephone Encounter (Signed)
 Noted.  Agreed; patient needs to be evaluated in office for crohn's exacerbation.

## 2024-08-23 NOTE — Telephone Encounter (Unsigned)
 Copied from CRM #[id number]. Topic: General - Other >> Aug 23, 2024  3:49 PM Virginia Bird wrote: Reason for CRM: Patient requests a call back from clinic as soon as possible.

## 2024-08-24 ENCOUNTER — Ambulatory Visit: Payer: PRIVATE HEALTH INSURANCE | Admitting: Primary Care

## 2024-08-24 ENCOUNTER — Ambulatory Visit: Payer: PRIVATE HEALTH INSURANCE | Admitting: Family Medicine

## 2024-08-24 NOTE — Telephone Encounter (Signed)
 Lvm returning pt's call. Asked pt to call back.

## 2024-08-25 NOTE — Telephone Encounter (Signed)
 Lvm returning pt's call. Asked pt to call back.

## 2024-08-29 NOTE — Telephone Encounter (Signed)
 Lvm returning pt's call. Asked pt to call back.   Looks like pt is scheduled to see Mallie on 08/31/24.

## 2024-08-31 ENCOUNTER — Ambulatory Visit: Payer: PRIVATE HEALTH INSURANCE | Admitting: Primary Care

## 2024-08-31 VITALS — BP 128/76 | HR 104 | Temp 97.9°F | Ht 61.75 in | Wt 162.2 lb

## 2024-08-31 DIAGNOSIS — N946 Dysmenorrhea, unspecified: Secondary | ICD-10-CM | POA: Insufficient documentation

## 2024-08-31 NOTE — Progress Notes (Signed)
 Subjective:    Patient ID: Virginia Bird, female    DOB: [birth year], 43 y.o.   MRN: [id number]  Virginia Bird is a very pleasant 43 y.o. female with history of Crohn's disease, high deficiency anemia, menorrhagia with regular cycle who presents today to discuss dysmenorrhea.  Chronic history of menorrhagia with regular cycles. She began her Skyrizi infusions in September and has completed 3 infusions which has helped with menstrual bleeding overall. During menses she has very painful cramping. Her cramping is most severe during the first  3 days of menses. She has been taking Aleve  once to twice daily x 3 days which helps well.   She's taken Tylenol  without improvement. She cannot take recurrent NSAID medications due to Crohn's history. She denies rectal bleeding, dark tarry stools. She was told by her GI doctor to discuss other options.    Review of Systems  Gastrointestinal:  Negative for blood in stool, diarrhea and rectal pain.  Genitourinary:  Positive for menstrual problem.         Past Medical History:  Diagnosis Date   Abnormal Pap smear of cervix    Anemia    Chickenpox    COVID-19 virus infection 10/05/2020   Infectious enteritis 06/01/2023   Pelvic pain 01/06/2024   Pneumonia of both lungs due to infectious organism 06/07/2023   Sepsis with acute organ dysfunction without septic shock (HCC) 06/08/2023   Status post hemorrhoidectomy 11/21/2021   UTI (urinary tract infection)     Social History   Socioeconomic History   Marital status: Single    Spouse name: Not on file   Number of children: 1   Years of education: Not on file   Highest education level: 12th grade  Occupational History   Not on file  Tobacco Use   Smoking status: Every Day    Current packs/day: 0.25    Average packs/day: 0.3 packs/day for 10.0 years (2.5 ttl pk-yrs)    Types: Cigarettes   Smokeless tobacco: Never   Tobacco comments:    Has not tried any quit aids/tn  Vaping Use    Vaping status: Never Used  Substance and Sexual Activity   Alcohol use: Yes    Comment: daily cocktail   Drug use: No   Sexual activity: Yes    Partners: Male  Other Topics Concern   Not on file  Social History Narrative   Single.    1 daughter.    Works at [employer].   Enjoys going to [employer].    Social Drivers of Health   Tobacco Use: High Risk (08/31/2024)   Patient History    Smoking Tobacco Use: Every Day    Smokeless Tobacco Use: Never    Passive Exposure: Not on file  Financial Resource Strain: Low Risk (08/31/2024)   Overall Financial Resource Strain (CARDIA)    Difficulty of Paying Living Expenses: Not hard at all  Food Insecurity: No Food Insecurity (08/31/2024)   Epic    Worried About Programme Researcher, Broadcasting/film/video in the Last Year: Never true    Ran Out of Food in the Last Year: Never true  Transportation Needs: No Transportation Needs (08/31/2024)   Epic    Lack of Transportation (Medical): No    Lack of Transportation (Non-Medical): No  Physical Activity: Not on file  Stress: Not on file  Social Connections: Unknown (08/31/2024)   Social Connection and Isolation Panel    Frequency of Communication with Friends and Family: Once a week  Frequency of Social Gatherings with Friends and Family: Patient declined    Attends Religious Services: Patient declined    Active Member of Clubs or Organizations: No    Attends Engineer, Structural: Not on file    Marital Status: Patient declined  Intimate Partner Violence: Not At Risk (06/01/2023)   Humiliation, Afraid, Rape, and Kick questionnaire    Fear of Current or Ex-Partner: No    Emotionally Abused: No    Physically Abused: No    Sexually Abused: No  Depression (PHQ2-9): Low Risk (06/03/2024)   Depression (PHQ2-9)    PHQ-2 Score: 0  Alcohol Screen: Low Risk (08/31/2024)   Alcohol Screen    Last Alcohol Screening Score (AUDIT): 3  Housing: Unknown (08/31/2024)   Epic    Unable to Pay for Housing  in the Last Year: No    Number of Times Moved in the Last Year: Not on file    Homeless in the Last Year: No  Utilities: Not At Risk (06/09/2023)   [facility] Utilities    Threatened with loss of utilities: No  Health Literacy: Not on file    Past Surgical History:  Procedure Laterality Date   COLONOSCOPY WITH PROPOFOL  N/A 12/10/2023   Procedure: COLONOSCOPY WITH PROPOFOL ;  Surgeon: Therisa Bi, MD;  Location: [facility] ENDOSCOPY;  Service: Gastroenterology;  Laterality: N/A;   DILATION AND CURETTAGE OF UTERUS  09/15/2000   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   ESOPHAGOGASTRODUODENOSCOPY (EGD) WITH PROPOFOL  N/A 12/10/2023   Procedure: ESOPHAGOGASTRODUODENOSCOPY (EGD) WITH PROPOFOL ;  Surgeon: Therisa Bi, MD;  Location: [facility] ENDOSCOPY;  Service: Gastroenterology;  Laterality: N/A;   HEMORRHOID SURGERY N/A 11/08/2021   Procedure: HEMORRHOIDECTOMY;  Surgeon: Lane Shope, MD;  Location: [facility] ORS;  Service: General;  Laterality: N/A;   RECTAL EXAM UNDER ANESTHESIA N/A 11/08/2021   Procedure: RECTAL EXAM UNDER ANESTHESIA;  Surgeon: Lane Shope, MD;  Location: [facility] ORS;  Service: General;  Laterality: N/A;   WISDOM TOOTH EXTRACTION      Family History  Problem Relation Age of Onset   Thyroid  disease Mother    Hypertension Mother    Cancer Father        leukemia   Stroke Father    Cancer Maternal Aunt        Breast Cancer   Breast cancer Maternal Aunt    Asthma Sister    Miscarriages / Stillbirths Sister     Allergies[1]  Medications Ordered Prior to Encounter[2]  BP 128/76   Pulse (!) 104   Temp 97.9 F (36.6 C) (Oral)   Ht 5' 1.75 (1.568 m)   Wt 162 lb 4 oz (73.6 kg)   SpO2 96%   BMI 29.92 kg/m  Objective:   Physical Exam Cardiovascular:     Rate and Rhythm: Normal rate and regular rhythm.  Pulmonary:     Effort: Pulmonary effort is normal.     Breath sounds: Normal breath sounds.  Musculoskeletal:     Cervical back: Neck supple.  Skin:    General:  Skin is warm and dry.  Neurological:     Mental Status: She is alert and oriented to person, place, and time.  Psychiatric:        Mood and Affect: Mood normal.     Physical Exam        Assessment & Plan:  Dysmenorrhea Assessment & Plan: Difficult scenario.   As she is using NSAIDs sparingly for a short period of time, okay to continue  as this is most helpful and to prevent controlled substance use.  We did discuss the risks for taking longer durations. She verbalized understanding.  Add famotidine  20 mg with each Aleve  dose.  She is aware of warning signs of GI bleed.   She will update if anything changes.     Assessment and Plan Assessment & Plan         Comer MARLA Gaskins, NP       [1] No Known Allergies [2]  Current Outpatient Medications on File Prior to Visit  Medication Sig Dispense Refill   azelastine  (ASTELIN ) 0.1 % nasal spray Place 2 sprays into both nostrils 2 (two) times daily. Use in each nostril as directed 30 mL 12   EPINEPHrine  0.3 mg/0.3 mL IJ SOAJ injection Inject 0.3 mg into the muscle as needed for anaphylaxis. 1 each 0   famotidine  (PEPCID ) 20 MG tablet Take 1 tablet (20 mg total) by mouth daily. for heartburn. 90 tablet 1   fluticasone  (FLONASE ) 50 MCG/ACT nasal spray PLACE 1 SPRAY INTO BOTH NOSTRILS 2 (TWO) TIMES DAILY AS NEEDED FOR ALLERGIES OR RHINITIS. 48 mL 0   No current facility-administered medications on file prior to visit.

## 2024-08-31 NOTE — Assessment & Plan Note (Signed)
 Difficult scenario.   As she is using NSAIDs sparingly for a short period of time, okay to continue as this is most helpful and to prevent controlled substance use.  We did discuss the risks for taking longer durations. She verbalized understanding.  Add famotidine  20 mg with each Aleve  dose.  She is aware of warning signs of GI bleed.   She will update if anything changes.

## 2024-08-31 NOTE — Patient Instructions (Addendum)
 Add famotidine  any time you take the Aleve . Make sure to take Aleve  with food.  Limit Aleve  use as discussed.  It was a pleasure to see you today!

## 2024-09-01 ENCOUNTER — Inpatient Hospital Stay: Payer: PRIVATE HEALTH INSURANCE | Attending: Oncology

## 2024-09-01 DIAGNOSIS — D509 Iron deficiency anemia, unspecified: Secondary | ICD-10-CM | POA: Insufficient documentation

## 2024-09-01 LAB — CBC WITH DIFFERENTIAL/PLATELET
Abs Immature Granulocytes: 0.03 K/uL (ref 0.00–0.07)
Basophils Absolute: 0.1 K/uL (ref 0.0–0.1)
Basophils Relative: 1 %
Eosinophils Absolute: 0.4 K/uL (ref 0.0–0.5)
Eosinophils Relative: 6 %
HCT: 34.6 % — ABNORMAL LOW (ref 36.0–46.0)
Hemoglobin: 11.3 g/dL — ABNORMAL LOW (ref 12.0–15.0)
Immature Granulocytes: 1 %
Lymphocytes Relative: 23 %
Lymphs Abs: 1.5 K/uL (ref 0.7–4.0)
MCH: 27.4 pg (ref 26.0–34.0)
MCHC: 32.7 g/dL (ref 30.0–36.0)
MCV: 84 fL (ref 80.0–100.0)
Monocytes Absolute: 0.5 K/uL (ref 0.1–1.0)
Monocytes Relative: 8 %
Neutro Abs: 4.2 K/uL (ref 1.7–7.7)
Neutrophils Relative %: 61 %
Platelets: 313 K/uL (ref 150–400)
RBC: 4.12 MIL/uL (ref 3.87–5.11)
RDW: 14.8 % (ref 11.5–15.5)
WBC: 6.7 K/uL (ref 4.0–10.5)
nRBC: 0 % (ref 0.0–0.2)

## 2024-09-01 LAB — IRON AND TIBC
Iron: 37 ug/dL (ref 28–170)
Saturation Ratios: 11 % (ref 10.4–31.8)
TIBC: 339 ug/dL (ref 250–450)
UIBC: 302 ug/dL

## 2024-09-01 LAB — FERRITIN: Ferritin: 27 ng/mL (ref 11–307)

## 2024-09-02 ENCOUNTER — Inpatient Hospital Stay: Payer: PRIVATE HEALTH INSURANCE

## 2024-09-02 ENCOUNTER — Telehealth: Payer: Self-pay | Admitting: Oncology

## 2024-09-02 ENCOUNTER — Inpatient Hospital Stay: Payer: PRIVATE HEALTH INSURANCE | Admitting: Oncology

## 2024-09-02 NOTE — Telephone Encounter (Signed)
 pt needs to cancel appts for today and will wait for MD to read results to see if she needs another iron  and then she will r/s. Appts for today 12/19 canceled.

## 2024-09-12 ENCOUNTER — Telehealth: Payer: Self-pay | Admitting: Primary Care

## 2024-09-12 NOTE — Telephone Encounter (Signed)
 Copied from CRM [id number]. Topic: Appointments - Scheduling Inquiry for Clinic >> Sep 12, 2024  2:59 PM Berneda FALCON wrote: Reason for CRM: Patient is requesting to schedule for 2nd dose of shingles vaccine.  Patient prefers soonest available appt.  Patient callback is [phone] (home) [phone] (work)

## 2024-09-13 ENCOUNTER — Ambulatory Visit: Payer: Self-pay

## 2024-09-13 DIAGNOSIS — Z23 Encounter for immunization: Secondary | ICD-10-CM | POA: Diagnosis not present

## 2024-09-13 NOTE — Progress Notes (Signed)
 Per orders of Mallie Gaskins, DPN AGNP-C, injection of Shingles  given by Nellie Hummer in Left deltoid Patient tolerated injection well.

## 2024-09-19 ENCOUNTER — Other Ambulatory Visit: Payer: PRIVATE HEALTH INSURANCE

## 2024-09-20 ENCOUNTER — Ambulatory Visit: Payer: PRIVATE HEALTH INSURANCE | Admitting: Oncology

## 2024-09-20 ENCOUNTER — Ambulatory Visit: Payer: PRIVATE HEALTH INSURANCE
